# Patient Record
Sex: Female | Born: 1965 | Race: Black or African American | Hispanic: No | Marital: Single | Smoking: Never smoker
Health system: Southern US, Community
[De-identification: ages and names within clinical notes are randomized; demographics above are authoritative.]

## PROBLEM LIST (undated history)

## (undated) DIAGNOSIS — I341 Nonrheumatic mitral (valve) prolapse: Secondary | ICD-10-CM

## (undated) DIAGNOSIS — K625 Hemorrhage of anus and rectum: Secondary | ICD-10-CM

## (undated) DIAGNOSIS — I10 Essential (primary) hypertension: Secondary | ICD-10-CM

## (undated) DIAGNOSIS — Q613 Polycystic kidney, unspecified: Secondary | ICD-10-CM

## (undated) DIAGNOSIS — N189 Chronic kidney disease, unspecified: Secondary | ICD-10-CM

## (undated) DIAGNOSIS — D219 Benign neoplasm of connective and other soft tissue, unspecified: Secondary | ICD-10-CM

## (undated) DIAGNOSIS — R011 Cardiac murmur, unspecified: Secondary | ICD-10-CM

## (undated) DIAGNOSIS — N289 Disorder of kidney and ureter, unspecified: Secondary | ICD-10-CM

## (undated) HISTORY — PX: CERVIX SURGERY: SHX593

## (undated) HISTORY — DX: Hemorrhage of anus and rectum: K62.5

## (undated) HISTORY — PX: TONSILLECTOMY: SUR1361

## (undated) HISTORY — DX: Chronic kidney disease, unspecified: N18.9

## (undated) HISTORY — DX: Nonrheumatic mitral (valve) prolapse: I34.1

## (undated) HISTORY — PX: APPENDECTOMY: SHX54

## (undated) HISTORY — DX: Disorder of kidney and ureter, unspecified: N28.9

## (undated) HISTORY — PX: DILATION AND CURETTAGE OF UTERUS: SHX78

## (undated) HISTORY — DX: Benign neoplasm of connective and other soft tissue, unspecified: D21.9

## (undated) HISTORY — DX: Cardiac murmur, unspecified: R01.1

---

## 2015-11-20 ENCOUNTER — Emergency Department (HOSPITAL_BASED_OUTPATIENT_CLINIC_OR_DEPARTMENT_OTHER)
Admission: EM | Admit: 2015-11-20 | Discharge: 2015-11-20 | Disposition: A | Discharge status: Home / Self Care | Payer: Medicare Other | Attending: Emergency Medicine | Admitting: Emergency Medicine

## 2015-11-20 ENCOUNTER — Encounter (HOSPITAL_BASED_OUTPATIENT_CLINIC_OR_DEPARTMENT_OTHER): Payer: Self-pay | Admitting: *Deleted

## 2015-11-20 DIAGNOSIS — I10 Essential (primary) hypertension: Secondary | ICD-10-CM | POA: Insufficient documentation

## 2015-11-20 DIAGNOSIS — H43393 Other vitreous opacities, bilateral: Secondary | ICD-10-CM

## 2015-11-20 DIAGNOSIS — R42 Dizziness and giddiness: Secondary | ICD-10-CM

## 2015-11-20 DIAGNOSIS — Z79899 Other long term (current) drug therapy: Secondary | ICD-10-CM | POA: Diagnosis not present

## 2015-11-20 HISTORY — DX: Essential (primary) hypertension: I10

## 2015-11-20 HISTORY — DX: Polycystic kidney, unspecified: Q61.3

## 2015-11-20 LAB — PREGNANCY, URINE: PREG TEST UR: NEGATIVE

## 2015-11-20 LAB — URINALYSIS, ROUTINE W REFLEX MICROSCOPIC
Bilirubin Urine: NEGATIVE
Glucose, UA: NEGATIVE mg/dL
HGB URINE DIPSTICK: NEGATIVE
Ketones, ur: NEGATIVE mg/dL
Leukocytes, UA: NEGATIVE
Nitrite: NEGATIVE
PROTEIN: NEGATIVE mg/dL
Specific Gravity, Urine: 1.012 (ref 1.005–1.030)
pH: 6.5 (ref 5.0–8.0)

## 2015-11-20 LAB — CBC WITH DIFFERENTIAL/PLATELET
BASOS ABS: 0 10*3/uL (ref 0.0–0.1)
BASOS PCT: 1 %
EOS PCT: 8 %
Eosinophils Absolute: 0.6 10*3/uL (ref 0.0–0.7)
HEMATOCRIT: 34.3 % — AB (ref 36.0–46.0)
Hemoglobin: 11.9 g/dL — ABNORMAL LOW (ref 12.0–15.0)
Lymphocytes Relative: 36 %
Lymphs Abs: 2.9 10*3/uL (ref 0.7–4.0)
MCH: 29.7 pg (ref 26.0–34.0)
MCHC: 34.7 g/dL (ref 30.0–36.0)
MCV: 85.5 fL (ref 78.0–100.0)
MONO ABS: 0.7 10*3/uL (ref 0.1–1.0)
Monocytes Relative: 9 %
NEUTROS ABS: 3.8 10*3/uL (ref 1.7–7.7)
Neutrophils Relative %: 46 %
PLATELETS: 313 10*3/uL (ref 150–400)
RBC: 4.01 MIL/uL (ref 3.87–5.11)
RDW: 12.7 % (ref 11.5–15.5)
WBC: 8 10*3/uL (ref 4.0–10.5)

## 2015-11-20 LAB — BASIC METABOLIC PANEL
ANION GAP: 6 (ref 5–15)
BUN: 8 mg/dL (ref 6–20)
CALCIUM: 9.7 mg/dL (ref 8.9–10.3)
CO2: 27 mmol/L (ref 22–32)
Chloride: 103 mmol/L (ref 101–111)
Creatinine, Ser: 0.57 mg/dL (ref 0.44–1.00)
Glucose, Bld: 99 mg/dL (ref 65–99)
Potassium: 3.9 mmol/L (ref 3.5–5.1)
SODIUM: 136 mmol/L (ref 135–145)

## 2015-11-20 NOTE — ED Provider Notes (Signed)
CSN: [id number]     Arrival date & time 11/20/15  1454 History   First MD Initiated Contact with Patient 11/20/15 1504     Chief Complaint  Patient presents with  . Dizziness     (Consider location/radiation/quality/duration/timing/severity/associated sxs/prior Treatment) Patient is a 50 y.o. female presenting with dizziness. The history is provided by the patient.  Dizziness Quality:  Lightheadedness Severity:  Moderate Onset quality:  Gradual Duration:  5 days Timing:  Constant Progression:  Unchanged Chronicity:  New Context: not with loss of consciousness, not with physical activity and not when standing up   Relieved by:  Nothing Worsened by:  Nothing Ineffective treatments:  None tried Associated symptoms: vision changes (small floater in right eye then in left eye both resolved)   Associated symptoms: no palpitations and no shortness of breath     Past Medical History  Diagnosis Date  . Hypertension   . PKD (polycystic kidney disease)    Past Surgical History  Procedure Laterality Date  . Tonsillectomy    . Appendectomy     No family history on file. Social History  Substance Use Topics  . Smoking status: Never Smoker   . Smokeless tobacco: None  . Alcohol Use: Yes     Comment: occ   OB History    No data available     Review of Systems  Respiratory: Negative for shortness of breath.   Cardiovascular: Negative for palpitations.  Neurological: Positive for dizziness.  All other systems reviewed and are negative.     Allergies  Penicillins and Sulfa antibiotics  Home Medications   Prior to Admission medications   Medication Sig Start Date End Date Taking? Authorizing Provider  LISINOPRIL PO Take by mouth.   Yes Historical Provider, MD   BP 155/106 mmHg  Pulse 82  Temp(Src) 98.7 F (37.1 C) (Oral)  Resp 20  Ht 5\' 5"  (1.651 m)  Wt 158 lb (71.668 kg)  BMI 26.29 kg/m2  SpO2 100%  LMP 11/07/2015 Physical Exam  Constitutional: She is  oriented to person, place, and time. She appears well-developed and well-nourished. No distress.  HENT:  Head: Normocephalic.  Eyes: Conjunctivae are normal.  Fundoscopic exam:      The right eye shows no hemorrhage and no papilledema.       The left eye shows no hemorrhage and no papilledema.  Neck: Neck supple. No tracheal deviation present.  Cardiovascular: Normal rate and regular rhythm.   Pulmonary/Chest: Effort normal and breath sounds normal. No respiratory distress.  Abdominal: Soft. She exhibits no distension.  Neurological: She is alert and oriented to person, place, and time. She has normal strength. No cranial nerve deficit. Coordination and gait normal. GCS eye subscore is 4. GCS verbal subscore is 5. GCS motor subscore is 6.  Normal finger to nose testing and rapid alternating movement   Skin: Skin is warm and dry.  Psychiatric: She has a normal mood and affect.  Vitals reviewed.   ED Course  Procedures (including critical care time) Labs Review Labs Reviewed  CBC WITH DIFFERENTIAL/PLATELET - Abnormal; Notable for the following:    Hemoglobin 11.9 (*)    HCT 34.3 (*)    All other components within normal limits  URINALYSIS, ROUTINE W REFLEX MICROSCOPIC (NOT AT [facility])  PREGNANCY, URINE  BASIC METABOLIC PANEL    Imaging Review No results found. I have personally reviewed and evaluated these images and lab results as part of my medical decision-making.   EKG Interpretation None  MDM   Final diagnoses:  Lightheadedness  Floaters in visual field, bilateral    50 year old female presents with intermittent lightheadedness and noting a floater in her right visual field followed by a floater in her left visual field which have both resolved. No difficulty ambulating. No neurologic deficits here on exam. She admits to not staying well-hydrated recently in the heat. No significant hematologic or metabolic abnormalities to explain symptoms. No evidence of urinary  tract infection or other signs of infection. She is well-appearing and is able to be discharged. Doubt retinal detachment as she has not had visual loss at any point. Suspect possible mild dehydration is related to her symptoms and floaters are naturally occurring phenomenon. Plan to follow up with PCP as needed and return precautions discussed for worsening or new concerning symptoms.     Leo Grosser, MD 11/20/15 [phone]

## 2015-11-20 NOTE — ED Notes (Signed)
Lightheaded x 5 days. Nausea. States she has been feeling silver spots in front of her eyes.

## 2015-11-20 NOTE — ED Notes (Signed)
MD at bedside. 

## 2015-11-20 NOTE — Discharge Instructions (Signed)

## 2016-02-26 ENCOUNTER — Encounter (HOSPITAL_COMMUNITY): Payer: Self-pay | Admitting: Emergency Medicine

## 2016-02-26 ENCOUNTER — Emergency Department (HOSPITAL_COMMUNITY)
Admission: EM | Admit: 2016-02-26 | Discharge: 2016-02-27 | Disposition: A | Discharge status: Home / Self Care | Payer: PRIVATE HEALTH INSURANCE | Attending: Emergency Medicine | Admitting: Emergency Medicine

## 2016-02-26 DIAGNOSIS — M5440 Lumbago with sciatica, unspecified side: Secondary | ICD-10-CM

## 2016-02-26 DIAGNOSIS — M5442 Lumbago with sciatica, left side: Secondary | ICD-10-CM | POA: Diagnosis not present

## 2016-02-26 DIAGNOSIS — Y929 Unspecified place or not applicable: Secondary | ICD-10-CM | POA: Diagnosis not present

## 2016-02-26 DIAGNOSIS — M62830 Muscle spasm of back: Secondary | ICD-10-CM | POA: Insufficient documentation

## 2016-02-26 DIAGNOSIS — Y990 Civilian activity done for income or pay: Secondary | ICD-10-CM | POA: Insufficient documentation

## 2016-02-26 DIAGNOSIS — X500XXA Overexertion from strenuous movement or load, initial encounter: Secondary | ICD-10-CM | POA: Insufficient documentation

## 2016-02-26 DIAGNOSIS — Y9389 Activity, other specified: Secondary | ICD-10-CM | POA: Insufficient documentation

## 2016-02-26 DIAGNOSIS — M545 Low back pain: Secondary | ICD-10-CM | POA: Diagnosis present

## 2016-02-26 DIAGNOSIS — I10 Essential (primary) hypertension: Secondary | ICD-10-CM | POA: Diagnosis not present

## 2016-02-26 MED ORDER — HYDROCODONE-ACETAMINOPHEN 5-325 MG PO TABS
1.0000 | ORAL_TABLET | Freq: Once | ORAL | Status: DC
  Filled 2016-02-26: qty 1

## 2016-02-26 MED ORDER — CYCLOBENZAPRINE HCL 10 MG PO TABS
10.0000 mg | ORAL_TABLET | Freq: Once | ORAL | Status: DC
  Filled 2016-02-26: qty 1

## 2016-02-26 NOTE — ED Notes (Signed)
NP Janit Bern states patient may go to CT.

## 2016-02-26 NOTE — ED Triage Notes (Signed)
Pt. injured her back ( at work ) while pulling an equipment this evening , reports low back pain " heaviness" radiating to left leg / posterior neck pain . Ambulatory / denies dizziness or SOB .

## 2016-02-26 NOTE — ED Provider Notes (Signed)
[place] DEPT Provider Note   CSN: [id number] Arrival date & time: 02/26/16  2007 By signing my name below, I, Caitlin Carr, attest that this documentation has been prepared under the direction and in the presence of [facility] [place]. Electronically Signed: Georgette Carr, ED Scribe. 02/26/16. 10:09 PM.  History   Chief Complaint Chief Complaint  Patient presents with  . Back Injury   HPI Comments: Caitlin Carr is a 50 y.o. female with no pertinent PMHx, who presents to the Emergency Department complaining of pressurized and throbbing, 8/10 lower back pain radiating to posterior neck and left leg s/p mechanical injury ~7pm tonight. She notes she has associated urinary urgency. Pt states she was pulling equipment at work when she heard a "snap" and felt her back twitch. Pt states pain is exacerbated when ambulating and staying still. She has not tried any OTC medications PTA. Pt denies any additional injuries. She further denies urinary and bowel incontinence, dizziness, shortness of breath, fever, chills, or any other associated symptoms.   The history is provided by the patient. No language interpreter was used.  Back Pain   This is a new problem. The current episode started 3 to 5 hours ago. The problem occurs constantly. The problem has not changed since onset.The pain is associated with a recent injury. The pain is present in the lumbar spine. Quality: pressurized and throbbing. The pain radiates to the left thigh, left knee and left foot. The pain is at a severity of 8/10. The pain is moderate. The symptoms are aggravated by certain positions and bending. Associated symptoms include leg pain. Pertinent negatives include no fever, no bowel incontinence and no bladder incontinence. She has tried nothing for the symptoms.    Past Medical History:  Diagnosis Date  . Hypertension   . PKD (polycystic kidney disease)     There are no active problems to display for this patient.   Past  Surgical History:  Procedure Laterality Date  . APPENDECTOMY    . DILATION AND CURETTAGE OF UTERUS    . TONSILLECTOMY      OB History    No data available       Home Medications    Prior to Admission medications   Medication Sig Start Date End Date Taking? Authorizing Provider  cyclobenzaprine (FLEXERIL) 5 MG tablet Take 1 tablet (5 mg total) by mouth 3 (three) times daily as needed for muscle spasms. 02/27/16   Luiscarlos Kaczmarczyk Bunnie Pion, NP  LISINOPRIL PO Take by mouth.    Historical Provider, MD  naproxen (NAPROSYN) 375 MG tablet Take 1 tablet (375 mg total) by mouth 2 (two) times daily. 02/27/16   Maigen Mozingo Bunnie Pion, NP    Family History No family history on file.  Social History Social History  Substance Use Topics  . Smoking status: Never Smoker  . Smokeless tobacco: Never Used  . Alcohol use Yes     Comment: occ     Allergies   Penicillins and Sulfa antibiotics   Review of Systems Review of Systems  Constitutional: Negative for chills and fever.  Respiratory: Negative for shortness of breath.   Gastrointestinal: Negative for bowel incontinence.  Genitourinary: Positive for urgency. Negative for bladder incontinence.  Musculoskeletal: Positive for back pain, myalgias and neck pain.  Neurological: Negative for dizziness.  All other systems reviewed and are negative.    Physical Exam Updated Vital Signs BP 147/94 (BP Location: Left Arm)   Pulse 84   Temp 98.2 F (36.8 C) (Oral)  Resp 18   Ht 5\' 5"  (1.651 m)   Wt 69.9 kg   LMP 02/15/2016 Comment: Tubal  SpO2 100%   BMI 25.63 kg/m   Physical Exam  Constitutional: She is oriented to person, place, and time. She appears well-developed and well-nourished. No distress.  HENT:  Head: Normocephalic and atraumatic.  Right Ear: Tympanic membrane normal.  Left Ear: Tympanic membrane normal.  Nose: Nose normal.  Mouth/Throat: Uvula is midline, oropharynx is clear and moist and mucous membranes are normal.  Eyes:  Conjunctivae and EOM are normal.  Neck: Normal range of motion. Neck supple.  Cardiovascular: Normal rate and regular rhythm.   Pulmonary/Chest: Effort normal. No respiratory distress. She has no wheezes. She has no rales.  Abdominal: Soft. Bowel sounds are normal. She exhibits no distension. There is no tenderness.  Musculoskeletal: Normal range of motion. She exhibits tenderness.       Lumbar back: She exhibits tenderness, pain and spasm. She exhibits normal pulse.  Tender over lumbar spine and left sciatic nerve. Radial pulses 2+, adequate circulation. Hips are equal. Distal pulses are 2+. Reflexes are 2+.  Neurological: She is alert and oriented to person, place, and time. She has normal strength. No cranial nerve deficit or sensory deficit. Gait normal.  Reflex Scores:      Bicep reflexes are 2+ on the right side and 2+ on the left side.      Brachioradialis reflexes are 2+ on the right side and 2+ on the left side.      Patellar reflexes are 2+ on the right side and 2+ on the left side.      Achilles reflexes are 2+ on the right side and 2+ on the left side. Skin: Skin is warm and dry.  Psychiatric: She has a normal mood and affect. Her behavior is normal.  Nursing note and vitals reviewed.    ED Treatments / Results  DIAGNOSTIC STUDIES: Oxygen Saturation is 100% on RA, normal by my interpretation.    COORDINATION OF CARE: 10:05 PM Discussed treatment plan with pt at bedside which includes muscle relaxant and ice and pt agreed to plan.  Labs (all labs ordered are listed, but only abnormal results are displayed) Labs Reviewed - No data to display  Radiology Ct Lumbar Spine Wo Contrast  Result Date: 02/27/2016 CLINICAL DATA:  Back pain after lifting injury EXAM: CT LUMBAR SPINE WITHOUT CONTRAST TECHNIQUE: Multidetector CT imaging of the lumbar spine was performed without intravenous contrast administration. Multiplanar CT image reconstructions were also generated. COMPARISON:   None. FINDINGS: Segmentation: 5 lumbar type vertebrae. Alignment: Normal. Vertebrae: No acute fracture or focal pathologic process. Mild facet hypertrophy and arthropathy at L4-5 bilaterally with small amount of vacuum within the facet joints. Paraspinal and other soft tissues: No retroperitoneal lymphadenopathy. No aortic aneurysm. Cysts of the interpolar and lower right kidney measuring between 9 mm and 27 mm. No obstructive uropathy. Disc levels: T12-L1 through L2-3, no focal disc herniation, central canal stenosis or significant neural foraminal narrowing. At L3-4, lateral disc bulges minimally narrowing both neural foramina without contacting the exiting nerve roots. No canal stenosis. At L4-5, mild concentric disc bulge, facet hypertrophy and arthropathy without significant central canal stenosis. At L5-S1, mild to moderate concentric posterior disc bulge without central canal stenosis or significant neural foraminal narrowing. IMPRESSION: L3 through S1 disc bulges as above described without focal disc herniation, significant neural foraminal narrowing or central canal stenosis. No acute osseous abnormality. Right-sided renal cysts.  P Electronically Signed  By: Ashley Royalty M.D.   On: 02/27/2016 01:12    Procedures Procedures (including critical care time)  Medications Ordered in ED Medications  cyclobenzaprine (FLEXERIL) tablet 10 mg (0 mg Oral Hold 02/26/16 2220)  HYDROcodone-acetaminophen (NORCO/VICODIN) 5-325 MG per tablet 1 tablet (0 tablets Oral Hold 02/26/16 2220)     Initial Impression / Assessment and Plan / ED Course  I have reviewed the triage vital signs and the nursing notes.  Pertinent  imaging results that were available during my care of the patient were reviewed by me and considered in my medical decision making (see chart for details).  Clinical Course  I discussed this case with Dr. Christy Gentles and reviewed the CT results. Will treat for pain and inflammation and muscle  spasm. Patient to f/u with [facility] tomorrow. She will return here for worsening symptoms. Stable for d/c without neuro deficits. Discussed CT results with the patient and plan of care.   Final Clinical Impressions(s) / ED Diagnoses   Final diagnoses:  Acute midline low back pain with sciatica, sciatica laterality unspecified  Back muscle spasm    New Prescriptions New Prescriptions   CYCLOBENZAPRINE (FLEXERIL) 5 MG TABLET    Take 1 tablet (5 mg total) by mouth 3 (three) times daily as needed for muscle spasms.   NAPROXEN (NAPROSYN) 375 MG TABLET    Take 1 tablet (375 mg total) by mouth 2 (two) times daily.   I personally performed the services described in this documentation, which was scribed in my presence. The recorded information has been reviewed and is accurate.     [address] [place], [place] 02/27/16 [id number]    Ripley Fraise, MD 02/27/16 [phone]

## 2016-02-26 NOTE — ED Notes (Signed)
CT notified that patient has had a tubal ligation and is ready for CT.

## 2016-02-27 ENCOUNTER — Emergency Department (HOSPITAL_COMMUNITY): Payer: PRIVATE HEALTH INSURANCE

## 2016-02-27 MED ORDER — NAPROXEN 375 MG PO TABS: 375.0000 mg | ORAL_TABLET | Freq: Two times a day (BID) | ORAL | 0 refills | Status: DC

## 2016-02-27 MED ORDER — CYCLOBENZAPRINE HCL 5 MG PO TABS
5.0000 mg | ORAL_TABLET | Freq: Three times a day (TID) | ORAL | 0 refills | Status: AC | PRN
Start: 2016-02-27 — End: ?

## 2016-02-27 NOTE — Discharge Instructions (Signed)
Call [facility] tomorrow for follow up. Return here as needed for worsening symptoms.

## 2016-02-27 NOTE — ED Notes (Signed)
Patient given [facility] coupon code [id number].

## 2016-04-03 ENCOUNTER — Emergency Department (HOSPITAL_COMMUNITY): Payer: Medicare Other

## 2016-04-03 ENCOUNTER — Emergency Department (HOSPITAL_COMMUNITY)
Admission: EM | Admit: 2016-04-03 | Discharge: 2016-04-03 | Disposition: A | Discharge status: Home / Self Care | Payer: Medicare Other | Attending: Emergency Medicine | Admitting: Emergency Medicine

## 2016-04-03 ENCOUNTER — Encounter (HOSPITAL_COMMUNITY): Payer: Self-pay | Admitting: Emergency Medicine

## 2016-04-03 DIAGNOSIS — Y999 Unspecified external cause status: Secondary | ICD-10-CM | POA: Insufficient documentation

## 2016-04-03 DIAGNOSIS — Y939 Activity, unspecified: Secondary | ICD-10-CM | POA: Diagnosis not present

## 2016-04-03 DIAGNOSIS — Y929 Unspecified place or not applicable: Secondary | ICD-10-CM | POA: Diagnosis not present

## 2016-04-03 DIAGNOSIS — S9031XA Contusion of right foot, initial encounter: Secondary | ICD-10-CM | POA: Diagnosis not present

## 2016-04-03 DIAGNOSIS — Z79899 Other long term (current) drug therapy: Secondary | ICD-10-CM | POA: Diagnosis not present

## 2016-04-03 DIAGNOSIS — S99921A Unspecified injury of right foot, initial encounter: Secondary | ICD-10-CM | POA: Diagnosis present

## 2016-04-03 DIAGNOSIS — I10 Essential (primary) hypertension: Secondary | ICD-10-CM | POA: Diagnosis not present

## 2016-04-03 HISTORY — DX: Nonrheumatic mitral (valve) prolapse: I34.1

## 2016-04-03 MED ORDER — TRAMADOL HCL 50 MG PO TABS: 50.0000 mg | ORAL_TABLET | Freq: Four times a day (QID) | ORAL | 0 refills | Status: AC | PRN
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
## 2016-04-03 NOTE — Progress Notes (Signed)
Orthopedic Tech Progress Note Patient Details:  Caitlin Carr 1965-11-02 [id number]  Ortho Devices Type of Ortho Device: ASO, Crutches Ortho Device/Splint Location: rle Ortho Device/Splint Interventions: Application   Santanna Olenik 04/03/2016, 9:14 AM

## 2016-04-03 NOTE — ED Notes (Signed)
Pt verbalizes understanding of discharge instructions. Ambulatory with crutches at discharge. NAD. A/o x4. VSS.

## 2016-04-03 NOTE — Discharge Instructions (Signed)
Get help right away if: °Your foot is numb or tingling. °Your foot or toes are swollen. °Your foot or toes turn white or blue. °You have warmth and redness along your foot. °
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
## 2016-04-05 ENCOUNTER — Other Ambulatory Visit: Payer: Self-pay | Admitting: Nephrology

## 2016-04-05 DIAGNOSIS — N189 Chronic kidney disease, unspecified: Secondary | ICD-10-CM

## 2016-04-05 DIAGNOSIS — I159 Secondary hypertension, unspecified: Secondary | ICD-10-CM

## 2016-04-16 ENCOUNTER — Ambulatory Visit
Admission: RE | Admit: 2016-04-16 | Discharge: 2016-04-16 | Disposition: A | Discharge status: Home / Self Care | Payer: Medicare Other | Source: Ambulatory Visit | Attending: Nephrology | Admitting: Nephrology

## 2016-04-16 ENCOUNTER — Emergency Department (HOSPITAL_COMMUNITY)
Admission: EM | Admit: 2016-04-16 | Discharge: 2016-04-16 | Disposition: A | Discharge status: Home / Self Care | Payer: Medicare Other | Attending: Emergency Medicine | Admitting: Emergency Medicine

## 2016-04-16 ENCOUNTER — Encounter (HOSPITAL_COMMUNITY): Payer: Self-pay | Admitting: Emergency Medicine

## 2016-04-16 ENCOUNTER — Emergency Department (HOSPITAL_COMMUNITY): Payer: Medicare Other

## 2016-04-16 DIAGNOSIS — R079 Chest pain, unspecified: Secondary | ICD-10-CM | POA: Diagnosis present

## 2016-04-16 DIAGNOSIS — N189 Chronic kidney disease, unspecified: Secondary | ICD-10-CM

## 2016-04-16 DIAGNOSIS — I159 Secondary hypertension, unspecified: Secondary | ICD-10-CM

## 2016-04-16 DIAGNOSIS — I10 Essential (primary) hypertension: Secondary | ICD-10-CM | POA: Insufficient documentation

## 2016-04-16 LAB — BASIC METABOLIC PANEL
Anion gap: 6 (ref 5–15)
BUN: 5 mg/dL — ABNORMAL LOW (ref 6–20)
CALCIUM: 9.3 mg/dL (ref 8.9–10.3)
CO2: 26 mmol/L (ref 22–32)
CREATININE: 0.59 mg/dL (ref 0.44–1.00)
Chloride: 104 mmol/L (ref 101–111)
Glucose, Bld: 99 mg/dL (ref 65–99)
Potassium: 3.9 mmol/L (ref 3.5–5.1)
SODIUM: 136 mmol/L (ref 135–145)

## 2016-04-16 LAB — CBC
HCT: 33.2 % — ABNORMAL LOW (ref 36.0–46.0)
Hemoglobin: 11.3 g/dL — ABNORMAL LOW (ref 12.0–15.0)
MCH: 28.4 pg (ref 26.0–34.0)
MCHC: 34 g/dL (ref 30.0–36.0)
MCV: 83.4 fL (ref 78.0–100.0)
Platelets: 336 10*3/uL (ref 150–400)
RBC: 3.98 MIL/uL (ref 3.87–5.11)
RDW: 14.3 % (ref 11.5–15.5)
WBC: 8.9 10*3/uL (ref 4.0–10.5)

## 2016-04-16 LAB — I-STAT TROPONIN, ED: Troponin i, poc: 0.01 ng/mL (ref 0.00–0.08)

## 2016-04-16 MED ORDER — IBUPROFEN 600 MG PO TABS: 600.0000 mg | ORAL_TABLET | Freq: Four times a day (QID) | ORAL | 0 refills | Status: AC | PRN

## 2016-04-16 NOTE — ED Triage Notes (Signed)
Patient reports intermittent central chest pain with mild SOB and dry cough onset 2 days ago , denies emesis or diaphoresis . No fever or chills .

## 2016-04-16 NOTE — ED Provider Notes (Signed)
[facility] DEPT Provider Note   CSN: [id number] Arrival date & time: 04/16/16  2032     History   Chief Complaint Chief Complaint  Patient presents with  . Chest Pain    HPI Caitlin Carr is a 50 y.o. female.  HPI  Pt with hx of hypertension and MVP presents with c/o intermittent chest pain that has been ongoing for the past 2 days.  She describes the pain as a sharp aching- sometimes worse with different position.  Mild dry cough associated.  No difficulty breathing.  No fever/chills.  No leg swelling. No diaphoresis, nausea or radiation of pain.  No hx of DVT/PE, no recent travel/trauma/surgery.  Pt states she recently had a stress test that was negative- in [place]- she has recently moved and has a referral made to see cardiology to follow her for her mitral valve prolapse.  She has not had any syncope.  There are no other associated systemic symptoms, there are no other alleviating or modifying factors.   Past Medical History:  Diagnosis Date  . Hypertension   . Mitral valve prolapse   . PKD (polycystic kidney disease)     There are no active problems to display for this patient.   Past Surgical History:  Procedure Laterality Date  . APPENDECTOMY    . CERVIX SURGERY    . DILATION AND CURETTAGE OF UTERUS    . TONSILLECTOMY      OB History    No data available       Home Medications    Prior to Admission medications   Medication Sig Start Date End Date Taking? Authorizing Provider  lisinopril (PRINIVIL,ZESTRIL) 2.5 MG tablet Take 2.5 mg by mouth daily.   Yes Historical Provider, MD  Multiple Vitamin (MULTIVITAMIN WITH MINERALS) TABS tablet Take 1 tablet by mouth daily.   Yes Historical Provider, MD  cyclobenzaprine (FLEXERIL) 5 MG tablet Take 1 tablet (5 mg total) by mouth 3 (three) times daily as needed for muscle spasms. Patient not taking: Reported on 04/16/2016 02/27/16   Ashley Murrain, NP  ibuprofen (ADVIL,MOTRIN) 600 MG tablet Take 1 tablet (600 mg  total) by mouth every 6 (six) hours as needed. 04/16/16   Alfonzo Beers, MD  traMADol (ULTRAM) 50 MG tablet Take 1 tablet (50 mg total) by mouth every 6 (six) hours as needed for severe pain. Patient not taking: Reported on 04/16/2016 04/03/16   Margarita Mail, PA-C    Family History No family history on file.  Social History Social History  Substance Use Topics  . Smoking status: Never Smoker  . Smokeless tobacco: Never Used  . Alcohol use Yes     Comment: occ     Allergies   Penicillins and Sulfa antibiotics   Review of Systems Review of Systems  ROS reviewed and all otherwise negative except for mentioned in HPI   Physical Exam Updated Vital Signs BP 127/80   Pulse 70   Temp 98.6 F (37 C) (Oral)   Resp 12   Ht 5\' 5"  (1.651 m)   Wt 73.3 kg   LMP 04/02/2016 (Approximate)   SpO2 100%   BMI 26.88 kg/m  Vitals reviewed Physical Exam Physical Examination: General appearance - alert, well appearing, and in no distress Mental status - alert, oriented to person, place, and time Eyes -no conjunctival injection, no scleral icterus Chest - clear to auscultation, no wheezes, rales or rhonchi, symmetric air entry Heart - normal rate, regular rhythm, normal S1, S2, no murmurs, rubs,  clicks or gallops Abdomen - soft, nontender, nondistended, no masses or organomegaly Neurological - alert, oriented, normal speech Extremities - peripheral pulses normal, no pedal edema, no clubbing or cyanosis Skin - normal coloration and turgor, no rashes  ED Treatments / Results  Labs (all labs ordered are listed, but only abnormal results are displayed) Labs Reviewed  BASIC METABOLIC PANEL - Abnormal; Notable for the following:       Result Value   BUN 5 (*)    All other components within normal limits  CBC - Abnormal; Notable for the following:    Hemoglobin 11.3 (*)    HCT 33.2 (*)    All other components within normal limits  I-STAT TROPOININ, ED    EKG  EKG  Interpretation  Date/Time:  Friday April 16 2016 20:41:17 EST Ventricular Rate:  90 PR Interval:  184 QRS Duration: 90 QT Interval:  386 QTC Calculation: 472 R Axis:   77 Text Interpretation:  Normal sinus rhythm Normal ECG No old tracing to compare Confirmed by Canary Brim  MD, Harlo Jaso [phone]) on 04/16/2016 10:27:16 PM Also confirmed by Canary Brim  MD, [place] [phone]), editor [place], Joelene Millin [phone])  on 04/17/2016 10:42:08 AM       Radiology Dg Chest 2 View  Result Date: 04/16/2016 CLINICAL DATA:  Chest pain for 2 days. EXAM: CHEST  2 VIEW COMPARISON:  None. FINDINGS: The cardiomediastinal contours are normal. The lungs are clear. Pulmonary vasculature is normal. No consolidation, pleural effusion, or pneumothorax. No acute osseous abnormalities are seen. 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 IMPRESSION: No active cardiopulmonary disease. Electronically Signed   By: Jeb Levering M.D.   On: 04/16/2016 21:41   US Renal  Result Date: 04/16/2016 CLINICAL DATA:  Secondary hypertension. Chronic renal disease. Right flank pain x8 months. Multiple urinary tract infections. No hematuria. EXAM: RENAL / URINARY TRACT ULTRASOUND COMPLETE COMPARISON:  Lumbar spine CT from 02/27/2016 FINDINGS: Right Kidney: Length: 11.2 cm. Echogenicity within normal limits. Several simple and complex cysts are noted within anechoic simple appearing cyst measuring 1.8 x 1.2 x 1.8 cm in the lower pole, thinly septated complex medial 2.5 x 2.4 x 2.6 cm cyst and a partially calcified non suspicious 0.8 x 0.7 x 0.7 cm in the upper pole. No nephrolithiasis. No solid-appearing masses. Left Kidney: Length: 11.7 cm. Echogenicity within normal limits. No solid-appearing mass or obstructive uropathy. No nephrolithiasis. Upper pole simple 0.9 x 1.3 x 0.8 cm. Bladder: Appears normal for degree of bladder distention. IMPRESSION: Bilateral simple appearing cysts with a few benign-appearing complex  thinly septated or calcified cysts on the right. No significant loss cortical-medullary distinction nor renal scarring. No obstructive uropathy or nephrolithiasis. Electronically Signed   By: Ashley Royalty M.D.   On: 04/16/2016 15:17    Procedures Procedures (including critical care time)  Medications Ordered in ED Medications - No data to display   Initial Impression / Assessment and Plan / ED Course  I have reviewed the triage vital signs and the nursing notes.  Pertinent labs & imaging results that were available during my care of the patient were reviewed by me and considered in my medical decision making (see chart for details).  Clinical Course     Pt presenting with c/o chest pain intermittently.  Her exam, labs, EKG and CXR are reassuring.  She is pERC negative so doubt PE.  Her heart score is 1.  Low risk for ACS. Pt advised to take  ibuprofen scheduled for several days to help with inflammation.  Pt has already been referred to cardiology to follow with them for her mitral valve prolapse.  Discharged with strict return precautions.  Pt agreeable with plan.  Final Clinical Impressions(s) / ED Diagnoses   Final diagnoses:  Chest pain, unspecified type    New Prescriptions Discharge Medication List as of 04/16/2016 11:32 PM    START taking these medications   Details  ibuprofen (ADVIL,MOTRIN) 600 MG tablet Take 1 tablet (600 mg total) by mouth every 6 (six) hours as needed., Starting Fri 04/16/2016, Print         Alfonzo Beers, MD 04/17/16 2303

## 2016-04-16 NOTE — Discharge Instructions (Signed)
Return to the ED with any concerns including difficulty breathing, worsening pain, leg swelling, fainting, or any other alarming symptoms

## 2016-04-27 DIAGNOSIS — R002 Palpitations: Secondary | ICD-10-CM | POA: Insufficient documentation

## 2016-05-17 ENCOUNTER — Ambulatory Visit: Payer: Medicare Other | Admitting: Cardiology

## 2016-06-01 ENCOUNTER — Encounter: Payer: Self-pay | Admitting: Cardiology

## 2016-06-23 ENCOUNTER — Ambulatory Visit: Payer: Medicare Other | Admitting: Cardiology

## 2017-04-11 IMAGING — US US RENAL
1 series · 14 of 25 positions shown · non-contrast
Comparison: Lumbar spine CT from 02/27/2016

CLINICAL DATA: Secondary hypertension. Chronic renal disease. Right
flank pain x8 months. Multiple urinary tract infections. No
hematuria.

EXAM:
RENAL / URINARY TRACT ULTRASOUND COMPLETE

[Series 1: us renal · 0.23mm/px · 14 of 50 slices shown]
[im 1/50]
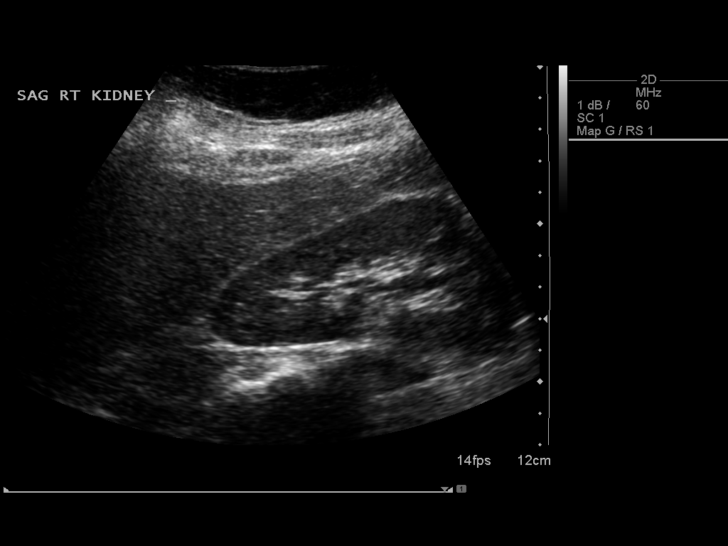
[im 5/50]
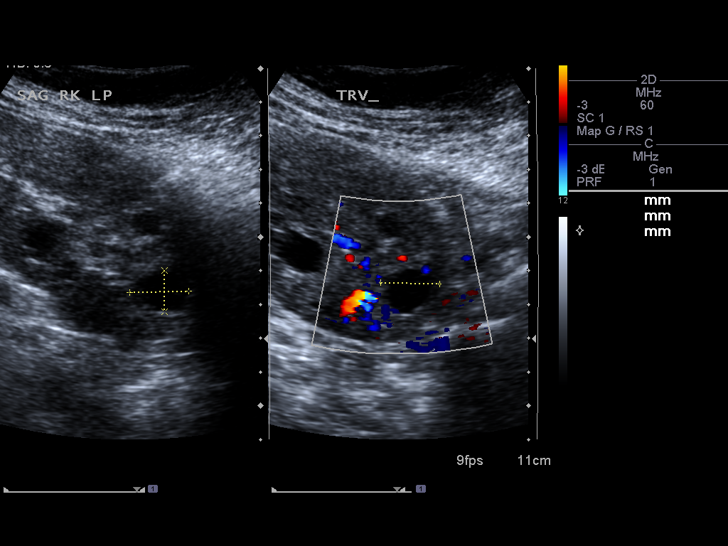
[im 9/50]
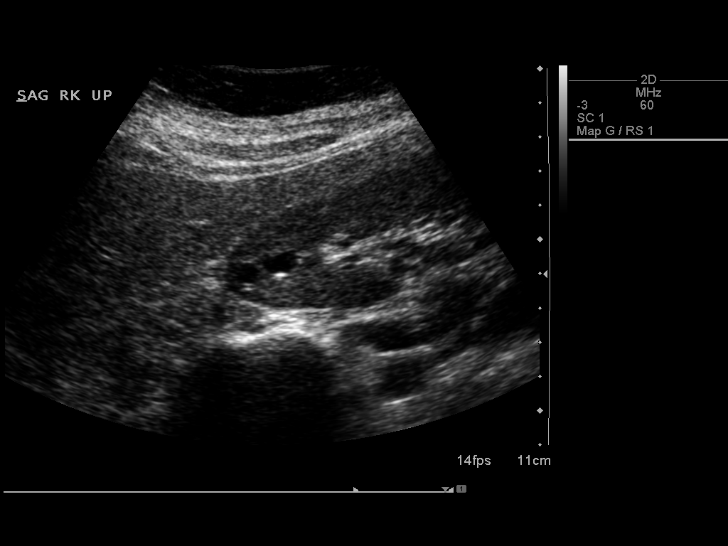
[im 13/50]
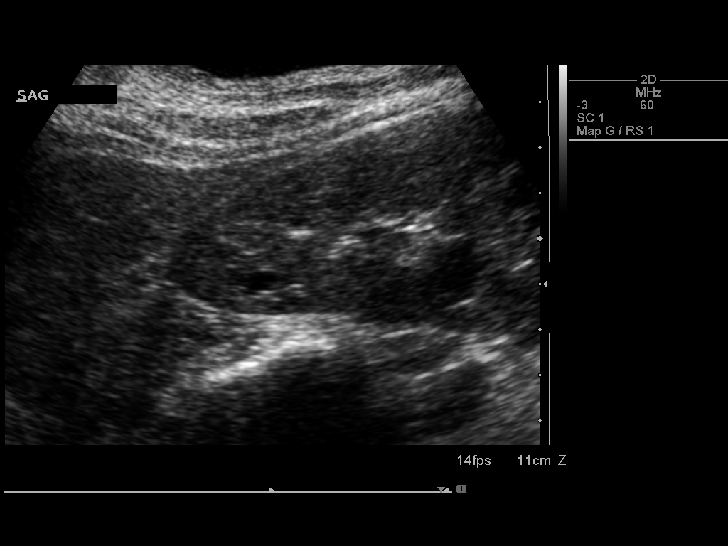
[im 17/50]
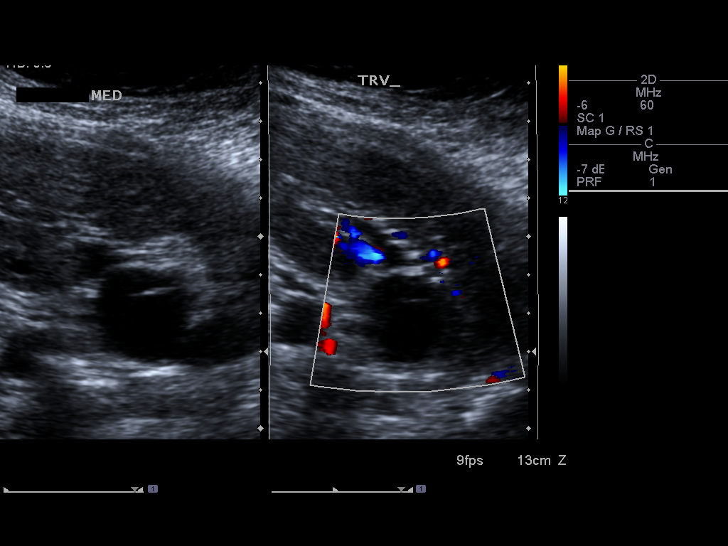
[im 19/50]
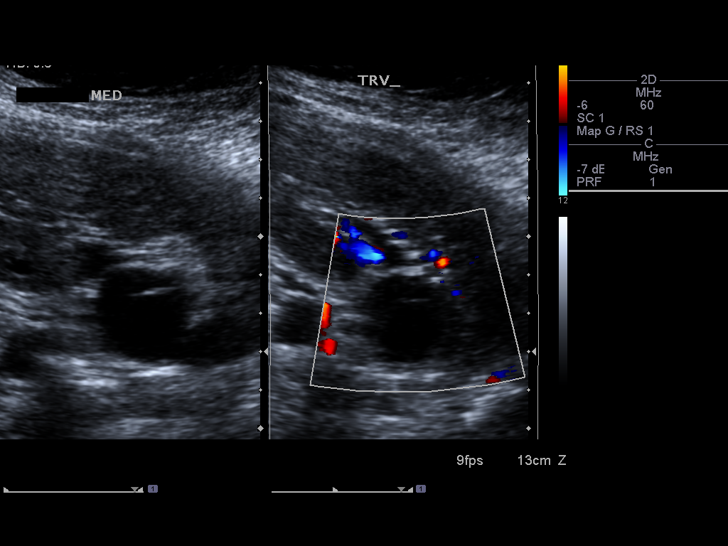
[im 23/50]
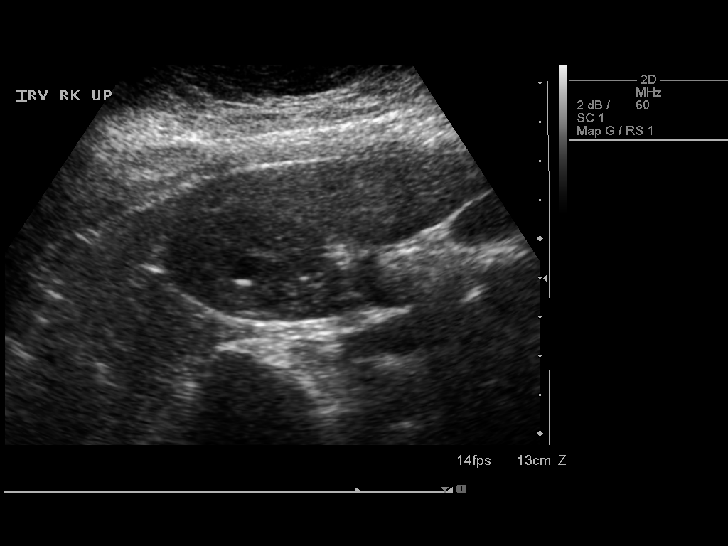
[im 27/50]
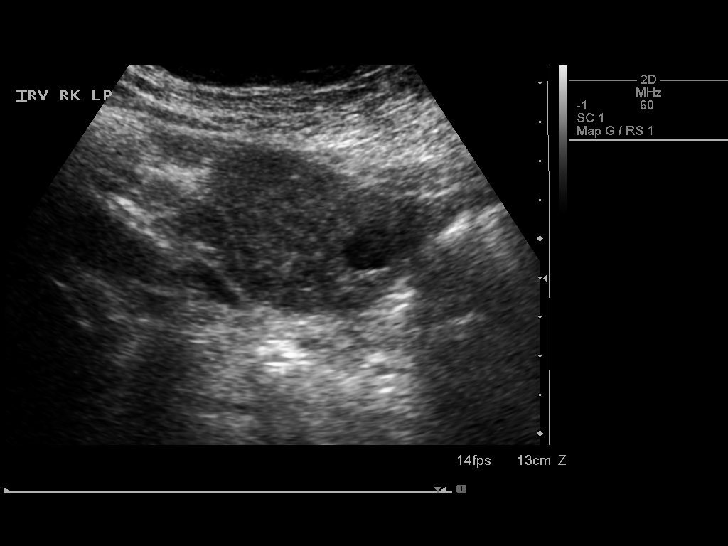
[im 31/50]
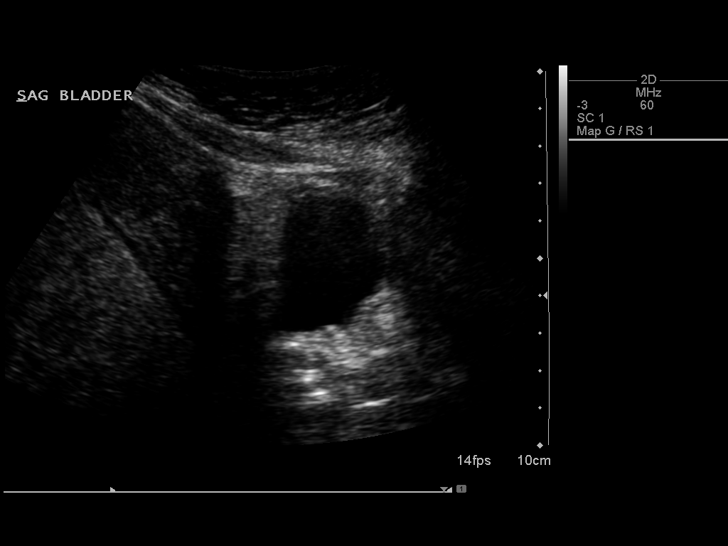
[im 33/50]
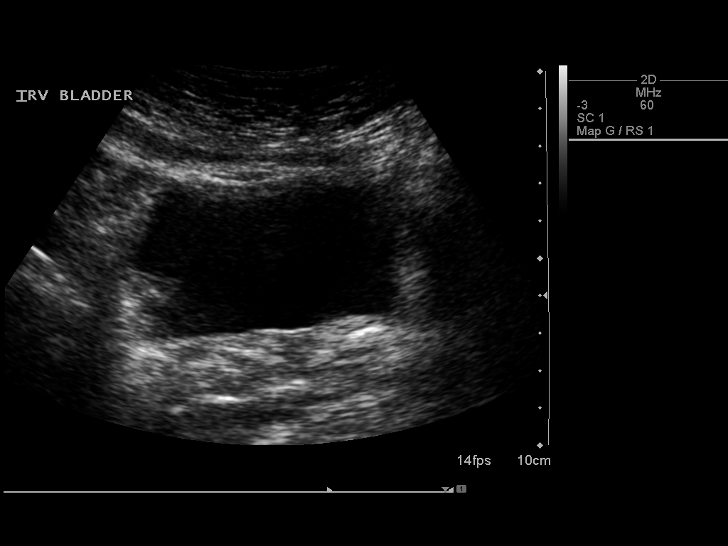
[im 37/50]
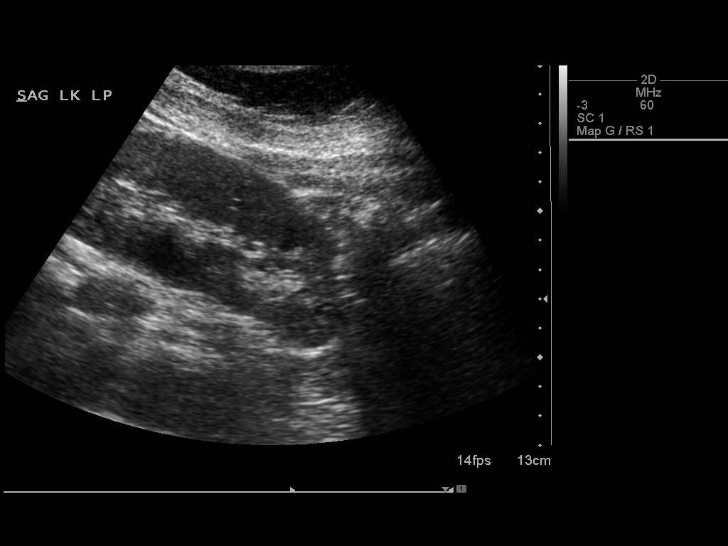
[im 41/50]
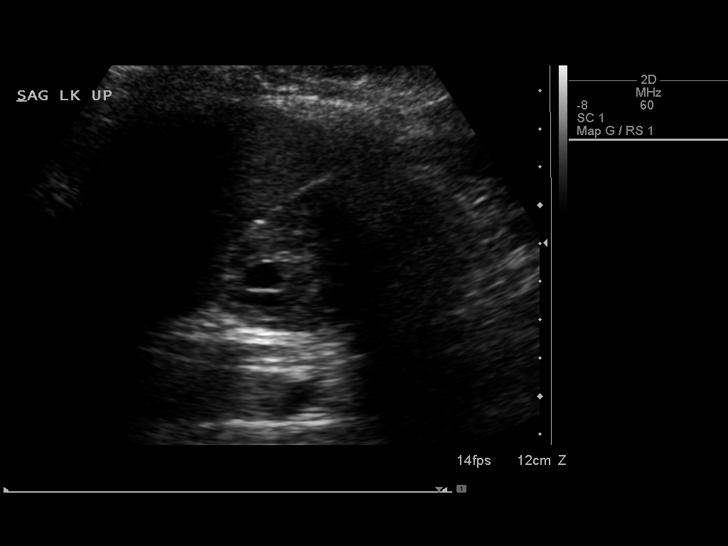
[im 45/50]
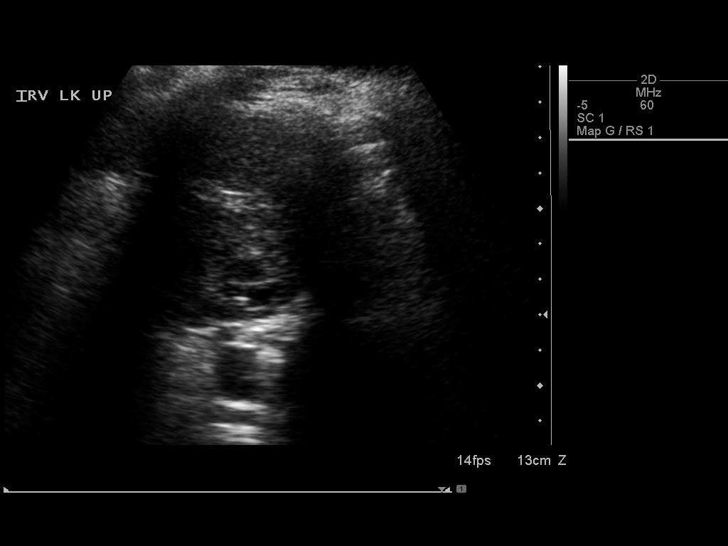
[im 50/50]
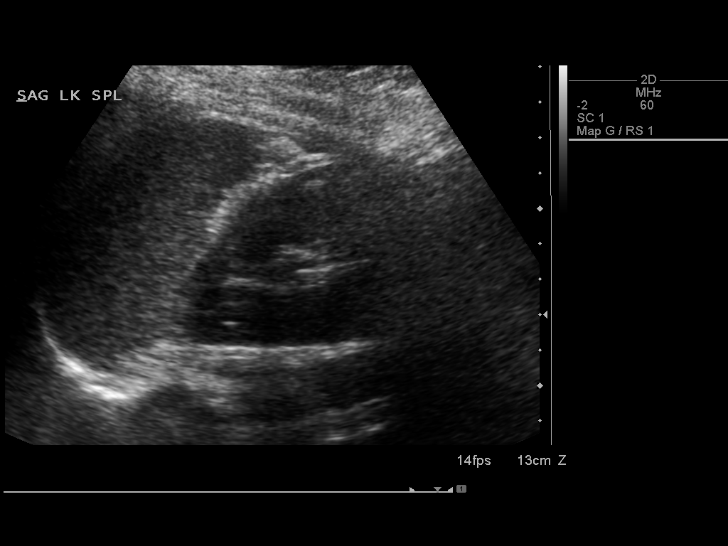

[14 of 25 positions shown; findings below may reference images not displayed]

FINDINGS: Right Kidney:

Length: 11.2 cm. Echogenicity within normal limits. Several simple
and complex cysts are noted within anechoic simple appearing cyst
measuring 1.8 x 1.2 x 1.8 cm in the lower pole, thinly septated
complex medial 2.5 x 2.4 x 2.6 cm cyst and a partially calcified non
suspicious 0.8 x 0.7 x 0.7 cm in the upper pole. No nephrolithiasis.
No solid-appearing masses.

Left Kidney:

Length: 11.7 cm. Echogenicity within normal limits. No
solid-appearing mass or obstructive uropathy. No nephrolithiasis.
Upper pole simple 0.9 x 1.3 x 0.8 cm.

Bladder:

Appears normal for degree of bladder distention.
IMPRESSION: Bilateral simple appearing cysts with a few benign-appearing complex
thinly septated or calcified cysts on the right.

No significant loss cortical-medullary distinction nor renal
scarring.

No obstructive uropathy or nephrolithiasis.

## 2019-08-01 ENCOUNTER — Ambulatory Visit: Admit: 2019-08-01 | Payer: PRIVATE HEALTH INSURANCE | Attending: Nephrology | Primary: Internal Medicine

## 2019-08-28 ENCOUNTER — Encounter: Admit: 2019-08-28 | Payer: PRIVATE HEALTH INSURANCE | Attending: Rheumatology | Primary: Internal Medicine

## 2019-08-28 DIAGNOSIS — M359 Systemic involvement of connective tissue, unspecified: Secondary | ICD-10-CM

## 2019-08-28 DIAGNOSIS — R768 Other specified abnormal immunological findings in serum: Secondary | ICD-10-CM

## 2019-08-28 MED ORDER — HYDROXYCHLOROQUINE 200 MG TABLET
200 mg | ORAL_TABLET | Freq: Every day | ORAL | 1 refills | Status: AC
Start: 2019-08-28 — End: 2020-02-25

## 2019-08-28 NOTE — Patient Instructions
Continue Plaquenil 200 mg daily.Ophthalmology followup. Repeat labs before your next visit.Followup in 3-4 months.

## 2019-08-29 NOTE — Progress Notes
VIDEO TELEHEALTH VISIT: This clinician is part of the telehealth program and is conducting this visit in a currently approved location. For this visit the clinician and patient were present via interactive audio & video telecommunications system that permits real-time communications.Patient/parent or guardian consent given for video visit: Yes State patient is located in: [place] clinician is appropriately licensed in the above state to provide care for this visit. Other individuals present during the telehealth encounter and their role/relation: noneIf billing based on time, please complete (Not required if billing based on MDM):                           Total time spent in medical video consultation: 20; Total time spent by the provider on the day of service, which includes time spent on chart review, medical video consultation, education, coordination of care/services and counseling Because this visit was completed over video, a hands-on physical exam was not performed.  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 .Cathy Evans is a 54 y.o. female with PMHx notable for HTN, polycystic kidney disease, who presents for initial visit. She was referred by her gastroenterologist Dr. Vesta Mixer for concern of positive ANA, found as part of evaluation for dysphagia. She has underwent EGD and colonoscopy fall 2019 but notes symptoms persist despite initiation of PPI. She reports fatigue, hair thinning, episode of oral ulcers, along with arthralgia and myalgia, pregnancy loss at 3 months and possible issue with her platelets in the past. 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 Pt encouraged to use MyChart to let us know if she has any questions or concerns.Interval Change:--Seen 12/2018, we discussed how her presentation may be suggestive of an evolving UCTD for which we discussed starting HCQ (AEs discussed) to which she agreed. She will start HCQ 200 mg daily and schedule Ophthalmology visit with [facility]. She will return for followup in 3 months or earlier PRN.--Seen 05/22/2019, on HCQ 200 mg daily that was started after her last visit, noting overall improvement, particularly in her MSK symptoms. She still endorses sicca, hair thinning, and fatigue; she describes some mild hand symptoms that are suggestive of inflammatory arthritis. We discussed continuing to monitor on HCQ 200 mg daily--she is up to date on Ophthalmology evaluation and has VF testing next week. We will see her for followup in 3-4 months.  Today:She was unable to come in for person today due to taking care of her grandchildren who are undergoing remote learning. She is on HCQ 200 mg daily. She reports having aches in her body with pain in her knees, lateral hips, and back. No numbness between her legs. No rashes, oral ulcers, dry eyes, abdominal pain, or Raynaud's. She is still having hair thinning. She notes dry mouth is not as bad; occasional dysphagia. She has never had a swallowing study. No coughing when eating. She has not had GI followup since early 2020.She has not had followup Ophthalmology visit.She is taking supplement sea moss.Seen by Nephrology  06/08/2019, followed for polycystic kidney disease.She has not received COVID vaccine.Review of Systems (negative except as noted; positive findings bold)Constitutional: weight changes, fatigue, malaise, fever, chills, sweatsSkin: rashes, photosensitivity, hives, easy bruisability, alopecia, scalp tenderness, skin nodules or psoriasis Eyes:  pain, redness, itching, visual blurring, dryness, foreign body sensationENT: tinnitus, hearing loss, sinus congestion, loss of smell, dry nose, bloody nose, jaw claudication, oral ulcers, loss of taste, dry mouth, hoarsenessCardiac: chest pain, palpitations, irregular heartbeat, exertional dyspneaVascular: Raynaud?s, chilblains, frostbite, venous stasis, thrombosisPulmonary: shortness of breath, cough, wheeze, hemoptysis, chest wall pain, pleuritic chest pain, current tobacco use GI: difficulty swallowing, nausea, vomiting, GERD, ulcers, constipation, diarrhea, change in bowel habits, abdominal pain, liver diseaseGU: genital ulcers, dysuria, blood in urine, nocturia, infections, kidney stonesMusculoskeletal: morning stiffness, neck pain, back pain, joint pain, joint swelling, muscle aching or tenderness, muscle weaknessNeurological: numbness, tingling, headaches, fainting, dizziness, imbalance, memory loss, seizure, strokeHeme/Lymph: swollen or tender glands, anemiaPsychiatric:  anxiety, irritability, depression, sleep disturbancePast Medical History:Past Medical History: Diagnosis Date ? Chronic kidney disease   PKD, but normal creatinine ? Hypertension  ? Mitral valve prolapse  ? Palpitations  ? Polycystic kidney disease  ? Vitamin D deficiency  Past Surgical History: Procedure Laterality Date ? APPENDECTOMY  1984 ? DILATION AND CURETTAGE OF UTERUS   ? HYSTERECTOMY  08/30/2017 ? LAPAROSCOPY   ? LEEP   ? TONSILLECTOMY   ? TUBAL LIGATION   Allergy:Allergies Allergen Reactions ? Penicillins Hives   Hives Has patient had a PCN reaction causing immediate rash, facial/tongue/throat swelling, SOB or lightheadedness with hypotension: YESHas patient had a PCN reaction causing severe rash involving mucus membranes or skin necrosis: NOHas patient had a PCN reaction that required hospitalization NOHas patient had a PCN reaction occurring within the last 10 years: NOIf all of the above answers are NO, then may proceed with Cephalosporin use. ? Sulfa (Sulfonamide Antibiotics) Hives and Rash ? 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 ascorbic acid (VITAMIN C ORAL) Take 500 mg by mouth.  ? ergocalciferol, vitamin D2, (VITAMIN D ORAL) Take 1,000 Units by mouth.  ? hydrOXYchloroQUINE Take 1 tablet (200 mg total) by mouth daily. ? Lactobacillus acidophilus (PROBIOTIC ORAL) Take by mouth. ? lisinopriL Take 1 tablet (5 mg total) by mouth daily. ? multivitamin Take 1 capsule by mouth daily.   No current facility-administered medications for this visit.  Lab and Imaging ResultsLab Results Component Value Date  WBC 5.7 05/01/2019  HGB 13.3 05/01/2019  HCT 40.5 05/01/2019  MCV 89.8 05/01/2019  PLT 294 05/01/2019 Lab Results Component Value Date  CREATININE 0.55 05/01/2019  BUN 8 05/01/2019  NA 136 05/01/2019  K 4.5 05/01/2019  CL 101 05/01/2019  CO2 23 05/01/2019 Lab Results Component Value Date  ALT 21 05/01/2019  AST 28 05/01/2019  ALKPHOS 73 05/01/2019  BILITOT 0.3 05/01/2019  12/2019ANA	1:1280 nuclear, multiple nuclear dotsdsDNA	Negative Scl-70 	Negative SSA	NegativeSSB	NegativeJo-1 	NegativeCCP 	<16RF IgM 	10ANCA	NegativeSm Ab	NegativeMito Ab	Negative C3	161C4	4212/19/2019 Bland abdomen pelvisThe liver, gallbladder, spleen, pancreas, and adrenal glands are unremarkable, except for tiny hepatic cysts. There are multiple bilateral renal cysts and hypodensities too small to characterize, including a 2.0 cm lesion in the right kidney that likely represents a cyst with new hemorrhagic/proteinaceous debris (image 35, series 2).?There is no bowel obstruction, ascites, or lymphadenopathy.?No aggressive osseous lesions are seen.IMPRESSION: No evidence of metastatic disease.SummaryMerashe Evans is a 54 y.o.  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 She is on HCQ 200 mg daily, noting some MSK symptoms that are seem more mechanical in etiology. She also continues to have hair thinning, dry mouth, and some dysphagia. Her last GI visit was in early 2020 after endoscopies in 2019; I encouraged her to followup with GI prior to considering barium swallow study. At this time, we discussed scheduling an in-person visit and will plan to repeat labs before her followup in 3-4 months. 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 RecommendationsLabs to monitor disease activity and medications: Orders Placed This Encounter Procedures ? C-reactive protein (CRP) ? Sedimentation rate (ESR) ? C3 complement ? C4 complement     (BH GH L LMW YH) ? CBC and differential ? Comprehensive metabolic panel Meds: Continue HCQ 200 mg dailyReferrals: Followup with Ophthalmology, GIPatient education/counseling RE: HCQ, COVID vaccineFollow up: The patient knows to call with any concerns before the next appointment. Next visit: 3-4 Lorelee Cover, MDYale Rheumatology Cathy [email or web address]

## 2019-08-30 ENCOUNTER — Encounter: Admit: 2019-08-30 | Payer: PRIVATE HEALTH INSURANCE | Attending: Cardiovascular Disease | Primary: Internal Medicine

## 2019-08-30 DIAGNOSIS — Z1231 Encounter for screening mammogram for malignant neoplasm of breast: Secondary | ICD-10-CM

## 2019-08-31 ENCOUNTER — Inpatient Hospital Stay: Admit: 2019-08-31 | Discharge: 2019-08-31 | Payer: PRIVATE HEALTH INSURANCE | Primary: Internal Medicine

## 2019-08-31 DIAGNOSIS — Z1231 Encounter for screening mammogram for malignant neoplasm of breast: Secondary | ICD-10-CM

## 2019-09-04 ENCOUNTER — Telehealth: Admit: 2019-09-04 | Payer: PRIVATE HEALTH INSURANCE | Attending: Nephrology | Primary: Internal Medicine

## 2019-09-04 NOTE — Telephone Encounter
Received a call from pt She states her 53 year old son has (Cathy Evans DOB 07/09/92) has hematuria and abnormal blood work- she could not confirm what labs exactlyShe is asking if Dr. Celedonio Savage would see him as a ptI asked her to have PCP send referral, labs and any imaging to out office for reviewWill rt to Dr. Celedonio Savage to inform

## 2019-09-05 NOTE — Telephone Encounter
I called this patient and spoke to her about scheduling her son to see Dr Celedonio Savage. Per this message.  The patient stated that her Son would not be back in town for a few months and she stated that she would call us back after she knows exactly when he will be back.Cathy Evans

## 2019-09-05 NOTE — Telephone Encounter
Will ask for assistance from referral specialist to reach out to pt mother to create Epic record, request records, and schedule appt with Dr. Celedonio Savage per her request

## 2019-09-11 ENCOUNTER — Telehealth: Admit: 2019-09-11 | Payer: PRIVATE HEALTH INSURANCE | Attending: Nephrology | Primary: Internal Medicine

## 2019-09-11 NOTE — Telephone Encounter
Spoke with the pt via telephone.  She called stating that she would like an appointment with Dr. Celedonio Savage.  She said that she's had ashrp pain just below my left shoulder blade for the past month.  She said that it radiates around to the left rib area and is tender to touch, I can't even sleep on that side.  Pt had a zoom appointment with her PCP yesterday and he thinks it's a pulled or strained muscle.  He gave her Methocarbamol for the discomfort but she hasn't picked up the script yet.  Pt denies an B/B changes, strenuous activity or trauma to the area.  I advised her that I would forward the information to Dr. Celedonio Savage to advise.  Pt can be reached at [phone]

## 2019-09-11 NOTE — Telephone Encounter
Called and spoke with the pt.  I advised her that Dr. Celedonio Savage wanted her to be seen by her APRN, Elyssa Noce.  She accepted an appointment for this Friday.

## 2019-09-14 ENCOUNTER — Encounter: Admit: 2019-09-14 | Payer: PRIVATE HEALTH INSURANCE | Attending: Nephrology | Primary: Internal Medicine

## 2019-09-14 ENCOUNTER — Inpatient Hospital Stay: Admit: 2019-09-14 | Discharge: 2019-09-14 | Payer: PRIVATE HEALTH INSURANCE | Primary: Internal Medicine

## 2019-09-14 ENCOUNTER — Ambulatory Visit: Admit: 2019-09-14 | Payer: PRIVATE HEALTH INSURANCE | Attending: Nephrology | Primary: Internal Medicine

## 2019-09-14 DIAGNOSIS — N189 Chronic kidney disease, unspecified: Secondary | ICD-10-CM

## 2019-09-14 DIAGNOSIS — R109 Unspecified abdominal pain: Secondary | ICD-10-CM

## 2019-09-14 DIAGNOSIS — R002 Palpitations: Secondary | ICD-10-CM

## 2019-09-14 DIAGNOSIS — I341 Nonrheumatic mitral (valve) prolapse: Secondary | ICD-10-CM

## 2019-09-14 DIAGNOSIS — Q612 Polycystic kidney, adult type: Secondary | ICD-10-CM

## 2019-09-14 DIAGNOSIS — I10 Essential (primary) hypertension: Secondary | ICD-10-CM

## 2019-09-14 DIAGNOSIS — Q613 Polycystic kidney, unspecified: Secondary | ICD-10-CM

## 2019-09-14 DIAGNOSIS — E559 Vitamin D deficiency, unspecified: Secondary | ICD-10-CM

## 2019-09-14 LAB — URINALYSIS-MACROSCOPIC W/REFLEX MICROSCOPIC
BKR BILIRUBIN, UA: NEGATIVE
BKR BLOOD, UA: NEGATIVE
BKR GLUCOSE, UA: NEGATIVE
BKR KETONES, UA: NEGATIVE
BKR LEUKOCYTE ESTERASE, UA: NEGATIVE
BKR NITRITE, UA: NEGATIVE
BKR PH, UA: 6 (ref 5.5–7.5)
BKR PROTEIN, UA: NEGATIVE
BKR SPECIFIC GRAVITY, UA: 1.015 (ref 1.005–1.030)
BKR UROBILINOGEN, UA: <2 EU/dL (ref <=2.0)

## 2019-09-14 LAB — CBC WITH AUTO DIFFERENTIAL
BKR WAM ABSOLUTE IMMATURE GRANULOCYTES: 0 x 1000/ÂµL (ref 0.0–0.3)
BKR WAM ABSOLUTE LYMPHOCYTE COUNT: 2.1 x 1000/ÂµL (ref 1.0–4.0)
BKR WAM ABSOLUTE NRBC: 0 x 1000/ÂµL (ref 0.0–0.0)
BKR WAM ANALYZER ANC: 2.8 x 1000/ÂµL (ref 1.0–11.0)
BKR WAM BASOPHIL ABSOLUTE COUNT: 0.1 x 1000/ÂµL — ABNORMAL HIGH (ref 0.0–0.0)
BKR WAM BASOPHILS: 0.9 % (ref 0.0–4.0)
BKR WAM EOSINOPHIL ABSOLUTE COUNT: 0.4 x 1000/ÂµL (ref 0.0–1.0)
BKR WAM EOSINOPHILS: 6.4 % (ref 0.0–7.0)
BKR WAM HEMATOCRIT: 37.6 % (ref 37.0–52.0)
BKR WAM HEMOGLOBIN: 12.8 g/dL (ref 12.0–18.0)
BKR WAM IMMATURE GRANULOCYTES: 0.2 % (ref 0.0–3.0)
BKR WAM LYMPHOCYTES: 36.3 % (ref 8.0–49.0)
BKR WAM MCH (PG): 29.8 pg (ref 27.0–31.0)
BKR WAM MCHC: 34 g/dL (ref 31.0–36.0)
BKR WAM MCV: 87.6 fL (ref 78.0–94.0)
BKR WAM MONOCYTE ABSOLUTE COUNT: 0.5 x 1000/ÂµL (ref 0.0–2.0)
BKR WAM MONOCYTES: 8.8 % (ref 4.0–15.0)
BKR WAM MPV: 9.1 fL (ref 6.0–11.0)
BKR WAM NEUTROPHILS: 47.4 % (ref 37.0–84.0)
BKR WAM NUCLEATED RED BLOOD CELLS: 0 % (ref 0.0–1.0)
BKR WAM PLATELETS: 311 x1000/ÂµL (ref 140–440)
BKR WAM RDW-CV: 12.7 % (ref 11.5–14.5)
BKR WAM RED BLOOD CELL COUNT: 4.3 M/ÂµL (ref 3.8–5.9)
BKR WAM WHITE BLOOD CELL COUNT: 5.8 x1000/ÂµL (ref 4.0–10.0)

## 2019-09-14 LAB — PHOSPHORUS     (BH GH L LMW YH): BKR PHOSPHORUS: 4 mg/dL (ref 2.2–4.5)

## 2019-09-14 LAB — BASIC METABOLIC PANEL
BKR ANION GAP: 10 (ref 7–17)
BKR BLOOD UREA NITROGEN: 9 mg/dL (ref 6–20)
BKR BUN / CREAT RATIO: 15.5 (ref 8.0–23.0)
BKR CALCIUM: 9.5 mg/dL (ref 8.8–10.2)
BKR CHLORIDE: 104 mmol/L (ref 98–107)
BKR CO2: 25 mmol/L (ref 20–30)
BKR CREATININE: 0.58 mg/dL (ref 0.40–1.30)
BKR EGFR (AFR AMER): >60 mL/min/{1.73_m2} (ref >60)
BKR EGFR (NON AFRICAN AMERICAN): >60 mL/min/{1.73_m2} (ref >60)
BKR GLUCOSE: 93 mg/dL (ref 70–100)
BKR POTASSIUM: 3.9 mmol/L (ref 3.3–5.1)
BKR SODIUM: 139 mmol/L (ref 136–144)

## 2019-09-14 MED ORDER — LIDOCAINE 5 % ADHESIVE PATCH
5 % | MEDICATED_PATCH | TRANSDERMAL | 12 refills | Status: AC
Start: 2019-09-14 — End: 2020-07-03

## 2019-09-14 NOTE — Progress Notes
[facility] Follow-up VisitHPI: Cathy Evans is a 54 yo F with a history of ADPKD presenting today for urgent visit for left flank pain. She has been having intermittent sharp pain in her left back that radiates to her left flank and is tender to touch. She denies trauma or injury to the area. She notes the pain feels internal rather than musculoskeletal. She states that if she sits too long, she has a hard time getting up but once she is moving, the discomfort improves. She has difficulty lying on her left side and sleeping. She has tried muscle relaxers from her PCP without improvement. She denies hematuria. She has remote history of kidney stones but states the discomfort does not feel like colic. Medications: Allergies: reviewedMeds:Current Outpatient Medications on File Prior to Visit Medication Sig Dispense Refill ? ascorbic acid (VITAMIN C ORAL) Take 500 mg by mouth.    ? ergocalciferol, vitamin D2, (VITAMIN D ORAL) Take 1,000 Units by mouth.    ? hydrOXYchloroQUINE (PLAQUENIL) 200 mg tablet Take 1 tablet (200 mg total) by mouth daily. 90 tablet 0 ? Lactobacillus acidophilus (PROBIOTIC ORAL) Take by mouth.   ? lisinopriL (PRINIVIL,ZESTRIL) 5 mg tablet Take 1 tablet (5 mg total) by mouth daily. 90 tablet 2 ? multivitamin capsule Take 1 capsule by mouth daily.     No current facility-administered medications on file prior to visit.  Review of Systems: A 14-point review of systems was performed. Pertinent nephrology findings are recorded below or in the HPI  above. The remainder of the systems were reviewed and not-contributory to this patient's illness.Nephrology Pertinent Review of Systems ?	No fevers/chills?	No headaches or tremors?	No eye pain or blurry vision?	No cough or sore throat?	No SOB or DOE?	No chest pain or palpitations?	No N/V/C/D/abd pain?	No recent weight changes?	Appetite remains good. No dysgeusia.?	No difficulty urinating. There is a good urine stream without hesitancy. There is no urgency, no frequency, and no dysuria.  ?	No hematuria or foamy/bubbly urine?	No LE edema?	No rash?	No joint aches?	No sleep disturbances?	Mood is goodPhysical Exam and Data Review: Vitals: BP 124/84 (Site: l a, Position: Sitting, Cuff Size: Medium)  - Pulse 82  - Temp 98.2 ?F (36.8 ?C) (Temporal)  - Ht 5' 5 (1.651 m)  - Wt 77.7 kg  - LMP 08/30/2017 (Exact Date)  - BMI 28.52 kg/m? Physical Exam:?	Gen: A & O x 4, NAD?	HEENT: NCAT, nonicteric sclera?	Neck: no LAD noted, no JVD?	CV: RRR, no m/r/g noted?	Pulm: LS CTA bilat?	Abd: soft, mildly distended, BS normoactive, mild tenderness to left mid back, no flank tenderness, no spinous or CVA tenderness?	Ext: WWP, no edema noted?	Skin: grossly intact?	Neuro: no asterixis notedLabs:Lab Results Component Value Date  WBC 5.8 09/14/2019  HGB 12.8 09/14/2019  HCT 37.6 09/14/2019  PLT 311 09/14/2019  GLU 93 09/14/2019  BUN 9 09/14/2019  CREATININE 0.58 09/14/2019  CO2 25 09/14/2019  CL 104 09/14/2019  NA 139 09/14/2019  K 3.9 09/14/2019  CALCIUM 9.5 09/14/2019  EGFR >60 08/23/2012  PRCRRAU 0.06 12/11/2018  PHOS 4.0 09/14/2019  HGBA1C 5.5 05/01/2019  IRON 109 05/01/2019  TIBC 335 05/01/2019  FERRITIN 98 05/01/2019 Assessment & Plan Cathy Evans is a 55 yo F with a history of ADPKD presenting today for urgent visit for left flank pain.Her physical exam is unremarkable today with the exception of mild tenderness at her left mid back over her left kidney. Her UA is negative and bloodwork is stable. No evidence of bleeding. We obtained a stat renal US today which is notable for stable cysts from last exam in 01/2019 with the exception  of increased debris in the largest cyst at the left upper pole, which measures 1.2 x 1.3 x 1.4 cm. She may have had some bleeding into this cyst, but overall it is stable. This cysts is located in the area that is bothering her, so it may be the source of discomfort. Given its size, it is likely too small to aspirate any fluid. No stones were noted.I have offered her lidocaine patches and low dose tramadol today for management of this discomfort and hopefully improve her sleep hygiene. She should call the office if symptoms persist or worsen. Otherwise, we will see her in July as scheduled.RTC July as scheduledLabs needed for next visit: BMP, phos, CBCSigned:Lanette Ell Walker [facility] NephrologyOffice: [phone]: [phone] Signed by Anette Riedel, APRN, Sep 14, 2019

## 2019-09-17 ENCOUNTER — Telehealth: Admit: 2019-09-17 | Payer: PRIVATE HEALTH INSURANCE | Attending: Nephrology | Primary: Internal Medicine

## 2019-09-17 NOTE — Telephone Encounter
PA denied for lidocaine Patch:Denied. We are unable to approve your request for this drug. This drug is approved to treat pain due to diabetic neuropathy (nerve damage due to diabetes), pain due to cancer, or nerve pain caused by a rash due to Shingles (a viral infection). We do not show that you are using it for any of these reasons; therefore, this request is denied. For prescribers/practitioner: Approved indications are diabetic neuropathy, pain due to cancer, or post-herpetic neuralgia.

## 2019-09-17 NOTE — Telephone Encounter
PA request submitted today for Lidocaine patches through Cover my Meds.

## 2019-09-18 ENCOUNTER — Encounter: Admit: 2019-09-18 | Payer: PRIVATE HEALTH INSURANCE | Attending: Nephrology | Primary: Internal Medicine

## 2019-09-18 MED ORDER — TRAMADOL 50 MG TABLET
50 mg | ORAL_TABLET | Freq: Four times a day (QID) | ORAL | 1 refills | Status: AC | PRN
Start: 2019-09-18 — End: ?

## 2019-09-18 NOTE — Telephone Encounter
Patient calling in regards to denial of lidocaine patches.  She states she spoke to pharmacy, and they advised her that provider's office would have to reach out to insurance company to have medication approved.    Patient states pharmacy never received prescription for Tramadol.  Requesting it be sent to [facility] pharmacy, [facility] on file.

## 2019-09-24 ENCOUNTER — Telehealth: Admit: 2019-09-24 | Payer: PRIVATE HEALTH INSURANCE | Attending: Nephrology | Primary: Internal Medicine

## 2019-09-24 DIAGNOSIS — R109 Unspecified abdominal pain: Secondary | ICD-10-CM

## 2019-09-24 NOTE — Telephone Encounter
Received a call from pt she c/o worsening bilateral flank pain - started on  Left now on right sidePain with even getting dressed and putting pants on Pt reports new urinary pressure at end if urination- no fever, chills or obvious hematuriaShe states it feels like throbbing, like something squeezing  releasing and squeezingShe is willing to repeat urine culturesStates Tramadol not helping She is asking if CMN for lido patches done - she is not sure if this will helpAdvised pt if pain becomes unbearable she should go to EDPt verbalized understanding Will rt to APRN and PROVIDER W high priority for recommendationsBest call back # [phone] orders pended and sent to [facility] for signing

## 2019-09-25 ENCOUNTER — Encounter: Admit: 2019-09-25 | Payer: PRIVATE HEALTH INSURANCE | Attending: Internal Medicine | Primary: Internal Medicine

## 2019-09-25 ENCOUNTER — Encounter: Admit: 2019-09-25 | Payer: PRIVATE HEALTH INSURANCE | Primary: Internal Medicine

## 2019-09-25 DIAGNOSIS — M545 Low back pain, unspecified back pain laterality, unspecified chronicity, unspecified whether sciatica present: Secondary | ICD-10-CM

## 2019-09-28 ENCOUNTER — Telehealth: Admit: 2019-09-28 | Payer: PRIVATE HEALTH INSURANCE | Attending: Nephrology | Primary: Internal Medicine

## 2019-09-28 NOTE — Telephone Encounter
Spoke with Cathy Evans who had imaging done with PCP and was told to make an appt with nephrology ASAP. No imaging noted. She will call PCP back to get clarification. Currently there is a new neurosurgery referral but nothing for nephrology. She will call back Monday 5/24/22021 after speaking with PCP.

## 2019-10-19 ENCOUNTER — Encounter: Admit: 2019-10-19 | Payer: PRIVATE HEALTH INSURANCE | Attending: Midwifery | Primary: Internal Medicine

## 2019-10-19 ENCOUNTER — Ambulatory Visit: Admit: 2019-10-19 | Payer: PRIVATE HEALTH INSURANCE | Attending: Midwifery | Primary: Internal Medicine

## 2019-10-19 ENCOUNTER — Ambulatory Visit: Admit: 2019-10-19 | Payer: PRIVATE HEALTH INSURANCE | Attending: Neurological Surgery | Primary: Internal Medicine

## 2019-10-19 DIAGNOSIS — N189 Chronic kidney disease, unspecified: Secondary | ICD-10-CM

## 2019-10-19 DIAGNOSIS — I10 Essential (primary) hypertension: Secondary | ICD-10-CM

## 2019-10-19 DIAGNOSIS — Q613 Polycystic kidney, unspecified: Secondary | ICD-10-CM

## 2019-10-19 DIAGNOSIS — E559 Vitamin D deficiency, unspecified: Secondary | ICD-10-CM

## 2019-10-19 DIAGNOSIS — R002 Palpitations: Secondary | ICD-10-CM

## 2019-10-19 DIAGNOSIS — I341 Nonrheumatic mitral (valve) prolapse: Secondary | ICD-10-CM

## 2019-10-19 DIAGNOSIS — N898 Other specified noninflammatory disorders of vagina: Secondary | ICD-10-CM

## 2019-10-21 NOTE — Progress Notes
HPI:The patient is a 54 y.o. [phone] female who presents to this practice for an annual exam.Patient's last menstrual period was 08/30/2017 (exact date). Hysterectomy 2019. Mammo neg 4/21. Has GERD but has seen GI doc. Will fu with them.Malodorous discharge. Noticed today. Feels irritated, but used body wash for odor. Washing frequently. Denies dysuria, recently treated for UTI. Denies itching. Denies abdominal pain.Did not take BP medicine today, BP slightly elevated. Sees nephrologist, who manages hypertension.Patient History?	Problem List: has Polycystic kidney disease; Kidney stone; Palpitations; Chronic bilateral low back pain without sciatica; Essential hypertension; Lumbar degenerative disc disease; Lumbar spondylosis; MVP (mitral valve prolapse); Chronic kidney disease; Polycystic kidney disease; Vitamin D deficiency; and Anxiety on their problem list. ?	Contraceptive/Menstrual/Sexual HistoryContraceptive History Hysterectomy                  Menstrual Cycle  Patient's last menstrual period was 08/30/2017 (exact date).                                 Sexual History female                         OB History Gravida Para Term Preterm AB Living 6 3 0   3 3 SAB TAB Ectopic Molar Multiple Live Births 1 2       3   # Outcome Date GA Lbr Len/2nd Weight Sex Delivery Anes PTL Lv 6 TAB          5 TAB          4 SAB          3 Para      Vag-Spont   LIV 2 Para      Vag-Spont   LIV 1 Para      Vag-Spont   LIV ?	Past Medical History: has a past medical history of Chronic kidney disease, Hypertension, Mitral valve prolapse, Palpitations, Polycystic kidney disease, and Vitamin D deficiency.?	Past Surgical History: has a past surgical history that includes Tubal ligation; LEEP; Dilation and curettage of uterus; laparoscopy; Tonsillectomy; Hysterectomy (08/30/2017); and Appendectomy (1984).?	Allergies:is allergic to penicillins; sulfa (sulfonamide antibiotics); and sulfasalazine. Medications: Current Outpatient Medications: ?  ascorbic acid (VITAMIN C ORAL), Take 500 mg by mouth. ?  ergocalciferol, vitamin D2, (VITAMIN D ORAL), Take 1,000 Units by mouth. ?  hydrOXYchloroQUINE, 200 mg, Oral, Daily?  Lactobacillus acidophilus (PROBIOTIC ORAL), Take by mouth.?  lidocaine, 1 patch, Transdermal, Q24H?  lisinopriL, 5 mg, Oral, Daily?	?  multivitamin, 1 capsule, Oral, Daily ?	Social History: reports that she is a non-smoker but has been exposed to tobacco smoke. She has never used smokeless tobacco. She reports current alcohol use. She reports that she does not use drugs. Health Maintenance: Health Maintenance Topic Date Due ? Covid-19 vaccine series (1) Never done ? HIV screening  Never done ? Cervical cancer screening (Pap Smear)  11/20/2019 ? Shingles vaccine (Shingrix) (1 of 2 - Shingrix (RZV) 2 Dose Standard Series) 03/14/2051 (Originally 01/24/2017) ? Influenza vaccine  03/14/2051 (Originally 12/09/2019) ? Tetanus adult (Td q 10,TDAP once)  03/14/2051 (Originally 01/25/1987) ? Breast cancer screening  08/30/2021 ? Diabetes screening  09/14/2022 ? Lipid disorder screening  03/14/2023 ? Colon cancer screening,Colonoscopy  04/19/2028 ? Hepatitis C screening  Completed Review of Systems:Review of Systems All other systems reviewed and are negative. Objective: ?	BP (!) 142/80  - Temp 97.9 ?F (36.6 ?C) (Temporal)  - Ht 5' 5 (1.651 m)  - Wt 77.6  kg  - LMP 08/30/2017 (Exact Date)  - BMI 28.46 kg/m?  Physical ExamGeneral:  No uterus  = wet prep done?	 Appears well, no distress	Skin:  ?	Skin color, texture, turgor normal. No rashes or lesions Lymph:?	 Inguinal Nodes NormalThyroid?	normal to inspection and palpationLungs: ?	Good breath sounds; no rales or rhonchi.Heart: ?	Regular rate and rhythm, no murmurs	Breast Exam?	Normal appearance, no masses or tendernessAbdomen: ?	soft, nontender, no masses palpated, no hepatosplenomegaly	Back:?	  No curvature. No CVA tenderness.Extremities: ?	no calf tenderness, no edema bilaterally	Neuro:?	 is alert and oriented x3. Gait normal. External Genitalia: ?	General appearance; normal	Uterus:?	Surgically absentVagina: ?	Normal appearanceCervix:?	Cervix is surgically absent			  Adnexa:?	 Normal	Rectal: ?	deferred	?	Wet Prep Done: Yes:  no pathogensAssessment / Plan: The patient is a  54 y.o. [id number]  ICD-10-CM  1. Vaginal discharge  N89.8 Affirm     (Cytology Procedures [facility])   Genetic counseling discussed - FH of breast and ovarian CA - MGM, Mat aunt, cousins - declinesMammogram:  Up to datePatient Education:?	Specific topics reviewed:Encouraged to stop using body wash and use mild unscented soap or only water.?	Follow-up: Await affirm; if neg and sx persist, RV.?	Will take antihypertensive meds as prescribed and f/u with nephrologist.

## 2019-10-23 ENCOUNTER — Telehealth: Admit: 2019-10-23 | Payer: PRIVATE HEALTH INSURANCE | Attending: Obstetrics and Gynecology | Primary: Internal Medicine

## 2019-10-23 NOTE — Telephone Encounter
Spoke with Cathy Evans to review +BV on affirm swab. She reports she had a leftover Rx for metrogel at home and started using it. Advised to complete course of treatment and call if she has any further concerns.

## 2019-12-04 ENCOUNTER — Encounter: Admit: 2019-12-04 | Payer: PRIVATE HEALTH INSURANCE | Attending: Rheumatology | Primary: Internal Medicine

## 2019-12-07 ENCOUNTER — Ambulatory Visit: Admit: 2019-12-07 | Payer: PRIVATE HEALTH INSURANCE | Attending: Nephrology | Primary: Internal Medicine

## 2019-12-07 ENCOUNTER — Encounter: Admit: 2019-12-07 | Payer: PRIVATE HEALTH INSURANCE | Attending: Nephrology | Primary: Internal Medicine

## 2019-12-07 DIAGNOSIS — N189 Chronic kidney disease, unspecified: Secondary | ICD-10-CM

## 2019-12-07 DIAGNOSIS — R002 Palpitations: Secondary | ICD-10-CM

## 2019-12-07 DIAGNOSIS — I10 Essential (primary) hypertension: Secondary | ICD-10-CM

## 2019-12-07 DIAGNOSIS — I341 Nonrheumatic mitral (valve) prolapse: Secondary | ICD-10-CM

## 2019-12-07 DIAGNOSIS — N182 Chronic kidney disease, stage 2 (mild): Secondary | ICD-10-CM

## 2019-12-07 DIAGNOSIS — E559 Vitamin D deficiency, unspecified: Secondary | ICD-10-CM

## 2019-12-07 DIAGNOSIS — Q613 Polycystic kidney, unspecified: Secondary | ICD-10-CM

## 2019-12-07 NOTE — Patient Instructions

## 2019-12-11 ENCOUNTER — Encounter: Admit: 2019-12-11 | Payer: PRIVATE HEALTH INSURANCE | Primary: Internal Medicine

## 2019-12-14 ENCOUNTER — Telehealth: Admit: 2019-12-14 | Payer: PRIVATE HEALTH INSURANCE | Attending: Nephrology | Primary: Internal Medicine

## 2019-12-14 ENCOUNTER — Encounter: Admit: 2019-12-14 | Payer: PRIVATE HEALTH INSURANCE | Attending: Nephrology | Primary: Internal Medicine

## 2019-12-14 ENCOUNTER — Telehealth: Admit: 2019-12-14 | Payer: PRIVATE HEALTH INSURANCE | Attending: "Endocrinology | Primary: Internal Medicine

## 2019-12-14 MED ORDER — TRAMADOL 50 MG TABLET
50 mg | ORAL_TABLET | Freq: Four times a day (QID) | ORAL | 1 refills | Status: AC | PRN
Start: 2019-12-14 — End: ?

## 2019-12-14 NOTE — Telephone Encounter
Spoke with the pt who called requesting a refill of Ibuprofen from Dr. Celedonio Savage.  It appears that the last refill from Dr. Celedonio Savage was written in 2016.  Pt c/o chronic lower back pain 2/2 PKD.  She said that she called her PCP for the same refill but has yet to hear from the office.  I advised her that I would forward the request to Dr. Celedonio Savage.  Pt can be reached at [phone] for any questions.

## 2019-12-14 NOTE — Telephone Encounter
Patient called and advised of the Tramadol prescription.

## 2019-12-14 NOTE — Telephone Encounter
Opened in error

## 2019-12-14 NOTE — Telephone Encounter
PT calling asking if Dr. Celedonio Savage would prescribe her Motrin because she is having lower back pain

## 2019-12-27 NOTE — Progress Notes
[facility]                               Specialty Programs:	Cardio-Renal 		Kidney Stones			Glomerular Diseases	Onco- Nephrology			Inherited Diseases	Kidney Disease in Pregnancy			Hypertension		Chronic Kidney DiseaseY[facility], 2nd Floor[address] [facility], [place] [address] [phone] was a telehealth visit conducted during the COVID-19 pandemic.Encounter Date: 07/30/21Primary Care Provider: Sherolyn Buba Primary conditions for which we are involved:Polycystic kidney disease?Update about patient's health, including new symptoms:I saw?Cathy Evans on?December 07, 2019?for further evaluation and management of polycystic kidney disease. ??The patient is a?18?year-old woman who does not have a family history of?PKD.??Her father developed end-stage renal disease and underwent a kidney transplant, but she thinks that may have been due to diabetic kidney disease. ?She has a history of both kidney and liver cysts and hypertension, but has never had significant loss of renal function. ?She previously had anemia, but this was due to iron deficiency, and has resolved completely. 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  She took today off from work because she is feeling more lightheaded and dizzy.  Her symptoms sound somewhat like or vertigo in that she feels that the room is spinning.  She is worried that it may be related to another condition.  She took her blood pressures at home and they were 120/71 with a pulse of 78 and 117/70 with a pulse of 74.  She has some spine pain, but it is not particularly bothersome.  She otherwise has been fairly stable.  She has had no recent hospitalizations.Past Medical HistoryMerashe Evans has a past medical history of Chronic kidney disease, Hypertension, Mitral valve prolapse, Palpitations, Polycystic kidney disease, and Vitamin D deficiency.Medications Current Outpatient Medications Medication Sig Dispense Refill ? ascorbic acid (VITAMIN C ORAL) Take 500 mg by mouth.    ? ergocalciferol, vitamin D2, (VITAMIN D ORAL) Take 1,000 Units by mouth.    ? hydrOXYchloroQUINE (PLAQUENIL) 200 mg tablet Take 1 tablet (200 mg total) by mouth daily. 90 tablet 0 ? lidocaine (LIDODERM) 5 % Place 1 patch onto the skin daily. Remove & Discard patch within 12 hours or as directed by MD 30 patch 11 ? lisinopriL (PRINIVIL,ZESTRIL) 5 mg tablet Take 1 tablet (5 mg total) by mouth daily. 90 tablet 2 ? multivitamin capsule Take 1 capsule by mouth daily.     ? Lactobacillus acidophilus (PROBIOTIC ORAL) Take by mouth.   No current facility-administered medications for this visit.  Relevant vitals and labsBP Readings from Last 3 Encounters: 10/19/19 (!) 142/80 09/14/19 124/84 07/04/18 130/80 Lab Results Component Value Date  WBC 5.8 09/14/2019  HGB 12.8 09/14/2019  HCT 37.6 09/14/2019  PLT 311 09/14/2019  GLU 93 09/14/2019  BUN 9 09/14/2019  CREATININE 0.58 09/14/2019  CO2 25 09/14/2019  CL 104 09/14/2019  NA 139 09/14/2019  K 3.9 09/14/2019  CALCIUM 9.5 09/14/2019  EGFR >60 08/23/2012  PRCRRAU 0.06 12/11/2018  PHOS 4.0 09/14/2019  HGBA1C 5.5 05/01/2019  IRON 109 05/01/2019  TIBC 335 05/01/2019  FERRITIN 98 05/01/2019 US RENAL - completed 09/14/19?COMPARISON: 01/11/2019.?INDICATION: Left flank pain.?FINDINGS: The right kidney measures 11.7 cm in length. The left kidney measures 12.1 cm in length. Multiple bilateral renal cysts are noted. On the right, there is a 2.5 x 2.4 x 2.7 cm simple cyst in the mid-lower pole and a 2.9 x 1.5  x 2.7 cm septated cyst laterally in the lower pole (previously measuring 2.2 x 1.3 x 1.8 cm). On the left, there is a 1.2 x 1.3 x 1.4 cm cyst with possible debris in the mid-upper pole. There is no evidence of nephrolithiasis or hydronephrosis. Mild fullness of the right renal pelvis is noted.?The urinary bladder is partially distended. Bilateral ureteral jets are visualized.?   IMPRESSION: 1. No evidence of nephrolithiasis or hydronephrosis.2. Bilateral renal cysts as above. Follow-up renal ultrasound in one year is recommended.?Reported And Signed By: Kathrin Greathouse, MD-------------------------------------------------------------------------------------------------------------------------------------------------------------------------------------------------------------Assessment and Plan:In summary, this is a 54 year old woman with a history of cystic kidneys and a family history of end-stage renal disease.  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  She has hypertension, but normal kidney function and cysts in both kidneys and in the liver.  Her blood pressure is well controlled.  Her kidney function is stable.  She had questions about appropriate dietary counseling and I have referred her to follow the DASH-type diet, which is rich in fruits and vegetables, lower in salt, rich in whole grains and lower in other sources of animal protein beyond dairy products.  She notes that her son has an elevated creatinine, so I have asked that he be referred to see [place].  I have asked to see her again in about 6 months.  Thank you for involving me in her care.-----------------------------------------------------------------------------------------------------------------------------------I have counseled about COVID19 - specifically advising on social distancing, hand washing, staying home when sick. I gave them the [facility] hotline number if they have questions: [phone]-ASK-[phone] have reminded them of our office number should they need a more urgent appointment. VIDEO TELEHEALTH VISIT: This clinician is part of the telehealth program and is conducting this visit in a currently approved location. For this visit the clinician and patient were present via interactive audio & video telecommunications system that permits real-time communications.Patient consent given for video visit: YesState patient is located in: CTThe clinician is appropriately licensed in the above state to provide care for this visit.Other individuals present during the telehealth encounter and their role/relation: noneTotal time spent in medical video consultation (required if billing based on time): 15 minTotal time spent providing education, coordination of care/services, and counseling: 20 minBecause this visit was completed over video, a hands-on physical exam was not performed.  Patient understands and knows to call back if condition changes. The visit type for this patient required modifications due to the COVID-19 outbreak. I spent a total of 15 minutes with the patient of which 20 minutes (over 50%) were spent counseling the patient regarding cystic kidneys.Scribed for Ether Griffins, MD by Ronney Lion, medical scribe July 30, 2021The documentation recorded by the scribe accurately reflects the services I personally performed and the decisions made by me. I reviewed and confirmed all material entered and/or pre-charted by the scribe. Electronically Signed by Ether Griffins, MD, December 07, 2019

## 2020-01-18 ENCOUNTER — Inpatient Hospital Stay: Admit: 2020-01-18 | Discharge: 2020-01-18 | Payer: PRIVATE HEALTH INSURANCE | Primary: Internal Medicine

## 2020-01-18 DIAGNOSIS — R768 Other specified abnormal immunological findings in serum: Secondary | ICD-10-CM

## 2020-01-18 DIAGNOSIS — M359 Systemic involvement of connective tissue, unspecified: Secondary | ICD-10-CM

## 2020-01-18 LAB — CBC WITH AUTO DIFFERENTIAL
BKR WAM ABSOLUTE IMMATURE GRANULOCYTES: 0 x 1000/??L (ref 0.0–0.3)
BKR WAM ABSOLUTE LYMPHOCYTE COUNT: 1.9 x 1000/??L (ref 1.0–4.0)
BKR WAM ABSOLUTE NRBC: 0 x 1000/ÂµL (ref 0.0–0.0)
BKR WAM ANALYZER ANC: 2.1 x 1000/ÂµL (ref 1.0–11.0)
BKR WAM BASOPHIL ABSOLUTE COUNT: 0.1 x 1000/ÂµL — ABNORMAL HIGH (ref 0.0–0.0)
BKR WAM BASOPHILS: 1.1 % — AB (ref 0.0–4.0)
BKR WAM EOSINOPHIL ABSOLUTE COUNT: 0.3 x 1000/??L (ref 0.0–1.0)
BKR WAM EOSINOPHILS: 5.3 % (ref 0.0–7.0)
BKR WAM HEMATOCRIT: 36.3 % — ABNORMAL LOW (ref 37.0–52.0)
BKR WAM HEMOGLOBIN: 12.4 g/dL (ref 12.0–18.0)
BKR WAM IMMATURE GRANULOCYTES: 0.2 % (ref 0.0–3.0)
BKR WAM LYMPHOCYTES: 40.1 % (ref 8.0–49.0)
BKR WAM MCHC: 34.2 g/dL (ref 31.0–36.0)
BKR WAM MCV: 88.3 fL (ref 78.0–94.0)
BKR WAM MONOCYTE ABSOLUTE COUNT: 0.5 x 1000/ÂµL (ref 0.0–2.0)
BKR WAM MONOCYTES: 9.7 % (ref 4.0–15.0)
BKR WAM MPV: 9.5 fL (ref 6.0–11.0)
BKR WAM NEUTROPHILS: 43.6 % — AB (ref 37.0–84.0)
BKR WAM NUCLEATED RED BLOOD CELLS: 0 % (ref 0.0–1.0)
BKR WAM PLATELETS: 273 x1000/ÂµL (ref 140–440)
BKR WAM RDW-CV: 12.9 % (ref 11.5–14.5)
BKR WAM RED BLOOD CELL COUNT: 4.1 M/??L (ref 3.8–5.9)
BKR WAM WHITE BLOOD CELL COUNT: 4.7 x1000/??L (ref 4.0–10.0)

## 2020-01-18 LAB — COMPREHENSIVE METABOLIC PANEL
BKR A/G RATIO: 1.7 (ref 1.0–2.2)
BKR ALANINE AMINOTRANSFERASE (ALT): 15 U/L (ref 10–35)
BKR ALBUMIN: 4.6 g/dL (ref 3.6–4.9)
BKR ALKALINE PHOSPHATASE: 65 U/L (ref 9–122)
BKR ANION GAP: 10 (ref 7–17)
BKR ASPARTATE AMINOTRANSFERASE (AST): 20 U/L (ref 10–35)
BKR AST/ALT RATIO: 1.3 x 1000/??L (ref 0.0–0.3)
BKR BILIRUBIN TOTAL: 0.3 mg/dL (ref <=1.2)
BKR BLOOD UREA NITROGEN: 7 mg/dL (ref 6–20)
BKR BUN / CREAT RATIO: 11.9 (ref 8.0–23.0)
BKR CALCIUM: 9.5 mg/dL (ref 8.8–10.2)
BKR CHLORIDE: 104 mmol/L (ref 98–107)
BKR CO2: 24 mmol/L (ref 20–30)
BKR CREATININE: 0.59 mg/dL (ref 0.40–1.30)
BKR EGFR (AFR AMER): >60 mL/min/{1.73_m2} (ref >60)
BKR EGFR (NON AFRICAN AMERICAN): >60 mL/min/1.73m2 — ABNORMAL HIGH (ref >60)
BKR GLOBULIN: 2.7 g/dL — ABNORMAL HIGH (ref 2.3–3.5)
BKR GLUCOSE: 70 mg/dL (ref 70–100)
BKR PH, UA: 11.9 /HPF — AB (ref 8.0–23.0)
BKR POTASSIUM: 4.3 mmol/L (ref 3.5–5.3)
BKR PROTEIN TOTAL: 7.3 g/dL (ref 6.6–8.7)
BKR SODIUM: 138 mmol/L (ref 136–144)

## 2020-01-18 LAB — C4 COMPLEMENT     (BH GH L LMW YH): BKR C4 COMPLEMENT: 33 mg/dL (ref 10–40)

## 2020-01-18 LAB — C3 COMPLEMENT: BKR C3 COMPLEMENT: 129 mg/dL (ref 90–180)

## 2020-01-18 LAB — C-REACTIVE PROTEIN     (CRP): BKR C-REACTIVE PROTEIN, HIGH SENSITIVITY: 2.4 mg/L

## 2020-01-18 LAB — SEDIMENTATION RATE (ESR): BKR SEDIMENTATION RATE, ERYTHROCYTE: 22 mm/h — ABNORMAL HIGH (ref 0–20)

## 2020-01-18 NOTE — Other
RheumatologyC3 129 from 140C4 33 from 33CRP 2.4 from 5.9ESR 22 from 33CMP unremarkableCBC grossly stable (HCT 36.3)

## 2020-01-30 ENCOUNTER — Telehealth: Admit: 2020-01-30 | Payer: PRIVATE HEALTH INSURANCE | Primary: Internal Medicine

## 2020-01-30 NOTE — Telephone Encounter
Call from [name] from [name]. Calling to follow up as pt overdue to fill B/P medications. Cathy Evans -last telemed visit on 10/2018.

## 2020-02-08 ENCOUNTER — Encounter: Admit: 2020-02-08 | Payer: PRIVATE HEALTH INSURANCE | Attending: Nephrology | Primary: Internal Medicine

## 2020-02-24 ENCOUNTER — Encounter: Admit: 2020-02-24 | Payer: PRIVATE HEALTH INSURANCE | Attending: Rheumatology | Primary: Internal Medicine

## 2020-02-24 DIAGNOSIS — M359 Systemic involvement of connective tissue, unspecified: Secondary | ICD-10-CM

## 2020-02-24 DIAGNOSIS — R768 Other specified abnormal immunological findings in serum: Secondary | ICD-10-CM

## 2020-02-25 MED ORDER — HYDROXYCHLOROQUINE 200 MG TABLET
200 mg | ORAL_TABLET | 1 refills | Status: AC
Start: 2020-02-25 — End: 2020-05-27

## 2020-02-25 NOTE — Telephone Encounter
Received medication refill request for PLQ 200 MG TABS    for Cathy Evans . Medication pended & routed to provider for review & approval if indicated.  Last visit: 4-20-21Next visit:  10-29-21Last labs:Lab Results Component Value Date  WBC 4.7 01/18/2020  HGB 12.4 01/18/2020  HCT 36.3 (L) 01/18/2020  MCV 88.3 01/18/2020  MCH 30.2 01/18/2020  MCHC 34.2 01/18/2020  PLT 273 01/18/2020  MPV 9.5 01/18/2020  NEUTROPHILS 43.6 01/18/2020  LYMPHOCYTES 40.1 01/18/2020  MONOCYTES 9.7 01/18/2020  EOSINOPHILS 5.3 01/18/2020  Lab Results Component Value Date  NA 138 01/18/2020  K 4.3 01/18/2020  CL 104 01/18/2020  CO2 24 01/18/2020  GLU 70 01/18/2020  BUN 7 01/18/2020  CREATININE 0.59 01/18/2020  EGFR >60 08/23/2012  EGFRAFRAMER >60 01/18/2020  CALCIUM 9.5 01/18/2020  ALBUMIN 4.6 01/18/2020  PROT 7.3 01/18/2020  BILITOT 0.3 01/18/2020  ALKPHOS 65 01/18/2020  ALT 15 01/18/2020  AST 20 01/18/2020  GLOB 2.7 01/18/2020  Lab Results Component Value Date  HSCRP 2.4 01/18/2020  SEDRATE 22 (H) 01/18/2020

## 2020-03-07 ENCOUNTER — Ambulatory Visit: Admit: 2020-03-07 | Payer: PRIVATE HEALTH INSURANCE | Attending: Rheumatology | Primary: Internal Medicine

## 2020-03-11 ENCOUNTER — Encounter: Admit: 2020-03-11 | Payer: PRIVATE HEALTH INSURANCE | Attending: Nephrology | Primary: Internal Medicine

## 2020-04-07 ENCOUNTER — Encounter: Admit: 2020-04-07 | Payer: PRIVATE HEALTH INSURANCE | Primary: Internal Medicine

## 2020-04-07 DIAGNOSIS — Z79899 Other long term (current) drug therapy: Secondary | ICD-10-CM

## 2020-04-07 DIAGNOSIS — I10 Essential (primary) hypertension: Secondary | ICD-10-CM

## 2020-04-07 DIAGNOSIS — H524 Presbyopia: Secondary | ICD-10-CM

## 2020-04-18 ENCOUNTER — Telehealth: Admit: 2020-04-18 | Payer: PRIVATE HEALTH INSURANCE | Attending: Nephrology | Primary: Internal Medicine

## 2020-04-18 NOTE — Telephone Encounter
Received call from patient.Asking for order for UA/Ucx.Believes she has UTI.Reports burning with urination as well as foul smelling urine.Afebrile.Denies back pain.Reports urine is intermittently cloudy.Reports symptoms x1 week.Advised patient reach out to PCP regarding symptoms-she does not have an official PCP.Informed patient UA/UCx is ordered from 09/2019 and order is good for a year.She plans to go to lab tomorrow.Will rt to Dr. Celedonio Savage and Alla Feeling as Lorain Childes and to look out for lab results.

## 2020-04-22 ENCOUNTER — Telehealth: Admit: 2020-04-22 | Payer: PRIVATE HEALTH INSURANCE | Primary: Internal Medicine

## 2020-04-22 ENCOUNTER — Inpatient Hospital Stay: Admit: 2020-04-22 | Discharge: 2020-04-22 | Payer: PRIVATE HEALTH INSURANCE | Primary: Internal Medicine

## 2020-04-22 ENCOUNTER — Encounter: Admit: 2020-04-22 | Payer: PRIVATE HEALTH INSURANCE | Attending: Nephrology | Primary: Internal Medicine

## 2020-04-22 DIAGNOSIS — R109 Unspecified abdominal pain: Secondary | ICD-10-CM

## 2020-04-22 LAB — URINALYSIS-MACROSCOPIC W/REFLEX MICROSCOPIC
BKR BILIRUBIN, UA: NEGATIVE
BKR BLOOD, UA: NEGATIVE
BKR GLUCOSE, UA: NEGATIVE
BKR KETONES, UA: NEGATIVE
BKR LEUKOCYTE ESTERASE, UA: POSITIVE — AB
BKR NITRITE, UA: POSITIVE — AB
BKR PROTEIN, UA: NEGATIVE
BKR SPECIFIC GRAVITY, UA: 1.015 (ref 1.005–1.020)
BKR UROBILINOGEN, UA: 0.2 EU/dL (ref <2.0)

## 2020-04-22 LAB — URINE MICROSCOPIC     (BH GH LMW YH)

## 2020-04-22 MED ORDER — CIPROFLOXACIN 500 MG TABLET
500 mg | ORAL_TABLET | Freq: Two times a day (BID) | ORAL | 1 refills | Status: AC
Start: 2020-04-22 — End: ?

## 2020-04-22 NOTE — Other
Can you please let her know that she has a UTI and I have sent in antibiotics for her? Thanks!

## 2020-04-22 NOTE — Telephone Encounter
Called and spoke with the pt and advised her of the message from [facility].  Pt acknowledged understanding and had no further questions.

## 2020-04-22 NOTE — Telephone Encounter
-----   Message from Anette Riedel, APRN sent at 04/22/2020  4:57 PM EST -----Can you please let her know that she has a UTI and I have sent in antibiotics for her? Thanks!

## 2020-04-24 LAB — URINE CULTURE: BKR URINE CULTURE, ROUTINE: >=100000 % — AB (ref 11.5–14.5)

## 2020-04-24 NOTE — Other
Cathy Evans started her on Cipro yesterday.

## 2020-05-05 ENCOUNTER — Encounter: Admit: 2020-05-05 | Payer: PRIVATE HEALTH INSURANCE | Attending: Nephrology | Primary: Internal Medicine

## 2020-05-05 ENCOUNTER — Inpatient Hospital Stay: Admit: 2020-05-05 | Discharge: 2020-05-05 | Payer: PRIVATE HEALTH INSURANCE | Primary: Internal Medicine

## 2020-05-05 ENCOUNTER — Telehealth: Admit: 2020-05-05 | Payer: PRIVATE HEALTH INSURANCE | Attending: Nephrology | Primary: Internal Medicine

## 2020-05-05 DIAGNOSIS — R3589 Other polyuria: Secondary | ICD-10-CM

## 2020-05-05 DIAGNOSIS — Q612 Polycystic kidney, adult type: Secondary | ICD-10-CM

## 2020-05-05 NOTE — Telephone Encounter
Received a call from pt She was recently treated for UTI with Cipro- she completed course of AbxBut continues to c/o left lower flank plain described as pins/needle like twingesAnd frequent urinationShe denies any fever, chills , dysuria or obvious bloodShe asks if she should have another urine cx Will defer to APRN Elyssa for recommendations She also asked about COVID testing , no covid symptoms but may have been exposed during holidayPt warm transferred to COVID hotline [facility]

## 2020-05-05 NOTE — Telephone Encounter
Contacted ptInformed pt of labs and repeat urine sampleNo fasting needed- she will go to lab today Pt not had A1C checked since 04/2019- was 5.5 at that timePt appreciative of prompt response

## 2020-05-06 LAB — CBC WITH AUTO DIFFERENTIAL
BKR WAM ABSOLUTE IMMATURE GRANULOCYTES.: 0.01 x 1000/??L (ref 0.00–0.30)
BKR WAM ABSOLUTE LYMPHOCYTE COUNT.: 1.34 x 1000/??L (ref 0.60–3.70)
BKR WAM ABSOLUTE NRBC (2 DEC): 0 x 1000/??L (ref 0.00–1.00)
BKR WAM ANALYZER ANC: 0.79 x 1000/??L — ABNORMAL LOW (ref 2.00–7.60)
BKR WAM BASOPHIL ABSOLUTE COUNT.: 0.02 x 1000/??L (ref 0.00–1.00)
BKR WAM BASOPHILS: 0.7 % (ref 0.0–1.4)
BKR WAM EOSINOPHIL ABSOLUTE COUNT.: 0.13 x 1000/??L (ref 0.00–1.00)
BKR WAM EOSINOPHILS: 4.9 % (ref 0.0–5.0)
BKR WAM HEMATOCRIT (2 DEC): 39.3 % (ref 35.00–45.00)
BKR WAM HEMOGLOBIN: 12.9 g/dL (ref 11.7–15.5)
BKR WAM IMMATURE GRANULOCYTES: 0.4 % (ref 0.0–1.0)
BKR WAM LYMPHOCYTES: 50 % (ref 17.0–50.0)
BKR WAM MCH (PG): 29.3 pg (ref 27.0–33.0)
BKR WAM MCHC: 32.8 g/dL — AB (ref 31.0–36.0)
BKR WAM MCV: 89.3 fL (ref 80.0–100.0)
BKR WAM MONOCYTE ABSOLUTE COUNT.: 0.39 x 1000/??L (ref 0.00–1.00)
BKR WAM MONOCYTES: 14.6 % — ABNORMAL HIGH (ref 4.0–12.0)
BKR WAM MPV: 9.3 fL (ref 8.0–12.0)
BKR WAM NEUTROPHILS: 29.4 % — ABNORMAL LOW (ref 39.0–72.0)
BKR WAM NUCLEATED RED BLOOD CELLS: 0 % (ref 0.0–1.0)
BKR WAM PLATELETS: 223 x1000/ÂµL (ref 150–420)
BKR WAM RDW-CV: 13.1 % (ref 11.0–15.0)
BKR WAM RED BLOOD CELL COUNT.: 4.4 M/ÂµL (ref 4.00–6.00)
BKR WAM WHITE BLOOD CELL COUNT: 2.7 x1000/ÂµL — ABNORMAL LOW (ref 4.0–11.0)

## 2020-05-06 LAB — BASIC METABOLIC PANEL
BKR ANION GAP: 11 g/dL (ref 7–17)
BKR BLOOD UREA NITROGEN: 6 mg/dL (ref 6–20)
BKR BUN / CREAT RATIO: 10.3 (ref 8.0–23.0)
BKR CALCIUM: 9.4 mg/dL (ref 8.8–10.2)
BKR CHLORIDE: 101 mmol/L — ABNORMAL LOW (ref 98–107)
BKR CO2: 27 mmol/L (ref 20–30)
BKR CREATININE: 0.58 mg/dL (ref 0.40–1.30)
BKR EGFR (AFR AMER): >60 mL/min/{1.73_m2} (ref >60)
BKR EGFR (NON AFRICAN AMERICAN): >60 mL/min/{1.73_m2} (ref >60)
BKR GLUCOSE: 108 mg/dL — ABNORMAL HIGH (ref 70–100)
BKR POTASSIUM: 4.1 mmol/L (ref 3.3–5.3)
BKR SODIUM: 139 mmol/L (ref 136–144)

## 2020-05-06 LAB — URINE MICROSCOPIC     (BH GH LMW YH)
BKR BILIRUBIN, UA: 2 /HPF — ABNORMAL LOW (ref 0–2)
BKR HYALINE CASTS, UA INSTRUMENT (NUMERIC): 5 /LPF — ABNORMAL HIGH (ref 0–3)
BKR RBC/HPF INSTRUMENT: 2 /HPF (ref 0–2)
BKR WBC/HPF INSTRUMENT: 2 /HPF (ref 0–5)

## 2020-05-06 LAB — URINE CULTURE

## 2020-05-06 LAB — HEMOGLOBIN A1C
BKR ESTIMATED AVERAGE GLUCOSE: 120 mg/dL
BKR HEMOGLOBIN A1C: 5.8 % — ABNORMAL HIGH (ref 4.0–5.6)
BKR WAM MCH (PG): 120 mg/dL (ref 27.0–31.0)

## 2020-05-06 LAB — URINALYSIS-MACROSCOPIC W/REFLEX MICROSCOPIC
BKR BLOOD, UA: NEGATIVE
BKR GLUCOSE, UA: NEGATIVE
BKR KETONES, UA: NEGATIVE
BKR LEUKOCYTE ESTERASE, UA: NEGATIVE % (ref 17.0–50.0)
BKR NITRITE, UA: NEGATIVE
BKR PH, UA: 6 (ref 5.5–7.5)
BKR SPECIFIC GRAVITY, UA: 1.015 (ref 1.005–1.030)
BKR UROBILINOGEN, UA: <2 EU/dL (ref <=2.0)

## 2020-05-06 NOTE — Other
Labs reviewed. UA is negative, no bacteria on microscopy. UTI has cleared. Kidney function is stable on labs as well. She can use Tylenol as needed for flank pain.Nursing team, please let her know.  Thanks!

## 2020-05-06 NOTE — Other
Attempted to call pt, left message to call [facility] with any questions, provider sent mychart message

## 2020-05-23 ENCOUNTER — Telehealth: Admit: 2020-05-23 | Payer: PRIVATE HEALTH INSURANCE | Attending: Obstetrics and Gynecology | Primary: Internal Medicine

## 2020-05-23 ENCOUNTER — Encounter: Admit: 2020-05-23 | Payer: PRIVATE HEALTH INSURANCE | Attending: Obstetrics and Gynecology | Primary: Internal Medicine

## 2020-05-23 DIAGNOSIS — N644 Mastodynia: Secondary | ICD-10-CM

## 2020-05-23 NOTE — Telephone Encounter
Patient called to report her breast is very Tender and itchy, it's about for two weeks and She is requesting the Provider place a mammo order. Please call her back

## 2020-05-26 ENCOUNTER — Encounter: Admit: 2020-05-26 | Payer: PRIVATE HEALTH INSURANCE | Attending: Rheumatology | Primary: Internal Medicine

## 2020-05-26 DIAGNOSIS — M359 Systemic involvement of connective tissue, unspecified: Secondary | ICD-10-CM

## 2020-05-26 DIAGNOSIS — R768 Other specified abnormal immunological findings in serum: Secondary | ICD-10-CM

## 2020-05-27 ENCOUNTER — Encounter: Admit: 2020-05-27 | Payer: PRIVATE HEALTH INSURANCE | Attending: Obstetrics and Gynecology | Primary: Internal Medicine

## 2020-05-27 DIAGNOSIS — N644 Mastodynia: Secondary | ICD-10-CM

## 2020-05-27 MED ORDER — HYDROXYCHLOROQUINE 200 MG TABLET
200 mg | ORAL_TABLET | 1 refills | Status: AC
Start: 2020-05-27 — End: 2020-12-12

## 2020-05-27 NOTE — Telephone Encounter
Received medication refill request for PLQ 200 MG TABS   for Cathy Evans . Medication pended & routed to provider for review & approval if indicated.  Last visit: 4-20-2021Next visit:Last labs:Lab Results Component Value Date  WBC 2.7 (L) 05/05/2020  HGB 12.9 05/05/2020  HCT 39.30 05/05/2020  MCV 89.3 05/05/2020  MCH 29.3 05/05/2020  MCHC 32.8 05/05/2020  PLT 223 05/05/2020  MPV 9.3 05/05/2020  NEUTROPHILS 29.4 (L) 05/05/2020  LYMPHOCYTES 50.0 05/05/2020  MONOCYTES 14.6 (H) 05/05/2020  EOSINOPHILS 4.9 05/05/2020  Lab Results Component Value Date  NA 139 05/05/2020  K 4.1 05/05/2020  CL 101 05/05/2020  CO2 27 05/05/2020  GLU 108 (H) 05/05/2020  BUN 6 05/05/2020  CREATININE 0.58 05/05/2020  EGFR >60 08/23/2012  EGFRAFRAMER >60 05/05/2020  CALCIUM 9.4 05/05/2020  ALBUMIN 4.6 01/18/2020  PROT 7.3 01/18/2020  BILITOT 0.3 01/18/2020  ALKPHOS 65 01/18/2020  ALT 15 01/18/2020  AST 20 01/18/2020  GLOB 2.7 01/18/2020  Lab Results Component Value Date  HSCRP 2.4 01/18/2020  SEDRATE 22 (H) 01/18/2020

## 2020-06-06 ENCOUNTER — Inpatient Hospital Stay: Admit: 2020-06-06 | Discharge: 2020-06-06 | Payer: PRIVATE HEALTH INSURANCE | Primary: Internal Medicine

## 2020-06-06 ENCOUNTER — Encounter: Admit: 2020-06-06 | Payer: PRIVATE HEALTH INSURANCE | Attending: Nephrology | Primary: Internal Medicine

## 2020-06-06 ENCOUNTER — Ambulatory Visit: Admit: 2020-06-06 | Payer: PRIVATE HEALTH INSURANCE | Attending: Nephrology | Primary: Internal Medicine

## 2020-06-06 ENCOUNTER — Ambulatory Visit: Admit: 2020-06-06 | Payer: PRIVATE HEALTH INSURANCE | Attending: Internal Medicine | Primary: Internal Medicine

## 2020-06-06 DIAGNOSIS — N181 Chronic kidney disease, stage 1: Secondary | ICD-10-CM

## 2020-06-06 DIAGNOSIS — E559 Vitamin D deficiency, unspecified: Secondary | ICD-10-CM

## 2020-06-06 DIAGNOSIS — I10 Essential (primary) hypertension: Secondary | ICD-10-CM

## 2020-06-06 DIAGNOSIS — R002 Palpitations: Secondary | ICD-10-CM

## 2020-06-06 DIAGNOSIS — Z0000 Encounter for general adult medical examination without abnormal findings: Secondary | ICD-10-CM

## 2020-06-06 DIAGNOSIS — I341 Nonrheumatic mitral (valve) prolapse: Secondary | ICD-10-CM

## 2020-06-06 DIAGNOSIS — Q613 Polycystic kidney, unspecified: Secondary | ICD-10-CM

## 2020-06-06 DIAGNOSIS — N189 Chronic kidney disease, unspecified: Secondary | ICD-10-CM

## 2020-06-06 DIAGNOSIS — D573 Sickle-cell trait: Secondary | ICD-10-CM

## 2020-06-06 DIAGNOSIS — N184 Chronic kidney disease, stage 4 (severe): Secondary | ICD-10-CM

## 2020-06-06 MED ORDER — CYANOCOBALAMIN (VIT B-12) 100 MCG TABLET
100 MCG | Freq: Every day | ORAL | Status: AC
Start: 2020-06-06 — End: 2020-08-26

## 2020-06-06 MED ORDER — ELDERBERRY FRUIT ORAL
ORAL | Status: AC
Start: 2020-06-06 — End: 2020-08-26

## 2020-06-06 MED ORDER — MAGNESIUM L-LACTATE ER 84 MG TABLET,EXTENDED RELEASE
84 mg | Freq: Every day | ORAL | Status: AC
Start: 2020-06-06 — End: 2020-07-16

## 2020-06-06 MED ORDER — ZINC-15 ORAL
ORAL | Status: AC
Start: 2020-06-06 — End: 2020-08-26

## 2020-06-07 LAB — HIV-1/HIV-2 ANTIBODY/ANTIGEN SCREEN W/REFLEX     (BH GH LMW YH): BKR HIV 1 AND 2 ANTIBODY/HIV-1 ANTIGEN SCREEN: NEGATIVE

## 2020-06-07 LAB — BASIC METABOLIC PANEL
BKR ANION GAP: 13 (ref 7–17)
BKR BLOOD UREA NITROGEN: 7 mg/dL (ref 6–20)
BKR BUN / CREAT RATIO: 12.7 (ref 8.0–23.0)
BKR CALCIUM: 9.9 mg/dL (ref 8.8–10.2)
BKR CHLORIDE: 102 mmol/L (ref 98–107)
BKR CO2: 25 mmol/L (ref 20–30)
BKR CREATININE: 0.55 mg/dL (ref 0.40–1.30)
BKR EGFR (AFR AMER): >60 mL/min/{1.73_m2} (ref >60)
BKR EGFR (NON AFRICAN AMERICAN): >60 mL/min/{1.73_m2} (ref >60)
BKR GLUCOSE: 93 mg/dL (ref 70–100)
BKR POTASSIUM: 4.5 mmol/L (ref 3.3–5.3)
BKR SODIUM: 140 mmol/L (ref 136–144)

## 2020-06-09 ENCOUNTER — Encounter: Admit: 2020-06-09 | Payer: PRIVATE HEALTH INSURANCE | Primary: Internal Medicine

## 2020-06-17 ENCOUNTER — Inpatient Hospital Stay: Admit: 2020-06-17 | Discharge: 2020-06-17 | Payer: PRIVATE HEALTH INSURANCE | Primary: Internal Medicine

## 2020-06-17 DIAGNOSIS — N644 Mastodynia: Secondary | ICD-10-CM

## 2020-06-27 ENCOUNTER — Inpatient Hospital Stay: Admit: 2020-06-27 | Discharge: 2020-06-27 | Payer: PRIVATE HEALTH INSURANCE

## 2020-06-27 ENCOUNTER — Ambulatory Visit: Admit: 2020-06-27 | Payer: PRIVATE HEALTH INSURANCE | Attending: Internal Medicine | Primary: Internal Medicine

## 2020-06-27 ENCOUNTER — Emergency Department: Admit: 2020-06-27 | Payer: PRIVATE HEALTH INSURANCE | Primary: Internal Medicine

## 2020-06-27 ENCOUNTER — Encounter: Admit: 2020-06-27 | Payer: PRIVATE HEALTH INSURANCE | Primary: Internal Medicine

## 2020-06-27 DIAGNOSIS — N189 Chronic kidney disease, unspecified: Secondary | ICD-10-CM

## 2020-06-27 DIAGNOSIS — Q613 Polycystic kidney, unspecified: Secondary | ICD-10-CM

## 2020-06-27 DIAGNOSIS — I10 Essential (primary) hypertension: Secondary | ICD-10-CM

## 2020-06-27 DIAGNOSIS — I341 Nonrheumatic mitral (valve) prolapse: Secondary | ICD-10-CM

## 2020-06-27 DIAGNOSIS — E559 Vitamin D deficiency, unspecified: Secondary | ICD-10-CM

## 2020-06-27 DIAGNOSIS — R002 Palpitations: Secondary | ICD-10-CM

## 2020-06-27 DIAGNOSIS — D573 Sickle-cell trait: Secondary | ICD-10-CM

## 2020-06-27 LAB — CBC WITH AUTO DIFFERENTIAL
BKR WAM ABSOLUTE IMMATURE GRANULOCYTES.: 0.01 x 1000/ÂµL (ref 0.00–0.30)
BKR WAM ABSOLUTE LYMPHOCYTE COUNT.: 1.98 x 1000/ÂµL (ref 0.60–3.70)
BKR WAM ABSOLUTE NRBC (2 DEC): 0 x 1000/ÂµL (ref 0.00–1.00)
BKR WAM ANALYZER ANC: 3.11 x 1000/ÂµL (ref 2.00–7.60)
BKR WAM BASOPHIL ABSOLUTE COUNT.: 0.05 x 1000/ÂµL (ref 0.00–1.00)
BKR WAM BASOPHILS: 0.9 % (ref 0.0–1.4)
BKR WAM EOSINOPHIL ABSOLUTE COUNT.: 0.24 x 1000/ÂµL (ref 0.00–1.00)
BKR WAM EOSINOPHILS: 4.1 % (ref 0.0–5.0)
BKR WAM HEMATOCRIT (2 DEC): 38.7 % (ref 35.00–45.00)
BKR WAM HEMOGLOBIN: 13.1 g/dL (ref 11.7–15.5)
BKR WAM IMMATURE GRANULOCYTES: 0.2 % (ref 0.0–1.0)
BKR WAM LYMPHOCYTES: 33.7 % (ref 17.0–50.0)
BKR WAM MCH (PG): 29.9 pg (ref 27.0–33.0)
BKR WAM MCHC: 33.9 g/dL (ref 31.0–36.0)
BKR WAM MCV: 88.4 fL (ref 80.0–100.0)
BKR WAM MONOCYTE ABSOLUTE COUNT.: 0.48 x 1000/ÂµL (ref 0.00–1.00)
BKR WAM MONOCYTES: 8.2 % (ref 4.0–12.0)
BKR WAM MPV: 9.1 fL (ref 8.0–12.0)
BKR WAM NEUTROPHILS: 52.9 % (ref 39.0–72.0)
BKR WAM NUCLEATED RED BLOOD CELLS: 0 % (ref 0.0–1.0)
BKR WAM PLATELETS: 287 x1000/ÂµL (ref 150–420)
BKR WAM RDW-CV: 12.9 % (ref 11.0–15.0)
BKR WAM RED BLOOD CELL COUNT.: 4.38 M/ÂµL (ref 4.00–6.00)
BKR WAM WHITE BLOOD CELL COUNT: 5.9 x1000/ÂµL (ref 4.0–11.0)

## 2020-06-27 LAB — PHOSPHORUS     (BH GH L LMW YH): BKR PHOSPHORUS: 3.5 mg/dL (ref 2.2–4.5)

## 2020-06-27 LAB — TROPONIN T HIGH SENSITIVITY, 1 HOUR WITH REFLEX (BH GH LMW YH)
BKR TROPONIN T HS 1 HOUR DELTA FROM 0 HOUR: 0 ng/L
BKR TROPONIN T HS 1 HOUR: <6 ng/L

## 2020-06-27 LAB — BASIC METABOLIC PANEL
BKR ANION GAP: 15 (ref 7–17)
BKR BLOOD UREA NITROGEN: 7 mg/dL (ref 6–20)
BKR BUN / CREAT RATIO: 14 (ref 8.0–23.0)
BKR CALCIUM: 10.2 mg/dL (ref 8.8–10.2)
BKR CHLORIDE: 104 mmol/L (ref 98–107)
BKR CO2: 22 mmol/L (ref 20–30)
BKR CREATININE: 0.5 mg/dL (ref 0.40–1.30)
BKR EGFR (AFR AMER): >60 mL/min/{1.73_m2} (ref >60)
BKR EGFR (NON AFRICAN AMERICAN): >60 mL/min/{1.73_m2} (ref >60)
BKR GLUCOSE: 102 mg/dL — ABNORMAL HIGH (ref 70–100)
BKR POTASSIUM: 3.7 mmol/L (ref 3.3–5.3)
BKR SODIUM: 141 mmol/L (ref 136–144)

## 2020-06-27 LAB — TROPONIN T HIGH SENSITIVITY, 0 HOUR BASELINE WITH REFLEX (BH GH LMW YH): BKR TROPONIN T HS 0 HOUR BASELINE: <6 ng/L

## 2020-06-27 LAB — MAGNESIUM: BKR MAGNESIUM: 2 mg/dL (ref 1.7–2.4)

## 2020-06-27 MED ORDER — LORAZEPAM 0.5 MG TABLET
0.5 mg | Freq: Once | ORAL | Status: CP
Start: 2020-06-27 — End: ?
  Administered 2020-06-27: 22:00:00 0.5 mg via ORAL

## 2020-06-27 NOTE — Discharge Instructions
Return to emergency department for evaluation of fast heart rate, palpitations, chest pain, shortness of breath, her for symptoms worsen for any reason.  Otherwise follow up with Cardiology and where your cardiac monitor home.

## 2020-06-28 DIAGNOSIS — Z79899 Other long term (current) drug therapy: Secondary | ICD-10-CM

## 2020-06-28 DIAGNOSIS — Z882 Allergy status to sulfonamides status: Secondary | ICD-10-CM

## 2020-06-28 DIAGNOSIS — I341 Nonrheumatic mitral (valve) prolapse: Secondary | ICD-10-CM

## 2020-06-28 DIAGNOSIS — D573 Sickle-cell trait: Secondary | ICD-10-CM

## 2020-06-28 DIAGNOSIS — Q613 Polycystic kidney, unspecified: Secondary | ICD-10-CM

## 2020-06-28 DIAGNOSIS — Z880 Allergy status to penicillin: Secondary | ICD-10-CM

## 2020-06-28 DIAGNOSIS — R002 Palpitations: Secondary | ICD-10-CM

## 2020-06-28 DIAGNOSIS — I129 Hypertensive chronic kidney disease with stage 1 through stage 4 chronic kidney disease, or unspecified chronic kidney disease: Secondary | ICD-10-CM

## 2020-06-28 DIAGNOSIS — Z7722 Contact with and (suspected) exposure to environmental tobacco smoke (acute) (chronic): Secondary | ICD-10-CM

## 2020-06-28 DIAGNOSIS — N189 Chronic kidney disease, unspecified: Secondary | ICD-10-CM

## 2020-06-28 NOTE — ED Notes
Chief complaintPatient arrives via EMS with c/o panic attack/tachycardia.  Patient from home, hx of anxiety, hx of palpitations. Symptoms present x4 days, but has not addressed them.  Patient with multiple life stressors. Pt does have a cardiologist and does take antihypertensive medications.  Patient arrives with BP 120/80s, hr 95.PMHPast Medical History: Diagnosis Date ? Chronic kidney disease   PKD, but normal creatinine ? Hypertension  ? Mitral valve prolapse  ? Palpitations  ? Polycystic kidney disease  ? Sickle cell trait (HC Code) (HC CODE)   Natera testing 2021 ? Vitamin D deficiency  Vital SignsVitals:  06/27/20 1534 BP: 129/82 Pulse: (!) 96 Resp: 16 Temp: 97.7 ?F (36.5 ?C) Nursing InterventionsPatient changed into hospital gown, placed on monitor, educated on plan of care, educated on surrounding unit. ekg done by EDT. AddendumsxxxDischarge/DispoOrder in place for discharge placed by provider.  Discharge paperwork reviewed with patient, they verbalize understanding of discharge instructions.  Patient to follow up with PCP/specialty as per their DC paperwork.  Patient to return to ED with worsening signs or symptoms.  Patient agreeable with discharge plan.  Patient with safe ride home. No questions or concerns verbalized at time of discharge. Patient with steady gait to exit.

## 2020-06-30 ENCOUNTER — Encounter: Admit: 2020-06-30 | Payer: PRIVATE HEALTH INSURANCE | Attending: Cardiovascular Disease | Primary: Internal Medicine

## 2020-06-30 DIAGNOSIS — D573 Sickle-cell trait: Secondary | ICD-10-CM

## 2020-06-30 DIAGNOSIS — Q613 Polycystic kidney, unspecified: Secondary | ICD-10-CM

## 2020-06-30 DIAGNOSIS — I10 Essential (primary) hypertension: Secondary | ICD-10-CM

## 2020-06-30 DIAGNOSIS — R002 Palpitations: Secondary | ICD-10-CM

## 2020-06-30 DIAGNOSIS — E559 Vitamin D deficiency, unspecified: Secondary | ICD-10-CM

## 2020-06-30 DIAGNOSIS — N189 Chronic kidney disease, unspecified: Secondary | ICD-10-CM

## 2020-06-30 NOTE — Progress Notes
[facility]                               Specialty Programs:	Cardio-Renal 		Kidney Stones			Glomerular Diseases	Onco- Nephrology			Inherited Diseases	Kidney Disease in Pregnancy			Hypertension		Chronic Kidney DiseaseY[facility] 2nd [facility] [facility] [place], [place] [address] [phone] was a telehealth visit conducted during the COVID-19 pandemic.Encounter Date: 01/28/22Primary Care Provider: Damien Fusi Primary conditions for which we are involved:Polycystic kidney disease?Update about patient's health, including new symptoms:I saw?Cathy Evans on?June 06, 2020 ?for further evaluation and management of polycystic kidney disease. ??The patient is a 55 year-old woman who does not have a family history of?PKD.??Her father developed end-stage renal disease and underwent a kidney transplant, but she thinks that may have been due to diabetic kidney disease. ?She has a history of both kidney and liver cysts and hypertension, but has never had significant loss of renal function. She previously had anemia, but this was due to iron deficiency, and has resolved completely. 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  She is c/o headache, and occasional nosebleeds.  We reviewed her labs--she had a low WBC, and pre-diabetes with a HBA1c of 5.8%.  She had COVID (12/21).  Past Medical HistoryMerashe Evans has a past medical history of Chronic kidney disease, Hypertension, Mitral valve prolapse, Palpitations, Polycystic kidney disease, and Vitamin D deficiency.Medications Current Outpatient Medications Medication Sig Dispense Refill ? ascorbic acid (VITAMIN C ORAL) Take 500 mg by mouth.    ? cyanocobalamin (VITAMIN B-12) 100 MCG tablet Take 100 mcg by mouth daily.   ? ergocalciferol, vitamin D2, (VITAMIN D ORAL) Take 1,000 Units by mouth.    ? hydrOXYchloroQUINE (PLAQUENIL) 200 mg tablet TAKE 1 TABLET(200 MG) BY MOUTH DAILY 180 tablet 0 ? Lactobacillus acidophilus (PROBIOTIC ORAL) Take by mouth.   ? lisinopriL (PRINIVIL,ZESTRIL) 5 mg tablet Take 1 tablet (5 mg total) by mouth daily. 90 tablet 2 ? multivitamin capsule Take 1 capsule by mouth daily.     ? zinc sulfate (ZINC-15 ORAL) Take by mouth.   ? lidocaine (LIDODERM) 5 % Place 1 patch onto the skin daily. Remove & Discard patch within 12 hours or as directed by MD (Patient not taking: Reported on 06/03/2020) 30 patch 11 No current facility-administered medications for this visit. Relevant vitals and labsBP Readings from Last 3 Encounters: 10/19/19 (!) 142/80 09/14/19 124/84 07/04/18 130/80 Lab Results Component Value Date  WBC 2.7 (L) 05/05/2020  HGB 12.9 05/05/2020  HCT 39.30 05/05/2020  PLT 223 05/05/2020  GLU 108 (H) 05/05/2020  BUN 6 05/05/2020  CREATININE 0.58 05/05/2020  CO2 27 05/05/2020  CL 101 05/05/2020  NA 139 05/05/2020  K 4.1 05/05/2020  CALCIUM 9.4 05/05/2020  EGFR >60 08/23/2012  PRCRRAU 0.06 12/11/2018  PHOS 4.0 09/14/2019  HGBA1C 5.8 (H) 05/05/2020  IRON 109 05/01/2019  TIBC 335 05/01/2019  FERRITIN 98 05/01/2019 US RENAL - completed 09/14/19?COMPARISON: 01/11/2019.?INDICATION: Left flank pain.?FINDINGS: The right kidney measures 11.7 cm in length. The left kidney measures 12.1 cm in length. Multiple bilateral renal cysts are noted. On the right, there is a 2.5 x 2.4 x 2.7 cm simple cyst in the mid-lower pole and a 2.9 x 1.5 x 2.7 cm septated cyst laterally in the lower pole (previously measuring 2.2 x 1.3 x 1.8 cm). On the left, there is a 1.2 x 1.3 x 1.4 cm cyst  with possible debris in the mid-upper pole. There is no evidence of nephrolithiasis or hydronephrosis. Mild fullness of the right renal pelvis is noted.?The urinary bladder is partially distended. Bilateral ureteral jets are visualized.?   IMPRESSION: 1. No evidence of nephrolithiasis or hydronephrosis.2. Bilateral renal cysts as above. Follow-up renal ultrasound in one year is recommended.?Reported And Signed By: Kathrin Greathouse, MD-------------------------------------------------------------------------------------------------------------------------------------------------------------------------------------------------------------Assessment and Plan:In summary, this is a 55 year old woman with a history of cystic kidneys and a family history of end-stage renal disease.  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 Her kidney function is stable and her blood pressure seems adequately controlled.  Given that she has a single APOL1 risk allele, I want to make sure that her kidney function is stable following her recent COVID infection and therefore, I am rechecking it although it was quite stable 1 month ago.  From a kidney perspective, she should continue to do well.  I have asked to see her again in about 6 months. Thank you for involving me in her care.-----------------------------------------------------------------------------------------------------------------------------------I have counseled about COVID19 - specifically advising on social distancing, hand washing, staying home when sick. I gave them the [facility] hotline number if they have questions: [phone] have reminded them of our office number should they need a more urgent appointment. VIDEO TELEHEALTH VISIT: This clinician is part of the telehealth program and is conducting this visit in a currently approved location. For this visit the clinician and patient were present via interactive audio & video telecommunications system that permits real-time communications, via the [employer].Patient's use of the telehealth platform followed consent and acknowledges agreement to permit telehealth for this visit. State patient is located in: [place]The clinician is appropriately licensed in the above state to provide care for this visit. Other individuals present during the telehealth encounter and their role/relation: noneIf billing based on time, please complete (Not required if billing based on MDM):                           Total time spent in medical video consultation: 15 min; Total time spent by the provider on the day of service, which includes time spent on chart review, medical video consultation, education, coordination of care/services and counseling 20 min Because this visit was completed over video, a hands-on physical exam was not performed.  Patient/parent or guardian understands and knows to call back if condition changes.Scribed for Ether Griffins, MD by Garvin Fila, medical scribe January 28, 2022The documentation recorded by the scribe accurately reflects the services I personally performed and the decisions made by me. I reviewed and confirmed all material entered and/or pre-charted by the scribe.

## 2020-07-03 ENCOUNTER — Encounter: Admit: 2020-07-03 | Payer: PRIVATE HEALTH INSURANCE | Attending: Cardiovascular Disease | Primary: Internal Medicine

## 2020-07-03 DIAGNOSIS — I10 Essential (primary) hypertension: Secondary | ICD-10-CM

## 2020-07-03 DIAGNOSIS — I4719 Atrial tachycardia: Secondary | ICD-10-CM

## 2020-07-03 DIAGNOSIS — R42 Dizziness and giddiness: Secondary | ICD-10-CM

## 2020-07-03 DIAGNOSIS — R002 Palpitations: Secondary | ICD-10-CM

## 2020-07-03 DIAGNOSIS — R0602 Shortness of breath: Secondary | ICD-10-CM

## 2020-07-03 DIAGNOSIS — E559 Vitamin D deficiency, unspecified: Secondary | ICD-10-CM

## 2020-07-03 DIAGNOSIS — I341 Nonrheumatic mitral (valve) prolapse: Secondary | ICD-10-CM

## 2020-07-03 DIAGNOSIS — Q613 Polycystic kidney, unspecified: Secondary | ICD-10-CM

## 2020-07-03 DIAGNOSIS — D573 Sickle-cell trait: Secondary | ICD-10-CM

## 2020-07-03 DIAGNOSIS — N189 Chronic kidney disease, unspecified: Secondary | ICD-10-CM

## 2020-07-03 NOTE — Progress Notes
7 day event monitor placed [id number]. Patient verbally understands.

## 2020-07-03 NOTE — Patient Instructions
Echocardiogram.Cardiac event monitor.Increase hydration.Think about the pedal exerciser.  Start taking walks as the weather improves.  You need some form of physical exercise.Stress relief activities.Okay to take Pepcid over the counter for your reflux.  Avoid foods that would worsen reflux.  Follow up with Dr. Brayton El regarding the other symptoms, or gastroenterology.Gastroesophageal Reflux Disease, AdultGastroesophageal reflux (GER) happens when acid from the stomach flows up into the tube that connects the mouth and the stomach (esophagus). Normally, food travels down the esophagus and stays in the stomach to be digested. With GER, food and stomach acid sometimes move back up into the esophagus.You may have a disease called gastroesophageal reflux disease (GERD) if the reflux:?	Happens often.?	Causes frequent or very bad symptoms.?	Causes problems such as damage to the esophagus.When this happens, the esophagus becomes sore and swollen. Over time, GERD can make small holes (ulcers) in the lining of the esophagus.What are the causes?This condition is caused by a problem with the muscle between the esophagus and the stomach. When this muscle is weak or not normal, it does not close properly to keep food and acid from coming back up from the stomach.The muscle can be weak because of:?	Tobacco use.?	Pregnancy.?	Having a certain type of hernia (hiatal hernia).?	Alcohol use.?	Certain foods and drinks, such as coffee, chocolate, onions, and peppermint.What increases the risk??	Being overweight.?	Having a disease that affects your connective tissue.?	Taking NSAIDs, such a ibuprofen.What are the signs or symptoms??	Heartburn.?	Difficult or painful swallowing.?	The feeling of having a lump in the throat.?	A bitter taste in the mouth.?	Bad breath.?	Having a lot of saliva.?	Having an upset or bloated stomach.?	Burping.?	Chest pain. Different conditions can cause chest pain. Make sure you see your doctor if you have chest pain.?	Shortness of breath or wheezing.?	A long-term cough or a cough at night.?	Wearing away of the surface of teeth (tooth enamel).?	Weight loss.How is this treated??	Making changes to your diet.?	Taking medicine.?	Having surgery.Treatment will depend on how bad your symptoms are.Follow these instructions at home:Eating and drinking?	Follow a diet as told by your doctor. You may need to avoid foods and drinks such as:?	Coffee and tea, with or without caffeine.?	Drinks that contain alcohol.?	Energy drinks and sports drinks.?	Bubbly (carbonated) drinks or sodas.?	Chocolate and cocoa.?	Peppermint and mint flavorings.?	Garlic and onions.?	Horseradish.?	Spicy and acidic foods. These include peppers, chili powder, curry powder, vinegar, hot sauces, and BBQ sauce.?	Citrus fruit juices and citrus fruits, such as oranges, lemons, and limes.?	Tomato-based foods. These include red sauce, chili, salsa, and pizza with red sauce.?	Fried and fatty foods. These include donuts, french fries, potato chips, and high-fat dressings.?	High-fat meats. These include hot dogs, rib eye steak, sausage, ham, and bacon.?	High-fat dairy items, such as whole milk, butter, and cream cheese.?	Eat small meals often. Avoid eating large meals.?	Avoid drinking large amounts of liquid with your meals.?	Avoid eating meals during the 2-3 hours before bedtime.?	Avoid lying down right after you eat.?	Do not exercise right after you eat.  Lifestyle?	Do not smoke or use any products that contain nicotine or tobacco. If you need help quitting, ask your doctor.?	Try to lower your stress. If you need help doing this, ask your doctor.?	If you are overweight, lose an amount of weight that is healthy for you. Ask your doctor about a safe weight loss goal.  General instructions?	Pay attention to any changes in your symptoms.?	Take over-the-counter and prescription medicines only as told by your doctor.?	Do not take aspirin, ibuprofen, or other NSAIDs unless your doctor says it is okay.?	Wear loose clothes. Do not wear anything tight around your waist.?	Raise (elevate) the head of your bed about 6 inches (15 cm). You may need to use a wedge to do this.?	Avoid bending over  if this makes your symptoms worse.?	Keep all follow-up visits.Contact a doctor if:?	You have new symptoms.?	You lose weight and you do not know why.?	You have trouble swallowing or it hurts to swallow.?	You have wheezing or a cough that keeps happening.?	You have a hoarse voice.?	Your symptoms do not get better with treatment.Get help right away if:?	You have sudden pain in your arms, neck, jaw, teeth, or back.?	You suddenly feel sweaty, dizzy, or light-headed.?	You have chest pain or shortness of breath.?	You vomit and the vomit is green, yellow, or black, or it looks like blood or coffee grounds.?	You faint.?	Your poop (stool) is red, bloody, or black.?	You cannot swallow, drink, or eat.These symptoms may represent a serious problem that is an emergency. Do not wait to see if the symptoms will go away. Get medical help right away. Call your local emergency services (911 in the [place] Do not drive yourself to the hospital.Summary?	If a person has gastroesophageal reflux disease (GERD), food and stomach acid move back up into the esophagus and cause symptoms or problems such as damage to the esophagus.?	Treatment will depend on how bad your symptoms are.?	Follow a diet as told by your doctor.?	Take all medicines only as told by your doctor.This information is not intended to replace advice given to you by your health care provider. Make sure you discuss any questions you have with your health care provider.Document Revised: 11/05/2019 Document Reviewed: 06/28/2021Elsevier Patient Education ? 2021 Else[employer]

## 2020-07-04 NOTE — ED Provider Notes
HistoryChief Complaint Patient presents with ? Palpitations   Called EMS for panick attack with hx of and palpitation. Sinus arrhythmia (80-150 BPM) on monitor. Denies CP/SOB/fever. AAOx4. Ambulatory.   The history is provided by the patient. No language interpreter was used. PalpitationsPalpitations quality:  Fast (fluttering)Duration: seconds.Timing:  ConstantProgression:  UnchangedChronicity:  NewRelieved by:  NothingWorsened by:  NothingIneffective treatments:  None triedAssociated symptoms: no back pain, no chest pain, no nausea, no shortness of breath and no vomiting   Past Medical History: Diagnosis Date ? Chronic kidney disease   PKD, but normal creatinine ? Hypertension  ? Palpitations  ? Polycystic kidney disease  ? Sickle cell trait (HC Code) (HC CODE)   Natera testing 2021 ? Vitamin D deficiency  Past Surgical History: Procedure Laterality Date ? APPENDECTOMY  1984 ? DILATION AND CURETTAGE OF UTERUS   ? HYSTERECTOMY  08/30/2017 ? LAPAROSCOPY   ? LEEP   ? TONSILLECTOMY   ? TUBAL LIGATION   Family History Problem Relation Age of Onset ? High cholesterol Mother  ? Hypertension Mother  ? Stroke Mother  ? Diabetes Father  ? Kidney disease Father  ? Hyperthyroidism Sister  ? Diabetes Brother  ? Hypertension Brother  ? Diabetes Brother  ? Breast cancer Maternal Grandmother  ? Asthma Son  ? Ovarian cancer Maternal Aunt  ? Heart attack Paternal Uncle  Social History Socioeconomic History ? Marital status: Single   Spouse name: Not on file ? Number of children: Not on file ? Years of education: Not on file ? Highest education level: Not on file Tobacco Use ? Smoking status: Passive Smoke Exposure - Never Smoker ? Smokeless tobacco: Never Used Vaping Use ? Vaping Use: Never used Substance and Sexual Activity ? Alcohol use: Not Currently   Comment: social ? Drug use: No ? Sexual activity: Yes   Partners: Male ED Other Social History ? E-cigarette status Never User  ? E-Cigarette Use Never User  E-cigarette/Vaping Substances ? Nicotine No  ? THC No  ? CBD No  ? Flavoring No  E-cigarette/Vaping Devices ? Disposable No  ? Pre-filled or Refillable Cartridge No  ? Refillable Tank No  ? Pre-filled Pod No  Review of Systems Constitutional: Negative for chills and fever. Respiratory: Negative for chest tightness and shortness of breath.  Cardiovascular: Positive for palpitations. Negative for chest pain. Gastrointestinal: Negative for constipation, diarrhea, nausea and vomiting. Genitourinary: Negative for difficulty urinating and dysuria. Musculoskeletal: Negative for arthralgias and back pain. Skin: Negative for rash. All other systems reviewed and are negative. Physical ExamED Triage Vitals [06/27/20 1534]BP: 129/82Pulse: (!) 96Pulse from  O2 sat: n/aResp: 16Temp: 97.7 ?F (36.5 ?C)Temp src: OralSpO2: 100 % BP 113/66  - Pulse 90  - Temp 97 ?F (36.1 ?C) (Oral)  - Resp 18  - LMP 08/30/2017 (Exact Date) Comment: hyst 08/30/2017 - SpO2 100% Physical ExamVitals and nursing note reviewed. Constitutional:     Appearance: Normal appearance. HENT:    Head: Normocephalic and atraumatic. Eyes:    General:       Right eye: No discharge.       Left eye: No discharge. Cardiovascular:    Rate and Rhythm: Normal rate and regular rhythm. Pulmonary:    Effort: Pulmonary effort is normal.    Breath sounds: Normal breath sounds. Abdominal:    General: Abdomen is flat. There is no distension.    Tenderness: There is no abdominal tenderness. Skin:   General: Skin is warm and dry. Neurological:    General: No focal  deficit present.    Mental Status: She is alert and oriented to person, place, and time. Psychiatric:       Mood and Affect: Mood normal. ProceduresProcedures  Assessment & Plan This is a 55 year old female with history of anxiety and chronic palpitations who comes to emergency room for evaluation of palpitations onset earlier today.  She reports she is feeling anxious home, her daughter is moving in with daughters 3 children.  She reports fast and fluttering palpitations in her chest prior to arrival to the ED.  she has been seen by Cardiology for this in the past though she has refused for the past 1.5 years to wear hernia monitoring device that she has a home.On exam heart is regular, heart rate 80s and 90s.  She does appear anxious.Differential diagnosis:  Arrhythmia, metabolic derangement, doubt ACS or PE.Will place on cardiac monitor, check electrolytes, monitor closely.Labs reassuring.  Heart rate 80s.  She is safe for discharge home, encouraged to wear her monitoring device and follow-up with cardiology.  Return precautions were discussed.  She will follow up with primary care physician to discuss her anxiety.Discussed with Dr Ave Filter who independently evaluated ptED COURSEInterpreted by ED Provider: labs and ECGPatient Reevaluation: ED Attestation: PA/APRNFace to face evaluation was performed by me in collaboration with the Advanced Practice Provider to assess for significant health threats. I provided a substantive portion of the care of this patient.? I personally performed the History, Exam and MDM54 y/o F here with anxiety, palpitations. No CP.  On my exam: afebrile, HD stable, well-appearingMy differential includes: anxiety, panic attack, less likely arrhythmia, metabolic derangement, thyroid dysfunction. Plan: labs, EKG, likely d/cIris ChandlerClinical Impressions as of Jul 04 120 Palpitations  ED DispositionDischarge Shayne Alken, PA02/18/22 1833 Charlann Boxer, MD02/25/22 [phone]

## 2020-07-05 ENCOUNTER — Encounter: Admit: 2020-07-05 | Payer: PRIVATE HEALTH INSURANCE | Attending: Nephrology | Primary: Internal Medicine

## 2020-07-06 ENCOUNTER — Encounter: Admit: 2020-07-06 | Payer: PRIVATE HEALTH INSURANCE | Attending: Cardiovascular Disease | Primary: Internal Medicine

## 2020-07-06 DIAGNOSIS — I10 Essential (primary) hypertension: Secondary | ICD-10-CM

## 2020-07-07 MED ORDER — LISINOPRIL 5 MG TABLET
5 mg | ORAL_TABLET | 3 refills | Status: AC
Start: 2020-07-07 — End: 2020-12-12

## 2020-07-10 ENCOUNTER — Ambulatory Visit: Admit: 2020-07-10 | Payer: PRIVATE HEALTH INSURANCE | Primary: Internal Medicine

## 2020-07-11 ENCOUNTER — Inpatient Hospital Stay: Admit: 2020-07-11 | Discharge: 2020-07-11 | Payer: PRIVATE HEALTH INSURANCE | Primary: Internal Medicine

## 2020-07-11 DIAGNOSIS — R0602 Shortness of breath: Secondary | ICD-10-CM

## 2020-07-11 DIAGNOSIS — I341 Nonrheumatic mitral (valve) prolapse: Secondary | ICD-10-CM

## 2020-07-11 DIAGNOSIS — R002 Palpitations: Secondary | ICD-10-CM

## 2020-07-11 DIAGNOSIS — I10 Essential (primary) hypertension: Secondary | ICD-10-CM

## 2020-07-11 NOTE — Other
I called Cathy Evans to discuss her echocardiogram.  Preserved LVEF.  She has a history of mitral valve prolapse.  She has normal mitral valve leaflets on this echocardiogram, but does have moderate mitral regurgitation by EROA (0.24; moderate range 0.2-0.39).  Normal sized atria.  Her blood pressure and heart rate have been well controlled, and she was euvolemic last visit.  No chest pain/pressure or shortness of breath.  Will continue to monitor.She has yet to complete/submit the cardiac event monitor (7 days).She will see Dr. Margarito Courser in August.-- Called, no response, left VM to call back.

## 2020-07-11 NOTE — Other
Interatrial septum bowed toward the left, c/w elevated right atrial pressures.  Could consider lowering ACEI and attempting thiazide diuretic in future (but based on exam no hypervolemia).

## 2020-07-14 ENCOUNTER — Telehealth: Admit: 2020-07-14 | Payer: PRIVATE HEALTH INSURANCE | Attending: Cardiovascular Disease | Primary: Internal Medicine

## 2020-07-14 NOTE — Telephone Encounter
HOLTER & ECHO RESULTS

## 2020-07-14 NOTE — Telephone Encounter
I called Cathy Evans regarding her echocardiogram results.  She has moderate mitral regurgitation (not present on echo in 2002).  I explained we can continue to monitor.  Awaiting cardiac event monitor results.  She had a normal stress test 2010.  Right now no reports of chest pain/pressure.

## 2020-07-14 NOTE — Telephone Encounter
Pt called to inquire about echo and holter monitor results.  Please advise.  Message routed to Patrecia Pace, APRN.

## 2020-07-16 ENCOUNTER — Encounter: Admit: 2020-07-16 | Payer: PRIVATE HEALTH INSURANCE | Primary: Internal Medicine

## 2020-07-16 DIAGNOSIS — R0602 Shortness of breath: Secondary | ICD-10-CM

## 2020-07-16 DIAGNOSIS — R42 Dizziness and giddiness: Secondary | ICD-10-CM

## 2020-07-16 DIAGNOSIS — I4719 Atrial tachycardia: Secondary | ICD-10-CM

## 2020-07-16 DIAGNOSIS — R002 Palpitations: Secondary | ICD-10-CM

## 2020-07-16 MED ORDER — METOPROLOL SUCCINATE ER 25 MG TABLET,EXTENDED RELEASE 24 HR
25 mg | ORAL_TABLET | Freq: Every day | ORAL | 4 refills | Status: AC
Start: 2020-07-16 — End: 2021-11-12

## 2020-07-16 MED ORDER — METOPROLOL SUCCINATE ER 25 MG TABLET,EXTENDED RELEASE 24 HR
25 mg | ORAL_TABLET | Freq: Every day | ORAL | 12 refills | Status: AC
Start: 2020-07-16 — End: 2020-07-16

## 2020-07-16 NOTE — Progress Notes
[facility] CenterGeneral CardiologyProgress Note [facility] [place]) OfficeHistory of Present Illness: Cathy Evans is a 55 y.o. female with PMHx of HTN, palpitations on atenolol (cardiac event monitor ordered 07/26/2018 however patient did not obtain), CKD, anxiety, PKD (but normal Cr), Sickle Cell Trait.  Last office visit 10/12/2018.Cathy Evans presents to my office for hospital follow up.  She sought care in ED 06/27/20 for a panic attack with fluttering in her chest.  Sinus arrhythmia 50-150 beats per minute on monitor (150 beats per minute not captured).  ECG revealed NSR rate 86 bpm, remained in primarily 80-90's while in the ED.  High-sensitivity troponin negative.  Discharged home to follow up with cardiology outpatient.  Palptiations have been ongoing for last 2 years occurring a few times per day day worsening with eating, activity (strenuous activity or cleaning) and at night associated with burping and anxiety.  Pt wakes up burping, and then feels a fluttering in her chest.  During strenuous work her heart beats much faster and is associated with shortness of breath. She has no formal exercise.   Pt says in the ED Ativan gave her relief from her palpitations, and says she believes her anxiety and GI issues may contribute. Pt stopped the atenolol, because she felt it made no difference, and admits she doesn't like to take medications in general.Occasionally she feels chest pain, but it is not related to activity and feels more like a muscle soreness w/ tenderness on palpation.Pt has been seen by GI for her burping and possible reflux for which she had an endoscopy/colonoscopy - pt was told there was something about her esophagus, but she cannot recall the issue.  She has an intermittent epigastric dull ache in her upper abdomen (without radiation, nausea, vomiting or diarrhea).  She has Pepcid but has not started taking it (tried pantoprazole which made no difference).She also reports some lightheadedness/dizziness and unsteadiness on her feet.  She drinks 1 cup of water a day.Pt reports having a stress test in [place] years ago.  Pt says she also had a cardiac catheterization (?) and that it was normal.Pt reports a multitude of general symptoms including Intermittent fatigue, daytime sleepiness, weight loss (lost 4-6 lbs at least, intentional), intermittent sinus pressure, fluid out of her ears for years ,general ENT chronic congestion, epistaxis and easy bleeding, rash under her breasts, back pain, abdominal bloating, intermittent constipation, dysphagia, possible heartburn, environmental allergies.Primary Care Physician: Damien Fusi MDCardiologist: Dr. Margarito Courser Nephrology: Dr. Celedonio Savage 7/29/22Medical History: Patient's medical/surgical/social/family history, medications, allergies, and problem list are reviewed with the patient at this encounter.  Past Medical History: Diagnosis Date ? Chronic kidney disease   PKD, but normal creatinine ? Hypertension  ? Palpitations  ? Polycystic kidney disease  ? Sickle cell trait (HC Code) (HC CODE)   Natera testing 2021 ? Vitamin D deficiency  Past Surgical History: Procedure Laterality Date ? APPENDECTOMY  1984 ? DILATION AND CURETTAGE OF UTERUS   ? HYSTERECTOMY  08/30/2017 ? LAPAROSCOPY   ? LEEP   ? TONSILLECTOMY   ? TUBAL LIGATION   Medications: Current Outpatient Medications Medication Sig Dispense Refill ? ascorbic acid (VITAMIN C ORAL) Take 500 mg by mouth.    ? cyanocobalamin (VITAMIN B-12) 100 MCG tablet Take 100 mcg by mouth daily.   ? ELDERBERRY FRUIT ORAL Take by mouth.   ? ergocalciferol, vitamin D2, (VITAMIN D ORAL) Take 1,000 Units by mouth.    ? hydrOXYchloroQUINE (PLAQUENIL) 200 mg tablet TAKE 1 TABLET(200  MG) BY MOUTH DAILY 180 tablet 0 ? Lactobacillus acidophilus (PROBIOTIC ORAL) Take by mouth.   ? lisinopriL (PRINIVIL,ZESTRIL) 5 mg tablet Take 1 tablet (5 mg total) by mouth daily. 90 tablet 2 ? multivitamin capsule Take 1 capsule by mouth daily.     ? zinc sulfate (ZINC-15 ORAL) Take by mouth.   ? lidocaine (LIDODERM) 5 % Place 1 patch onto the skin daily. Remove & Discard patch within 12 hours or as directed by MD 30 patch 11 ? magnesium L-lactate (MAGTAB) 84 mg SR tablet Take 84 mg by mouth daily. (Patient not taking: Reported on 07/03/2020)   No current facility-administered medications for this visit. The outpatient medications have been reviewed by Lamonte Richer, APRN 06/30/2020.Allergies Allergen Reactions ? Penicillins Hives   Hives Has patient had a PCN reaction causing immediate rash, facial/tongue/throat swelling, SOB or lightheadedness with hypotension: YESHas patient had a PCN reaction causing severe rash involving mucus membranes or skin necrosis: NOHas patient had a PCN reaction that required hospitalization NOHas patient had a PCN reaction occurring within the last 10 years: NOIf all of the above answers are NO, then may proceed with Cephalosporin use. ? Sulfa (Sulfonamide Antibiotics) Hives and Rash ? Sulfasalazine    rash Review of Systems: Review of Systems Constitutional: Positive for malaise/fatigue (energy is up and down), night sweats (occasional) and weight loss (lost a few lbs). Negative for fever. HENT: Positive for congestion (intermittent sinus pressure). Negative for ear pain and sore throat.       She reports intermittent fluid out of her ears, and general ENT congestion (chronic) Eyes: Negative for blurred vision. Cardiovascular: Positive for chest pain (associated with muscle tenderness), irregular heartbeat and palpitations (worse after eating or at night). Negative for leg swelling, near-syncope, orthopnea, paroxysmal nocturnal dyspnea and syncope. Respiratory: Positive for shortness of breath. Negative for cough and wheezing.       She reports a chronic dry throat Hematologic/Lymphatic: Bruises/bleeds easily (intermittent epistaxis). Skin: Positive for rash (under her breasts, itching). Musculoskeletal: Positive for back pain.      Muscle spasms Gastrointestinal: Positive for bloating, abdominal pain (upper abdominal soreness), constipation (milk of magnesia helps), dysphagia (occasionally) and heartburn. Negative for diarrhea, nausea and vomiting.      Burping Genitourinary: Negative for hematuria. Neurological: Positive for excessive daytime sleepiness, dizziness, light-headedness, loss of balance and weakness. Negative for tremors.      Feels off balance at times Psychiatric/Behavioral: Negative for depression. The patient is nervous/anxious.  Allergic/Immunologic: Positive for environmental allergies (she reports sensitivity to detergent). Remaining 10-system review of systems is otherwise normal or non-contributory.  See HPI.Vital Signs and Exam: BP 118/80 (Site: r a, Position: Sitting, Cuff Size: Medium)  - Pulse 78  - Temp 97.7 ?F (36.5 ?C)  - Ht 5' 5 (1.651 m)  - Wt 71.7 kg  - LMP 08/30/2017 (Exact Date) Comment: hyst 08/30/2017 - BMI 26.29 kg/m? BP Readings from Last 3 Encounters: 06/27/20 113/66 10/19/19 (!) 142/80 09/14/19 124/84  Pulse Readings from Last 3 Encounters: 06/27/20 90 09/14/19 82 07/04/18 76  Wt Readings from Last 3 Encounters: 06/03/20 74.8 kg 10/19/19 77.6 kg 09/14/19 77.7 kg GEN: Pleasant, alert, in no apparent distress.HEENT: Anicteric. NECK:  No JVD visualized, no carotid bruit.CV: Regular rate and rhythm.  Clear S1 and S2 without murmurs, rubs, or gallops.LUNG: Lungs are clear to auscultation bilaterally.  No wheezes/rales/rhonchi. ABD: Abdomen soft, non-tender, non-distended upon palpation.EXTREM: No lower extremity edema, 1+ DP & PT pulses bilaterally. Warm distal extremities.NEURO: Mood  and affect appropriate.  A/o x 4.Labs and Tests: Lipid Panel Lab Results Component Value Date  CHOL 177 03/13/2018  HDL 48 03/13/2018  LDL 116 (H) 03/13/2018  TRIG 67 03/13/2018 Chemistry Lab Results Component Value Date  NA 141 06/27/2020  K 3.7 06/27/2020  CL 104 06/27/2020  CO2 22 06/27/2020  BUN 7 06/27/2020  CREATININE 0.50 06/27/2020  PHOS 3.5 06/27/2020  CALCIUM 10.2 06/27/2020 Lab Results Component Value Date  ALT 15 01/18/2020  AST 20 01/18/2020  ALKPHOS 65 01/18/2020  BILITOT 0.3 01/18/2020 Echocardiogram: Reviewed1/8/2002VALVE:Intact mitral valve without regurgitation.Normal aortic valve opening without insufficiency.Normal tricuspid valve with trace regurgitationand anestimated systolic pulmonary artery pressure of 27mm HG.Normal pulmonic valve without insufficiency.CHAMBERS:Normal left ventricular size and motion withouthypertrophy.Normal left atrial size.The right-sided chambers are normal in size.There is amobile intra-atrial septum. The contrast studyperformedwith agitated saline demonstrated intra atrialshunting.AORTA: Normal.PERICARDIUM: There is no evidence of a pericardialeffusion.IMPRESSION:Normal left ventricular size and systolic functionTransthoracic Echo -- cont'dwithouthypertrophy. An estimated ejection fraction is60%.No significant valvular abnormalities areidentified.Patent foramen ovale.Cardiac Stress test: 5/25/2010ReviewedSummary of Exercise Stress Test Results: A Brucetreadmill stress protocol was used and the patientwas able to exercise for a total of 10:21 minachieving a peak HR of 179 which equals thecalculated maximal predicted rate. 0.5 mmdownsloping ST depression V4-V6. Stress ECGnegative for ischemia.IMPRESSION:The left ventricle is normal in size.No evidence of myocardial ischemia or infarction.The gated SPECT post exercise images demonstratenormal left ventricular wall thickening. Theejection fraction is >70% on both post stress andrest imaging.Normal right ventricular size and motion.Electrocardiogram: 06/27/2020 NSR rate 86 bpm (no significant change from prior electrocardiogram tracings).Assessment and Plan: Cathy Evans is a 55 y.o. female with history of palpitations and hypertension who presents to my office for a focused visit regarding palpitations.Palpitations - Increasing in frequency with associated burping and anxiety, but worse at night or with activity.  Labs in ED stable.A) Placing 7 day cardiac event monitorB) She does not wish to resume atenolol 25 mg QD as she felt it didn't help, thus will evaluate monitor.C) Transthoracic echocardiogram to evaluate structure/function (Hx MVP, but none visualized on echo in early 2000's).D) Increase hydrationE) Stress relief activitiesF) Begin some form of physical exerciseAddendum: NSR-ST, rare APC's, 1 run of atrial tachycardia 190's.  Starting metoprolol XL 25 mg QD.  Pt did not wish to resume her HTN - Well controlled.A) Continue lisinopril 5 mg once dailyPt Care - A) Reviewed signs/symptoms of reflux, words to avoid, OK to use OTC Pepcid and follow up with PCP or GI.It was a pleasure to participate in the care of this patient.Patient will return to my office in 2 months, but is instructed to call me if any issues arise between now and then.On the day of this patient?s encounter, a total of 30 minutes was personally spent by me. This does not include any resident/fellow teaching time, or any time spent performing a procedural service.Patrecia Pace, DNP, APRNYale Heart and Vascular Center[address] [facility] [place] [address]UEAVW: [phone]: [phone] Signed by Lamonte Richer, APRN, June 30, 2020

## 2020-07-16 NOTE — Other
I called Cathy Evans regarding cardiac event results.  She is no longer taking her atenolol.  Plan - 1) She does not wish to resume atenolol (pt felt it didn't work).  Will try metoprolol XL 25 mg QD nightly.Sinus rhythm to sinus tachycardia. Heart rate range of 53-158 bpm, with an average heart rate of 79 bpm.Occasional APCs and rare VPCs.Symptoms of flutter/skipped beats with APCs and atrial tachycardia run (to a heart rate of 196 bpm).

## 2020-07-21 ENCOUNTER — Encounter: Admit: 2020-07-21 | Payer: PRIVATE HEALTH INSURANCE | Attending: Nephrology | Primary: Internal Medicine

## 2020-07-22 ENCOUNTER — Encounter: Admit: 2020-07-22 | Payer: PRIVATE HEALTH INSURANCE | Attending: Ophthalmology | Primary: Internal Medicine

## 2020-07-25 ENCOUNTER — Encounter: Admit: 2020-07-25 | Payer: PRIVATE HEALTH INSURANCE | Attending: Nephrology | Primary: Internal Medicine

## 2020-08-01 ENCOUNTER — Encounter: Admit: 2020-08-01 | Payer: PRIVATE HEALTH INSURANCE | Attending: Family | Primary: Internal Medicine

## 2020-08-01 DIAGNOSIS — Z01818 Encounter for other preprocedural examination: Secondary | ICD-10-CM

## 2020-08-05 ENCOUNTER — Encounter: Admit: 2020-08-05 | Payer: PRIVATE HEALTH INSURANCE | Primary: Internal Medicine

## 2020-08-05 DIAGNOSIS — E559 Vitamin D deficiency, unspecified: Secondary | ICD-10-CM

## 2020-08-05 DIAGNOSIS — K219 Gastro-esophageal reflux disease without esophagitis: Secondary | ICD-10-CM

## 2020-08-05 DIAGNOSIS — Q613 Polycystic kidney, unspecified: Secondary | ICD-10-CM

## 2020-08-05 DIAGNOSIS — N189 Chronic kidney disease, unspecified: Secondary | ICD-10-CM

## 2020-08-05 DIAGNOSIS — I10 Essential (primary) hypertension: Secondary | ICD-10-CM

## 2020-08-05 DIAGNOSIS — R002 Palpitations: Secondary | ICD-10-CM

## 2020-08-05 DIAGNOSIS — D573 Sickle-cell trait: Secondary | ICD-10-CM

## 2020-08-10 ENCOUNTER — Ambulatory Visit: Admit: 2020-08-10 | Payer: PRIVATE HEALTH INSURANCE | Primary: Internal Medicine

## 2020-08-12 ENCOUNTER — Encounter: Admit: 2020-08-12 | Payer: PRIVATE HEALTH INSURANCE | Primary: Internal Medicine

## 2020-08-12 ENCOUNTER — Encounter: Admit: 2020-08-12 | Payer: PRIVATE HEALTH INSURANCE | Attending: Adult Health | Primary: Internal Medicine

## 2020-08-12 DIAGNOSIS — Z01818 Encounter for other preprocedural examination: Secondary | ICD-10-CM

## 2020-08-13 ENCOUNTER — Encounter: Admit: 2020-08-13 | Payer: PRIVATE HEALTH INSURANCE | Primary: Internal Medicine

## 2020-08-13 NOTE — Discharge Instructions
Patient Education EsophagogastroduodenoscopyEsophagogastroduodenoscopy (EGD) is a procedure to examine the lining of the esophagus, stomach, and first part of the small intestine (duodenum). This procedure is done to check for problems such as inflammation, bleeding, ulcers, or growths.During this procedure, a long, flexible, lighted tube with a camera attached (endoscope) is inserted down the throat.Tell a health care provider about:?	Any allergies you have.?	All medicines you are taking, including vitamins, herbs, eye drops, creams, and over-the-counter medicines.?	Any problems you or family members have had with anesthetic medicines.?	Any blood disorders you have.?	Any surgeries you have had.?	Any medical conditions you have.?	Whether you are pregnant or may be pregnant.What are the risks?Generally, this is a safe procedure. However, problems may occur, including:?	Infection.?	Bleeding.?	A tear (perforation) in the esophagus, stomach, or duodenum.?	Trouble breathing.?	Excessive sweating.?	Spasms of the larynx.?	A slowed heartbeat.?	Low blood pressure.What happens before the procedure??	Follow instructions from your health care provider about eating or drinking restrictions.?	Ask your health care provider about:?	Changing or stopping your regular medicines. This is especially important if you are taking diabetes medicines or blood thinners.?	Taking medicines such as aspirin and ibuprofen. These medicines can thin your blood. Do not take these medicines before your procedure if your health care provider instructs you not to.?	Plan to have someone take you home after the procedure.?	If you wear dentures, be ready to remove them before the procedure.What happens during the procedure??	To reduce your risk of infection, your health care team will wash or sanitize their hands.?	An IV tube will be put in a vein in your hand or arm. You will get medicines and fluids through this tube.?	You will be given one or more of the following:?	A medicine to help you relax (sedative).?	A medicine to numb the area (local anesthetic). This medicine may be sprayed into your throat. It will make you feel more comfortable and keep you from gagging or coughing during the procedure.?	A medicine for pain.?	A mouth guard may be placed in your mouth to protect your teeth and to keep you from biting on the endoscope.?	You will be asked to lie on your left side.?	The endoscope will be lowered down your throat into your esophagus, stomach, and duodenum.?	Air will be put into the endoscope. This will help your health care provider see better.?	The lining of your esophagus, stomach, and duodenum will be examined.?	Your health care provider may:?	Take a tissue sample so it can be looked at in a lab (biopsy).?	Remove growths.?	Remove objects (foreign bodies) that are stuck.?	Treat any bleeding with medicines or other devices that stop tissue from bleeding.?	Widen (dilate) or stretch narrowed areas of your esophagus and stomach.?	The endoscope will be taken out.The procedure may vary among health care providers and hospitals.What happens after the procedure??	Your blood pressure, heart rate, breathing rate, and blood oxygen level will be monitored often until the medicines you were given have worn off.?	Do not eat or drink anything until the numbing medicine has worn off and your gag reflex has returned.This information is not intended to replace advice given to you by your health care provider. Make sure you discuss any questions you have with your health care provider.Document Released: 08/27/2004 Document Revised: 10/02/2015 Document Reviewed: 11/10/2016Elsevier Interactive Patient Education ? 2019 [employer]

## 2020-08-15 ENCOUNTER — Telehealth: Admit: 2020-08-15 | Payer: PRIVATE HEALTH INSURANCE | Attending: Nephrology | Primary: Internal Medicine

## 2020-08-15 NOTE — Telephone Encounter
Meds ordered by her PCP.

## 2020-08-15 NOTE — Telephone Encounter
Cathy Evans states that she was recently tested positive for Covid and was told to ask her MD about prescribing an antiviral medication for her to take. Advised that she speak to her PMD regarding this issue. She states that the she has tried and the PMD office has not called her back yet. She wants a message given to Dr. Celedonio Savage regarding her request. Message sent as requested. She was advised to try calling her PMD again.

## 2020-09-01 ENCOUNTER — Encounter: Admit: 2020-09-01 | Payer: PRIVATE HEALTH INSURANCE | Attending: Cardiovascular Disease | Primary: Internal Medicine

## 2020-09-18 ENCOUNTER — Inpatient Hospital Stay: Admit: 2020-09-18 | Discharge: 2020-09-18 | Payer: PRIVATE HEALTH INSURANCE | Primary: Internal Medicine

## 2020-09-18 ENCOUNTER — Emergency Department: Admit: 2020-09-18 | Payer: PRIVATE HEALTH INSURANCE | Primary: Internal Medicine

## 2020-09-18 ENCOUNTER — Encounter: Admit: 2020-09-18 | Payer: PRIVATE HEALTH INSURANCE | Primary: Internal Medicine

## 2020-09-18 DIAGNOSIS — N189 Chronic kidney disease, unspecified: Secondary | ICD-10-CM

## 2020-09-18 DIAGNOSIS — I340 Nonrheumatic mitral (valve) insufficiency: Secondary | ICD-10-CM

## 2020-09-18 DIAGNOSIS — I129 Hypertensive chronic kidney disease with stage 1 through stage 4 chronic kidney disease, or unspecified chronic kidney disease: Secondary | ICD-10-CM

## 2020-09-18 DIAGNOSIS — I10 Essential (primary) hypertension: Secondary | ICD-10-CM

## 2020-09-18 DIAGNOSIS — K219 Gastro-esophageal reflux disease without esophagitis: Secondary | ICD-10-CM

## 2020-09-18 DIAGNOSIS — R002 Palpitations: Secondary | ICD-10-CM

## 2020-09-18 DIAGNOSIS — S39012A Strain of muscle, fascia and tendon of lower back, initial encounter: Secondary | ICD-10-CM

## 2020-09-18 DIAGNOSIS — D573 Sickle-cell trait: Secondary | ICD-10-CM

## 2020-09-18 DIAGNOSIS — Z79899 Other long term (current) drug therapy: Secondary | ICD-10-CM

## 2020-09-18 DIAGNOSIS — K200 Eosinophilic esophagitis: Secondary | ICD-10-CM

## 2020-09-18 DIAGNOSIS — R102 Pelvic and perineal pain: Secondary | ICD-10-CM

## 2020-09-18 DIAGNOSIS — M545 Low back pain, unspecified: Principal | ICD-10-CM

## 2020-09-18 DIAGNOSIS — M5441 Lumbago with sciatica, right side: Secondary | ICD-10-CM

## 2020-09-18 DIAGNOSIS — M5442 Lumbago with sciatica, left side: Secondary | ICD-10-CM

## 2020-09-18 DIAGNOSIS — Q613 Polycystic kidney, unspecified: Secondary | ICD-10-CM

## 2020-09-18 DIAGNOSIS — E559 Vitamin D deficiency, unspecified: Secondary | ICD-10-CM

## 2020-09-18 DIAGNOSIS — Z882 Allergy status to sulfonamides status: Secondary | ICD-10-CM

## 2020-09-18 DIAGNOSIS — Z880 Allergy status to penicillin: Secondary | ICD-10-CM

## 2020-09-18 DIAGNOSIS — Z7722 Contact with and (suspected) exposure to environmental tobacco smoke (acute) (chronic): Secondary | ICD-10-CM

## 2020-09-18 MED ORDER — METHYLPREDNISOLONE 4 MG TABLETS IN A DOSE PACK
4 mg | ORAL_TABLET | 1 refills | Status: AC
Start: 2020-09-18 — End: ?

## 2020-09-18 MED ORDER — CYCLOBENZAPRINE 10 MG TABLET
10 mg | ORAL_TABLET | Freq: Two times a day (BID) | ORAL | 1 refills | Status: AC | PRN
Start: 2020-09-18 — End: 2020-12-12

## 2020-09-18 NOTE — Discharge Instructions
REST FROM STRENUOUS ACTIVITYSTART HEAT TREATMENT THREE TIMES DAILY FOR THE NEXT 5-7 DAYSTAKE PREDNISONE ON A FULL STOMACHYOU MAKE TAKE TYLENOL AS NEEDED FOR PAINDO NOT TAKE FLEXERIL IF DRIVING OR OPERATING MACHINERY, DO NOT COMBINE WITH ALCOHOL OR ANY SEDATING MEDICATION. DO NOT TAKE DURING YOUR WORKING HOURSFOLLOW UP WITH ORTHOPEDISTS/PMD FOR CONTINUITY OF CARE WITHIN 1-5 DAYS, SOONER IF WORSEGO TO THE EMERGENCY ROOM IF ANY WORSENING SYMPTOMS

## 2020-09-18 NOTE — Other
Telephone call to patient informing her of her official x-ray report showing mild  lower lumbar degenerative changes.  I also advised her that x-ray report showed a moderate amount of stool in her colon.  I asked patient if she suffers from constipation as she says yes.  I advised her to increase water and fiber in diet and taken over-the-counter laxative.  Patient expressed understanding and said she will

## 2020-09-18 NOTE — ED Notes
Pt verbalized understanding of discharge instructions and recommended follow up care as per the after visit summary.  Written discharge instructions provided. Denies any further questions. Vital signs  Vital SignsTemp: 98.2 ?F (36.8 ?C)Temp src: TemporalPulse: 76Resp: 16BP: 125/79NIBP MAP (mmHg): 94E. Felizardo Hoffmann, RN

## 2020-09-19 ENCOUNTER — Encounter: Admit: 2020-09-19 | Payer: PRIVATE HEALTH INSURANCE | Attending: Dermatology | Primary: Internal Medicine

## 2020-09-19 NOTE — ED Provider Notes
HistoryChief Complaint Patient presents with ? Back Pain   lower back pain x 5 days ( initially hurt back in Feb from lifting vacuum up stairs)  55 y.o. female patient presents to [facility] with complaints of sever lower back pain that came out no where 5 days ago with associated pelvic pressure/pain that worsens when sitting . Pt denies any numbness, fever, chills, tingling, or problems urinating. She has taken motrin and lidocaine patches, which offered no relief. She knows of no known injury to the area. She tried treating pain with heat and epsom salt baths, which offered no relief. She states that pain worsens with movement and touch. PMHx of connective tissue disorder and back pain in February of 2022.  Past Medical History: Diagnosis Date ? Chronic kidney disease   PKD, but normal creatinine ? Eosinophilic esophagitis 08/26/2020 ? GERD (gastroesophageal reflux disease)  ? Hypertension  ? Mitral valve regurgitation  ? Palpitations  ? Polycystic kidney disease  ? Sickle cell trait (HC Code) (HC CODE) (HC Code)   Natera testing 2021 ? Vitamin D deficiency  Past Surgical History: Procedure Laterality Date ? APPENDECTOMY  1984 ? COLONOSCOPY   ? DILATION AND CURETTAGE OF UTERUS   ? HYSTERECTOMY  08/30/2017 ? LAPAROSCOPY   ? LEEP   ? TONSILLECTOMY   ? TUBAL LIGATION   ? UPPER GASTROINTESTINAL ENDOSCOPY   Family History Problem Relation Age of Onset ? High cholesterol Mother  ? Hypertension Mother  ? Stroke Mother  ? Diabetes Father  ? Kidney disease Father  ? Hyperthyroidism Sister  ? Diabetes Brother  ? Hypertension Brother  ? Diabetes Brother  ? Breast cancer Maternal Grandmother  ? Asthma Son  ? Ovarian cancer Maternal Aunt  ? Heart attack Paternal Uncle  Social History Socioeconomic History ? Marital status: Single   Spouse name: Not on file ? Number of children: Not on file ? Years of education: Not on file ? Highest education level: Not on file Tobacco Use ? Smoking status: Passive Smoke Exposure - Never Smoker ? Smokeless tobacco: Never Used Vaping Use ? Vaping Use: Never used Substance and Sexual Activity ? Alcohol use: Not Currently   Comment: social ? Drug use: No ? Sexual activity: Yes   Partners: Male ED Other Social History ? E-cigarette status Never User  ? E-Cigarette Use Never User  E-cigarette/Vaping Substances ? Nicotine No  ? THC No  ? CBD No  ? Flavoring No  E-cigarette/Vaping Devices ? Disposable No  ? Pre-filled or Refillable Cartridge No  ? Refillable Tank No  ? Pre-filled Pod No  Review of Systems Constitutional: Negative for chills and fever. Gastrointestinal: Negative for abdominal pain. Genitourinary: Positive for pelvic pain. Negative for difficulty urinating, dysuria, frequency and urgency. Musculoskeletal: Positive for back pain (Radiates to pelvic region ). Neurological: Negative for numbness. All other systems reviewed and are negative. Physical ExamED Triage Vitals [09/18/20 1336]BP: 125/79Pulse: 76Pulse from  O2 sat: n/aResp: 16Temp: 98.2 ?F (36.8 ?C)Temp src: TemporalSpO2: 100 % BP 125/79  - Pulse 76  - Temp 98.2 ?F (36.8 ?C) (Temporal)  - Resp 16  - Ht 5' 5 (1.651 m)  - Wt 73.5 kg  - LMP 08/30/2017 (Exact Date) Comment: hyst 08/30/2017 - SpO2 100%  - BMI 26.96 kg/m? Physical ExamVitals and nursing note reviewed. Constitutional:     General: She is not in acute distress.   Appearance: Normal appearance. She is not ill-appearing, toxic-appearing or diaphoretic. HENT:    Head: Normocephalic and atraumatic.  Eyes:    General: No scleral icterus.   Conjunctiva/sclera: Conjunctivae normal. Pulmonary:    Effort: Pulmonary effort is normal. No respiratory distress. Abdominal:    General: There is no distension.    Palpations: Abdomen is soft. There is no mass.    Tenderness: There is no abdominal tenderness. There is no right CVA tenderness, left CVA tenderness, guarding or rebound. Musculoskeletal:    Cervical back: Normal range of motion.    Lumbar back: Tenderness present. No bony tenderness. Decreased range of motion. Positive right straight leg raise test and positive left straight leg raise test.      Back:Skin:   General: Skin is warm and dry. Neurological:    General: No focal deficit present.    Mental Status: She is alert and oriented to person, place, and time.    GCS: GCS eye subscore is 4. GCS verbal subscore is 5. GCS motor subscore is 6.    Cranial Nerves: No cranial nerve deficit.    Sensory: Sensation is intact.    Motor: Motor function is intact. No weakness.    Coordination: Coordination normal.    Gait: Gait is intact.    Deep Tendon Reflexes:    Reflex Scores:     Brachioradialis reflexes are 2+ on the right side and 2+ on the left side.     Patellar reflexes are 2+ on the right side and 2+ on the left side.Psychiatric:       Mood and Affect: Mood normal.       Behavior: Behavior normal.  ProceduresProcedures ED COURSEInterpreted by ED Provider: pulse oximetry, labs and x-rayPatient Reevaluation: 55 y.o. female patient presents to [facility] with complaints of sever lower back pain that came out no where 5 days ago with associated pelvic pressure/pain that worsens when sitting . Pt denies any numbness, fever, chills, tingling, or problems urinating. She has taken motrin and lidocaine patches, which offered no relief. She knows of no known injury to the area. She tried treating pain with heat and epsom salt baths, which offered no relief. She states that pain worsens with movement and touch. PMHx of connective tissue disorder and back pain in February of 2022. Marland Kitchen Plan She denied Toradol when offered. Imagining: XR Lumbar Spine as interpreted by me negative for fractures with mild degenerative changes, official read pending. Labs: Urinalysis  2:54 PMURINE DIP RESULTS:Leukocytes: Negative	Nitrite: NegativeProtein: Negative pH: 7.0Blood: NegativeSpecific Gravity: 1.015Ketone: NegativeGlucose: NegativeMedications: Rx sent for Medrol Dosepack and Flexeril, both should be taken as prescribed. ADVISEDREST FROM STRENUOUS ACTIVITYSTART HEAT TREATMENT THREE TIMES DAILY FOR THE NEXT 5-7 DAYSTAKE PREDNISONE ON A FULL STOMACHYOU MAKE TAKE TYLENOL AS NEEDED FOR PAINDO NOT TAKE FLEXERIL IF DRIVING OR OPERATING MACHINERY, DO NOT COMBINE WITH ALCOHOL OR ANY SEDATING MEDICATION. DO NOT TAKE DURING YOUR WORKING HOURSFOLLOW UP WITH ORTHOPEDISTS/PMD FOR CONTINUITY OF CARE WITHIN 1-5 DAYS, SOONER IF WORSEGO TO THE EMERGENCY ROOM IF ANY WORSENING SYMPTOMSPatient expresses understanding of instructions.Patient progress: stableClinical Impressions as of Sep 18 1457 Lumbosacral strain, initial encounter Sciatica, unspecified laterality  ED DispositionDischargeBy signing my name below, I, Miki Kins, attest that this documentation has been prepared under the direction and in the presence of Dr. Wyatt Haste. This chart was generated with the assistance of a scribe. I was present and performed the services documented. I have reviewed the documentation and verify its accuracy. Gypsy Balsam, MD05/12/22 2010

## 2020-09-23 ENCOUNTER — Encounter: Admit: 2020-09-23 | Payer: PRIVATE HEALTH INSURANCE | Attending: Nephrology | Primary: Internal Medicine

## 2020-11-03 ENCOUNTER — Encounter: Admit: 2020-11-03 | Payer: PRIVATE HEALTH INSURANCE | Attending: Obstetrics and Gynecology | Primary: Internal Medicine

## 2020-11-03 ENCOUNTER — Ambulatory Visit: Admit: 2020-11-03 | Payer: PRIVATE HEALTH INSURANCE | Attending: Obstetrics and Gynecology | Primary: Internal Medicine

## 2020-11-03 DIAGNOSIS — D573 Sickle-cell trait: Secondary | ICD-10-CM

## 2020-11-03 DIAGNOSIS — N189 Chronic kidney disease, unspecified: Secondary | ICD-10-CM

## 2020-11-03 DIAGNOSIS — Q613 Polycystic kidney, unspecified: Secondary | ICD-10-CM

## 2020-11-03 DIAGNOSIS — R002 Palpitations: Secondary | ICD-10-CM

## 2020-11-03 DIAGNOSIS — I340 Nonrheumatic mitral (valve) insufficiency: Secondary | ICD-10-CM

## 2020-11-03 DIAGNOSIS — E559 Vitamin D deficiency, unspecified: Secondary | ICD-10-CM

## 2020-11-03 DIAGNOSIS — K219 Gastro-esophageal reflux disease without esophagitis: Secondary | ICD-10-CM

## 2020-11-03 DIAGNOSIS — K200 Eosinophilic esophagitis: Secondary | ICD-10-CM

## 2020-11-03 DIAGNOSIS — I10 Essential (primary) hypertension: Secondary | ICD-10-CM

## 2020-11-03 DIAGNOSIS — Z01419 Encounter for gynecological examination (general) (routine) without abnormal findings: Secondary | ICD-10-CM

## 2020-11-03 NOTE — Progress Notes
[facility] OF MEDICINE						Department of Obstetrics, Gynecology, Reproductive MedicineCenter for [facility]  Cathy Evans is a 55 y.o. [phone] female who presents for annual exam. The patient has no complaints today. Past Medical History: Diagnosis Date ? Chronic kidney disease   PKD, but normal creatinine ? Eosinophilic esophagitis 08/26/2020 ? GERD (gastroesophageal reflux disease)  ? Hypertension  ? Mitral valve regurgitation  ? Palpitations  ? Polycystic kidney disease  ? Sickle cell trait (HC Code) (HC CODE) (HC Code)   Natera testing 2021 ? Vitamin D deficiency  Past Surgical History: Procedure Laterality Date ? APPENDECTOMY  1984 ? COLONOSCOPY   ? DILATION AND CURETTAGE OF UTERUS   ? HYSTERECTOMY  08/30/2017 ? LAPAROSCOPY   ? LEEP   ? TONSILLECTOMY   ? TUBAL LIGATION   ? UPPER GASTROINTESTINAL ENDOSCOPY   Hysterectomy yesOvaries intact: yesOB History   Gravida 6  Para 3  Term 0  Preterm    AB 3  Living 3   SAB 1  IAB 2  Ectopic    Molar    Multiple    Live Births 3    Menopause: surgical 4/2019HRT use: noLast pap: 2018History of abnormal Pap smear:yesLast mammogram: 2022Social History Tobacco Use ? Smoking status: Passive Smoke Exposure - Never Smoker ? Smokeless tobacco: Never Used Vaping Use ? Vaping Use: Never used Substance Use Topics ? Alcohol use: Not Currently   Comment: social ? Drug use: No Family History Problem Relation Age of Onset ? High cholesterol Mother  ? Hypertension Mother  ? Stroke Mother  ? Diabetes Father  ? Kidney disease Father  ? Hyperthyroidism Sister  ? Diabetes Brother  ? Hypertension Brother  ? Diabetes Brother  ? Breast cancer Maternal Grandmother  ? Asthma Son  ? Ovarian cancer Maternal Aunt  ? Heart attack Paternal Uncle  Patient Active Problem List Diagnosis ? Kidney stone ? Palpitations ? Chronic bilateral low back pain without sciatica ? Essential hypertension ? Lumbar degenerative disc disease ? Lumbar spondylosis ? MVP (mitral valve prolapse) ? Chronic kidney disease ? Polycystic kidney disease ? Vitamin D deficiency ? Anxiety ? Eosinophilic esophagitis Current Outpatient Medications Medication Sig Dispense Refill ? cyclobenzaprine (FLEXERIL) 10 mg tablet Take 1 tablet (10 mg total) by mouth every 12 (twelve) hours as needed for muscle spasms. Do not take with driving/machinery/alcohol/sedating meds 8 tablet 0 ? hydrOXYchloroQUINE (PLAQUENIL) 200 mg tablet TAKE 1 TABLET(200 MG) BY MOUTH DAILY 180 tablet 0 ? lisinopriL (PRINIVIL,ZESTRIL) 5 mg tablet TAKE 1 TABLET(5 MG) BY MOUTH DAILY 90 tablet 2 ? metoprolol succinate XL (TOPROL-XL) 25 mg 24 hr tablet Take 1 tablet (25 mg total) by mouth daily. Take with or immediately following a meal. 90 tablet 3 No current facility-administered medications for this visit.  Allergies Allergen Reactions ? Penicillins Hives   Hives Has patient had a PCN reaction causing immediate rash, facial/tongue/throat swelling, SOB or lightheadedness with hypotension: YESHas patient had a PCN reaction causing severe rash involving mucus membranes or skin necrosis: NOHas patient had a PCN reaction that required hospitalization NOHas patient had a PCN reaction occurring within the last 10 years: NOIf all of the above answers are NO, then may proceed with Cephalosporin use. ? Sulfa (Sulfonamide Antibiotics) Hives and Rash ? Sulfasalazine    rash Review of SystemsGeneral: Denies unintentional weight loss or gain. HEENT: Denies changes in vision and hearing.Respiratory: Denies SOB and cough.?Cardio:  Denies palpitations and chest pain. ?GI: Denies abdominal pain, nausea, vomiting and diarrhea.?GU: Denies dysuria and urinary  frequency.?Musculo: Denies myalgia and joint pain.?Skin: Denies rash and pruritus.Neuro: Denies headache and syncope.?Psych: Denies recent changes in mood. Denies anxiety and depression. Physical Exam:BP 124/80  - Ht 5' 5 (1.651 m)  - Wt 74.8 kg  - LMP 08/30/2017 (Exact Date) Comment: hyst 08/30/2017 - BMI 27.46 kg/m? Body mass index is 27.46 kg/m?Marland KitchenGeneral: calm, cooperate, in NADThyroid: no thyromegaly or nodules noted Heart: RRR without murmurLungs: CTABBreast: soft, normal appearance, symmetrical, non-tender, no masses, no skin changes and no lymphadenopathy bilaterally Abdomen: soft, non-tender, no masses Pelvic: without complaint, deferred Chaperone present for examAssessment: 1. Well woman exam with routine gynecological exam- Mammography Screening Tomo Bilateral; Future- US Breast Bilateral; Future Plan: Reviewed ASCCP guidelines with paps recommended until age 86. Recommend annual mammogram Colonoscopy recommended q 10 years starting at age 32 unless high risk Recommend routine follow-up with PCP Breast self awareness reviewedDiscussed healthy diet and recommendation for regular weight bearing exerciseReturn to care for annual in one year or as neededHeather Dorri Ozturk, CNM

## 2020-12-05 ENCOUNTER — Ambulatory Visit: Admit: 2020-12-05 | Payer: PRIVATE HEALTH INSURANCE | Attending: Nephrology | Primary: Internal Medicine

## 2020-12-12 ENCOUNTER — Encounter: Admit: 2020-12-12 | Payer: PRIVATE HEALTH INSURANCE | Attending: Nephrology | Primary: Internal Medicine

## 2020-12-12 ENCOUNTER — Ambulatory Visit: Admit: 2020-12-12 | Payer: PRIVATE HEALTH INSURANCE | Attending: Nephrology | Primary: Internal Medicine

## 2020-12-12 DIAGNOSIS — E559 Vitamin D deficiency, unspecified: Secondary | ICD-10-CM

## 2020-12-12 DIAGNOSIS — I340 Nonrheumatic mitral (valve) insufficiency: Secondary | ICD-10-CM

## 2020-12-12 DIAGNOSIS — K219 Gastro-esophageal reflux disease without esophagitis: Secondary | ICD-10-CM

## 2020-12-12 DIAGNOSIS — R002 Palpitations: Secondary | ICD-10-CM

## 2020-12-12 DIAGNOSIS — I10 Essential (primary) hypertension: Secondary | ICD-10-CM

## 2020-12-12 DIAGNOSIS — K200 Eosinophilic esophagitis: Secondary | ICD-10-CM

## 2020-12-12 DIAGNOSIS — N189 Chronic kidney disease, unspecified: Secondary | ICD-10-CM

## 2020-12-12 DIAGNOSIS — D573 Sickle-cell trait: Secondary | ICD-10-CM

## 2020-12-12 DIAGNOSIS — Q613 Polycystic kidney, unspecified: Secondary | ICD-10-CM

## 2020-12-12 DIAGNOSIS — Q612 Polycystic kidney, adult type: Secondary | ICD-10-CM

## 2020-12-12 MED ORDER — LISINOPRIL 5 MG TABLET
5 mg | ORAL_TABLET | Freq: Every day | ORAL | 4 refills | Status: AC
Start: 2020-12-12 — End: 2021-06-16

## 2020-12-13 DIAGNOSIS — R7303 Prediabetes: Secondary | ICD-10-CM

## 2020-12-16 ENCOUNTER — Encounter: Admit: 2020-12-16 | Payer: PRIVATE HEALTH INSURANCE | Primary: Internal Medicine

## 2020-12-23 ENCOUNTER — Encounter: Admit: 2020-12-23 | Payer: PRIVATE HEALTH INSURANCE | Attending: Ophthalmology | Primary: Internal Medicine

## 2020-12-31 NOTE — Progress Notes
[facility]                               Specialty Programs:	Cardio-Renal 		Kidney Stones			Glomerular Diseases	Onco- Nephrology			Inherited Diseases	Kidney Disease in Pregnancy			Hypertension		Chronic Kidney DiseaseY[facility], 2nd Floor[address] [facility], [place] [address] [phone] was a telehealth visit conducted during the COVID-19 pandemic.Encounter Date: 08/05/22Primary Care Provider: Damien Fusi Primary conditions for which we are involved:Polycystic kidney disease Update about patient's health, including new symptoms:I saw Cathy Evans on December 12, 2020  for further evaluation and management of polycystic kidney disease.   The patient is a 55 y.o. female who does not have a family history of PKD.  Her father developed end-stage renal disease and underwent a kidney transplant, but she thinks that may have been due to diabetic kidney disease.  She has a history of both kidney and liver cysts and hypertension, but has never had significant loss of renal function. She previously had anemia, but this was due to iron deficiency, and has resolved completely.  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 Past Medical HistoryMerashe Evans has a past medical history of Chronic kidney disease, Eosinophilic esophagitis (08/26/2020), GERD (gastroesophageal reflux disease), Hypertension, Mitral valve regurgitation, Palpitations, Polycystic kidney disease, Sickle cell trait (HC Code) (HC CODE) (HC Code), and Vitamin D deficiency.Medications Current Outpatient Medications Medication Sig Dispense Refill  lisinopriL (PRINIVIL,ZESTRIL) 5 mg tablet TAKE 1 TABLET(5 MG) BY MOUTH DAILY 90 tablet 2  metoprolol succinate XL (TOPROL-XL) 25 mg 24 hr tablet Take 1 tablet (25 mg total) by mouth daily. Take with or immediately following a meal. 90 tablet 3 No current facility-administered medications for this visit. Relevant vitals and labsBP Readings from Last 3 Encounters: 11/03/20 124/80 09/18/20 125/79 08/26/20 (!) 151/91 Lab Results Component Value Date  WBC 5.9 06/27/2020  HGB 13.1 06/27/2020  HCT 38.70 06/27/2020  PLT 287 06/27/2020  GLU 102 (H) 06/27/2020  BUN 7 06/27/2020  CREATININE 0.50 06/27/2020  CO2 22 06/27/2020  CL 104 06/27/2020  NA 141 06/27/2020  K 3.7 06/27/2020  CALCIUM 10.2 06/27/2020  EGFR >60 08/23/2012  PRCRRAU 0.06 12/11/2018  PHOS 3.5 06/27/2020  HGBA1C 5.8 (H) 05/05/2020  IRON 109 05/01/2019  TIBC 335 05/01/2019  FERRITIN 98 05/01/2019 US RENAL - completed 09/14/19 COMPARISON: 01/11/2019. INDICATION: Left flank pain. FINDINGS: The right kidney measures 11.7 cm in length. The left kidney measures 12.1 cm in length. Multiple bilateral renal cysts are noted. On the right, there is a 2.5 x 2.4 x 2.7 cm simple cyst in the mid-lower pole and a 2.9 x 1.5 x 2.7 cm septated cyst laterally in the lower pole (previously measuring 2.2 x 1.3 x 1.8 cm). On the left, there is a 1.2 x 1.3 x 1.4 cm cyst with possible debris in the mid-upper pole. There is no evidence of nephrolithiasis or hydronephrosis. Mild fullness of the right renal pelvis is noted. The urinary bladder is partially distended. Bilateral ureteral jets are visualized.IMPRESSION: 1. No evidence of nephrolithiasis or hydronephrosis.2. Bilateral renal cysts as above. Follow-up renal ultrasound in one year is recommended. Reported And Signed By: Kathrin Greathouse, MD-------------------------------------------------------------------------------------------------------------------------------------------------------------------------------------------------------------Assessment and Plan:In summary, this is a 55 y.o. female with a history of cystic kidneys and a family history of end-stage renal disease.  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 We think that people with heterozygous PKHD1 mutations may have cysts and we believe that this explains her phenotype.  Overall, this is a very good prognostic finding, which is consistent with her normal renal function in her mid 77s and well-controlled blood pressure with minimal medication. Because she has not had blood work checked in some time, I have asked her to check her labs and have ordered these for her.  She had prediabetes when last checked in December and so we will check this again, as well.  I have asked to see her again in about 6 months.Thank you for involving me in her care.-----------------------------------------------------------------------------------------------------------------------------------I have counseled about COVID19 - specifically advising on social distancing, hand washing, staying home when sick. I gave them the [facility] hotline number if they have questions: [phone]-ASK-[phone] have reminded them of our office number should they need a more urgent appointment. VIDEO TELEHEALTH VISIT: This clinician is part of the telehealth program and is conducting this visit in a currently approved location. For this visit the clinician and patient were present via interactive audio & video telecommunications system that permits real-time communications, via the [employer].Patient's use of the telehealth platform followed consent and acknowledges agreement to permit telehealth for this visit. State patient is located in: [place]The clinician is appropriately licensed in the above state to provide care for this visit. Other individuals present during the telehealth encounter and their role/relation: noneIf billing based on time, please complete (Not required if billing based on MDM):                           Total time spent in medical video consultation: 15 min; Total time spent by the provider on the day of service, which includes time spent on chart review, medical video consultation, education, coordination of care/services and counseling: 20 min Because this visit was completed over video, a hands-on physical exam was not performed.  Patient/parent or guardian understands and knows to call back if condition changes.Scribed for Ether Griffins, MD by Garvin Fila, medical scribe August 5, 2022The documentation recorded by the scribe accurately reflects the services I personally performed and the decisions made by me. I reviewed and confirmed all material entered and/or pre-charted by the scribe.

## 2021-01-01 ENCOUNTER — Encounter: Admit: 2021-01-01 | Payer: PRIVATE HEALTH INSURANCE | Attending: Cardiovascular Disease | Primary: Internal Medicine

## 2021-02-10 ENCOUNTER — Encounter: Admit: 2021-02-10 | Payer: PRIVATE HEALTH INSURANCE | Attending: Ophthalmology | Primary: Internal Medicine

## 2021-03-02 ENCOUNTER — Encounter: Admit: 2021-03-02 | Payer: PRIVATE HEALTH INSURANCE | Attending: Nephrology | Primary: Internal Medicine

## 2021-03-02 ENCOUNTER — Encounter: Admit: 2021-03-02 | Payer: PRIVATE HEALTH INSURANCE | Attending: Internal Medicine | Primary: Internal Medicine

## 2021-03-02 ENCOUNTER — Inpatient Hospital Stay: Admit: 2021-03-02 | Discharge: 2021-03-02 | Payer: PRIVATE HEALTH INSURANCE | Primary: Internal Medicine

## 2021-03-02 DIAGNOSIS — N390 Urinary tract infection, site not specified: Secondary | ICD-10-CM

## 2021-03-02 DIAGNOSIS — R7303 Prediabetes: Secondary | ICD-10-CM

## 2021-03-02 DIAGNOSIS — Q612 Polycystic kidney, adult type: Secondary | ICD-10-CM

## 2021-03-02 LAB — URINALYSIS WITH CULTURE REFLEX      (BH LMW YH)
BKR BILIRUBIN, UA: NEGATIVE
BKR BLOOD, UA: NEGATIVE
BKR GLUCOSE, UA: NEGATIVE
BKR KETONES, UA: NEGATIVE
BKR LEUKOCYTE ESTERASE, UA: NEGATIVE
BKR NITRITE, UA: NEGATIVE
BKR PH, UA: 6 (ref 5.5–7.5)
BKR PROTEIN, UA: NEGATIVE
BKR SPECIFIC GRAVITY, UA: 1.014 (ref 1.005–1.030)
BKR UROBILINOGEN, UA (MG/DL): <2 mg/dL (ref <=2.0)

## 2021-03-02 LAB — TREPONEMA PALLIDUM (SYPHILIS) ANTIBODY W/REFLEX
BKR TREPONEMA PALLIDUM ANTIBODY INITIAL RESULT: <0.1 Index
BKR TREPONEMA PALLIDUM ANTIBODY TOTAL, SERUM: NONREACTIVE

## 2021-03-02 LAB — UA REFLEX CULTURE

## 2021-03-02 LAB — BASIC METABOLIC PANEL
BKR ANION GAP: 11 (ref 7–17)
BKR BLOOD UREA NITROGEN: 7 mg/dL (ref 6–20)
BKR BUN / CREAT RATIO: 12.3 (ref 8.0–23.0)
BKR CALCIUM: 9.6 mg/dL (ref 8.8–10.2)
BKR CO2: 27 mmol/L (ref 20–30)
BKR CREATININE: 0.57 mg/dL (ref 0.40–1.30)
BKR EGFR, CREATININE (CKD-EPI 2021): >60 mL/min/1.73m2 (ref >=60)
BKR POTASSIUM: 4.6 mmol/L (ref 3.3–5.3)
BKR SODIUM: 139 mmol/L (ref 136–144)

## 2021-03-02 LAB — HEMOGLOBIN A1C
BKR ESTIMATED AVERAGE GLUCOSE: 117 mg/dL
BKR HEMOGLOBIN A1C: 5.7 % — ABNORMAL HIGH (ref 4.0–5.6)

## 2021-03-02 LAB — HIV-1/HIV-2 ANTIBODY/ANTIGEN SCREEN W/REFLEX     (BH GH LMW YH): BKR HIV 1 AND 2 ANTIBODY/HIV-1 ANTIGEN SCREEN: NEGATIVE

## 2021-03-03 LAB — CHLAMYDIA TRACHOMATIS, NAAT     (BH GH LMW YH): BKR CHLAMYDIA, DNA PROBE: NEGATIVE

## 2021-03-03 LAB — NEISSERIA GONORRHEA, NAAT     (BH GH L LMW YH): BKR NEISSERIA GONORRHOEAE, DNA PROBE: NEGATIVE

## 2021-06-09 ENCOUNTER — Telehealth: Admit: 2021-06-09 | Payer: PRIVATE HEALTH INSURANCE | Attending: Rheumatology | Primary: Internal Medicine

## 2021-06-09 NOTE — Telephone Encounter
[facility] MESSAGETime of call:   2:25 PMCaller:  MerasheCaller's relationship to patient:  selfCalling from (pharmacy, hospital, agency, etc.):  n/aReason for call:  Patient requesting a call back. Patient states she needs lab orders placed before her upcoming appointment. Best telephone number for callback:  [phone] time to return call:   AnytimePermission to leave message:  yes  Olena Mater Specialist

## 2021-06-15 ENCOUNTER — Ambulatory Visit: Admit: 2021-06-15 | Payer: PRIVATE HEALTH INSURANCE | Attending: Nephrology | Primary: Internal Medicine

## 2021-06-16 ENCOUNTER — Encounter: Admit: 2021-06-16 | Payer: PRIVATE HEALTH INSURANCE | Attending: Nephrology | Primary: Internal Medicine

## 2021-06-16 DIAGNOSIS — I10 Essential (primary) hypertension: Secondary | ICD-10-CM

## 2021-06-16 MED ORDER — LISINOPRIL 5 MG TABLET
5 mg | ORAL_TABLET | Freq: Every day | ORAL | 4 refills | Status: AC
Start: 2021-06-16 — End: 2021-11-17

## 2021-06-16 NOTE — Telephone Encounter
Per visit 12/12/20 with dr. Tyrone Sage Medications Disp Refills Start End lisinopriL (PRINIVIL,ZESTRIL) 5 mg tablet 90 tablet 3 12/12/2020  Take 1 tablet (5 mg total) by mouth daily. - Oral No changes noted

## 2021-06-18 ENCOUNTER — Ambulatory Visit: Admit: 2021-06-18 | Payer: PRIVATE HEALTH INSURANCE | Primary: Internal Medicine

## 2021-06-18 ENCOUNTER — Inpatient Hospital Stay: Admit: 2021-06-18 | Payer: PRIVATE HEALTH INSURANCE | Primary: Internal Medicine

## 2021-06-19 ENCOUNTER — Ambulatory Visit: Admit: 2021-06-19 | Payer: PRIVATE HEALTH INSURANCE | Attending: Nephrology | Primary: Internal Medicine

## 2021-06-23 ENCOUNTER — Inpatient Hospital Stay: Admit: 2021-06-23 | Discharge: 2021-06-23 | Payer: PRIVATE HEALTH INSURANCE | Primary: Internal Medicine

## 2021-06-23 DIAGNOSIS — Z0000 Encounter for general adult medical examination without abnormal findings: Secondary | ICD-10-CM

## 2021-06-23 NOTE — Other
CXR wnl, no explanation for cough. Office staff will call pt and let her know she should follow-up in person for an exam and further discussion.Cathy Evans PGY2[facility] Internal MedicineBest contact: M[facility]

## 2021-06-24 ENCOUNTER — Ambulatory Visit: Admit: 2021-06-24 | Payer: PRIVATE HEALTH INSURANCE | Primary: Internal Medicine

## 2021-06-24 LAB — CBC WITH AUTO DIFFERENTIAL
BKR WAM ABSOLUTE IMMATURE GRANULOCYTES.: 0.02 x 1000/??L (ref 0.00–0.30)
BKR WAM ABSOLUTE LYMPHOCYTE COUNT.: 2.57 x 1000/??L (ref 0.60–3.70)
BKR WAM ABSOLUTE NRBC (2 DEC): 0 x 1000/??L (ref 0.00–1.00)
BKR WAM ANALYZER ANC: 3.52 x 1000/??L (ref 2.00–7.60)
BKR WAM BASOPHIL ABSOLUTE COUNT.: 0.06 x 1000/??L (ref 0.00–1.00)
BKR WAM BASOPHILS: 0.8 % (ref 0.0–1.4)
BKR WAM EOSINOPHIL ABSOLUTE COUNT.: 0.37 x 1000/??L (ref 0.00–1.00)
BKR WAM EOSINOPHILS: 5.2 % — ABNORMAL HIGH (ref 0.0–5.0)
BKR WAM HEMATOCRIT (2 DEC): 37.8 % (ref 35.00–45.00)
BKR WAM HEMOGLOBIN: 12.5 g/dL (ref 11.7–15.5)
BKR WAM IMMATURE GRANULOCYTES: 0.3 % (ref 0.0–1.0)
BKR WAM LYMPHOCYTES: 36.3 % (ref 17.0–50.0)
BKR WAM MCH (PG): 29.1 pg (ref 27.0–33.0)
BKR WAM MCHC: 33.1 g/dL (ref 31.0–36.0)
BKR WAM MCV: 87.9 fL (ref 80.0–100.0)
BKR WAM MONOCYTE ABSOLUTE COUNT.: 0.54 x 1000/??L (ref 0.00–1.00)
BKR WAM MPV: 9 fL (ref 8.0–12.0)
BKR WAM NEUTROPHILS: 49.8 % (ref 39.0–72.0)
BKR WAM NUCLEATED RED BLOOD CELLS: 0 % (ref 0.0–1.0)
BKR WAM PLATELETS: 319 x1000/??L (ref 150–420)
BKR WAM RDW-CV: 13.3 % (ref 11.0–15.0)
BKR WAM RED BLOOD CELL COUNT.: 4.3 M/??L (ref 4.00–6.00)
BKR WAM WHITE BLOOD CELL COUNT: 7.1 x1000/??L (ref 4.0–11.0)

## 2021-06-24 LAB — COMPREHENSIVE METABOLIC PANEL
BKR A/G RATIO: 1.2 (ref 1.0–2.2)
BKR ALANINE AMINOTRANSFERASE (ALT): 27 U/L (ref 10–35)
BKR ALBUMIN: 4.2 g/dL (ref 3.6–4.9)
BKR ALKALINE PHOSPHATASE: 93 U/L (ref 9–122)
BKR ANION GAP: 11 g/dL (ref 7–17)
BKR ASPARTATE AMINOTRANSFERASE (AST): 25 U/L (ref 10–35)
BKR AST/ALT RATIO: 0.9
BKR BILIRUBIN TOTAL: 0.3 mg/dL (ref 39.0–<=1.2)
BKR BLOOD UREA NITROGEN: 8 mg/dL (ref 6–20)
BKR BUN / CREAT RATIO: 12.9 (ref 8.0–23.0)
BKR CALCIUM: 9.6 mg/dL (ref 8.8–10.2)
BKR CHLORIDE: 104 mmol/L (ref 98–107)
BKR CO2: 26 mmol/L (ref 20–30)
BKR CREATININE: 0.62 mg/dL (ref 0.40–1.30)
BKR EGFR, CREATININE (CKD-EPI 2021): >60 mL/min/{1.73_m2} (ref >=60)
BKR GLOBULIN: 3.4 g/dL (ref 2.3–3.5)
BKR GLUCOSE: 97 mg/dL (ref 70–100)
BKR POTASSIUM: 4.2 mmol/L (ref 3.3–5.3)
BKR PROTEIN TOTAL: 7.6 g/dL (ref 6.6–8.7)
BKR SODIUM: 141 mmol/L (ref 136–144)
BKR WAM MONOCYTES: 27 U/L (ref 10–35)

## 2021-06-25 NOTE — Other
Normal labwork results conveyed to patient via mychart. Rema Fendt, MD Internal Medicine (Traditional), PGY-1Contact: [employer]02/16/23 9:11 AM

## 2021-06-26 ENCOUNTER — Ambulatory Visit: Admit: 2021-06-26 | Payer: PRIVATE HEALTH INSURANCE | Primary: Internal Medicine

## 2021-06-26 ENCOUNTER — Inpatient Hospital Stay: Admit: 2021-06-26 | Discharge: 2021-06-26 | Payer: PRIVATE HEALTH INSURANCE | Primary: Internal Medicine

## 2021-06-26 DIAGNOSIS — Z20822 Contact with and (suspected) exposure to covid-19: Secondary | ICD-10-CM

## 2021-06-26 DIAGNOSIS — R109 Unspecified abdominal pain: Secondary | ICD-10-CM

## 2021-06-26 DIAGNOSIS — Z0000 Encounter for general adult medical examination without abnormal findings: Secondary | ICD-10-CM

## 2021-06-27 LAB — COMPREHENSIVE METABOLIC PANEL
BKR A/G RATIO: 1.3 (ref 1.0–2.2)
BKR ALANINE AMINOTRANSFERASE (ALT): 30 U/L (ref 10–35)
BKR ALBUMIN: 4.4 g/dL (ref 3.6–4.9)
BKR ALKALINE PHOSPHATASE: 96 U/L (ref 9–122)
BKR ANION GAP: 11 (ref 7–17)
BKR ASPARTATE AMINOTRANSFERASE (AST): 30 U/L (ref 10–35)
BKR AST/ALT RATIO: 1 pg (ref 27.0–33.0)
BKR BILIRUBIN TOTAL: 0.4 mg/dL (ref <=1.2)
BKR BLOOD UREA NITROGEN: 9 mg/dL (ref 6–20)
BKR BUN / CREAT RATIO: 14.8 (ref 8.0–23.0)
BKR CALCIUM: 9.9 mg/dL (ref 8.8–10.2)
BKR CHLORIDE: 101 mmol/L (ref 98–107)
BKR CO2: 25 mmol/L (ref 20–30)
BKR CREATININE: 0.61 mg/dL (ref 0.40–1.30)
BKR EGFR, CREATININE (CKD-EPI 2021): >60 mL/min/{1.73_m2} (ref >=60)
BKR GLOBULIN: 3.4 g/dL (ref 2.3–3.5)
BKR GLUCOSE: 92 mg/dL (ref 70–100)
BKR POTASSIUM: 4.2 mmol/L (ref 3.3–5.3)
BKR PROTEIN TOTAL: 7.8 g/dL (ref 6.6–8.7)
BKR SODIUM: 137 mmol/L (ref 136–144)

## 2021-06-27 LAB — INFLUENZA A+B/RSV BY RT-PCR
BKR INFLUENZA A: NEGATIVE mg/dL (ref 6–20)
BKR INFLUENZA B: NEGATIVE mg/dL (ref 0.40–1.30)
BKR RESPIRATORY SYNCYTIAL VIRUS: NEGATIVE mg/dL (ref 8.8–10.2)

## 2021-06-27 LAB — LIPASE: BKR LIPASE: 19 U/L (ref 11–55)

## 2021-06-27 LAB — SARS COV-2 (COVID-19) RNA-~~LOC~~ LABS (BH GH LMW YH): BKR SARS-COV-2 RNA (COVID-19) (YH): NEGATIVE

## 2021-06-28 NOTE — Other
Patient informed of negative COVID/flu/RSV results via mychart. Rema Fendt, MD Internal Medicine (Traditional), PGY-1Contact: [employer]02/19/23 1:06 PM

## 2021-06-30 ENCOUNTER — Encounter: Admit: 2021-06-30 | Payer: PRIVATE HEALTH INSURANCE | Attending: Rheumatology | Primary: Internal Medicine

## 2021-07-06 ENCOUNTER — Ambulatory Visit: Admit: 2021-07-06 | Payer: PRIVATE HEALTH INSURANCE | Attending: Nephrology | Primary: Internal Medicine

## 2021-09-08 ENCOUNTER — Encounter: Admit: 2021-09-08 | Payer: PRIVATE HEALTH INSURANCE | Attending: Rheumatology | Primary: Internal Medicine

## 2021-09-09 ENCOUNTER — Ambulatory Visit: Admit: 2021-09-09 | Payer: PRIVATE HEALTH INSURANCE | Primary: Internal Medicine

## 2021-09-09 ENCOUNTER — Inpatient Hospital Stay: Admit: 2021-09-09 | Payer: PRIVATE HEALTH INSURANCE | Primary: Internal Medicine

## 2021-09-22 ENCOUNTER — Encounter: Admit: 2021-09-22 | Payer: PRIVATE HEALTH INSURANCE | Attending: Ophthalmology | Primary: Internal Medicine

## 2021-10-09 ENCOUNTER — Emergency Department: Admit: 2021-10-09 | Payer: PRIVATE HEALTH INSURANCE | Primary: Internal Medicine

## 2021-10-09 ENCOUNTER — Inpatient Hospital Stay: Admit: 2021-10-09 | Discharge: 2021-10-09 | Payer: PRIVATE HEALTH INSURANCE

## 2021-10-09 DIAGNOSIS — Z882 Allergy status to sulfonamides status: Secondary | ICD-10-CM

## 2021-10-09 DIAGNOSIS — Z880 Allergy status to penicillin: Secondary | ICD-10-CM

## 2021-10-09 DIAGNOSIS — Z881 Allergy status to other antibiotic agents status: Secondary | ICD-10-CM

## 2021-10-09 DIAGNOSIS — R220 Localized swelling, mass and lump, head: Principal | ICD-10-CM

## 2021-10-09 DIAGNOSIS — Z79899 Other long term (current) drug therapy: Secondary | ICD-10-CM

## 2021-10-09 DIAGNOSIS — R590 Localized enlarged lymph nodes: Secondary | ICD-10-CM

## 2021-10-09 DIAGNOSIS — K1120 Sialoadenitis, unspecified: Secondary | ICD-10-CM

## 2021-10-09 LAB — CBC WITH AUTO DIFFERENTIAL
BKR WAM ABSOLUTE IMMATURE GRANULOCYTES.: 0.01 x 1000/ÂµL (ref 0.00–0.30)
BKR WAM ABSOLUTE LYMPHOCYTE COUNT.: 1.86 x 1000/ÂµL (ref 0.60–3.70)
BKR WAM ABSOLUTE NRBC (2 DEC): 0 x 1000/ÂµL (ref 0.00–1.00)
BKR WAM ANALYZER ANC: 2.04 x 1000/ÂµL (ref 2.00–7.60)
BKR WAM BASOPHIL ABSOLUTE COUNT.: 0.04 x 1000/ÂµL (ref 0.00–1.00)
BKR WAM BASOPHILS: 0.8 % (ref 0.0–1.4)
BKR WAM EOSINOPHIL ABSOLUTE COUNT.: 0.39 x 1000/ÂµL (ref 0.00–1.00)
BKR WAM EOSINOPHILS: 8 % — ABNORMAL HIGH (ref 0.0–5.0)
BKR WAM HEMATOCRIT (2 DEC): 38.7 % (ref 35.00–45.00)
BKR WAM HEMOGLOBIN: 12.9 g/dL (ref 11.7–15.5)
BKR WAM IMMATURE GRANULOCYTES: 0.2 % (ref 0.0–1.0)
BKR WAM LYMPHOCYTES: 38.4 % (ref 17.0–50.0)
BKR WAM MCH (PG): 29 pg (ref 27.0–33.0)
BKR WAM MCHC: 33.3 g/dL (ref 31.0–36.0)
BKR WAM MCV: 87 fL (ref 80.0–100.0)
BKR WAM MONOCYTE ABSOLUTE COUNT.: 0.51 x 1000/ÂµL (ref 0.00–1.00)
BKR WAM MONOCYTES: 10.5 % (ref 4.0–12.0)
BKR WAM MPV: 8.8 fL (ref 8.0–12.0)
BKR WAM NEUTROPHILS: 42.1 % (ref 39.0–72.0)
BKR WAM NUCLEATED RED BLOOD CELLS: 0 % (ref 0.0–1.0)
BKR WAM PLATELETS: 325 x1000/ÂµL (ref 150–420)
BKR WAM RDW-CV: 13.2 % (ref 11.0–15.0)
BKR WAM RED BLOOD CELL COUNT.: 4.45 M/ÂµL (ref 4.00–6.00)
BKR WAM WHITE BLOOD CELL COUNT: 4.9 x1000/ÂµL (ref 4.0–11.0)

## 2021-10-09 MED ORDER — SODIUM CHLORIDE 0.9 % LARGE VOLUME SYRINGE FOR AUTOINJECTOR
Freq: Once | INTRAVENOUS | Status: CP | PRN
Start: 2021-10-09 — End: ?
  Administered 2021-10-09: 18:00:00 via INTRAVENOUS

## 2021-10-09 MED ORDER — IOHEXOL 350 MG IODINE/ML INTRAVENOUS SOLUTION
350 mg iodine/mL | Freq: Once | INTRAVENOUS | Status: CP | PRN
Start: 2021-10-09 — End: ?
  Administered 2021-10-09: 18:00:00 350 mL via INTRAVENOUS

## 2021-10-09 NOTE — ED Provider Notes
Chief Complaint Patient presents with ? Facial Swelling   Patient here with ?right cheek/ear swelling since last night. Hx of abscess. Has cracked tooth on opposite side. Has no teeth on right side of mouth. Denied ear pain. Reports tightness to site, airway intact.  HPI/PE:This is a 56 year old female who comes to emergency room for evaluation of right-sided facial swelling, 1st noticed last evening.  She denies any recent illnesses or fevers.  She denies ear pain or sore throat.  She denies any dental infection on that side, noting she has very few teeth on that side of her mouth.  She denies any difficulty swallowing.  She does report intermittent headaches over the last week.  She is unsure if this is related.On my exam there is swelling and induration to the preauricular space.  Extending inferiorly to the angle of the mandible and then posteriorly just behind the ear.  There is no mastoid tenderness.  There is no tenderness to this indurated area.  Intraoral examination shows no obvious abscess or dental infection.  Her oropharynx is otherwise clear.  She is tolerating her secretions.  Right ear exam shows normal canal and normal TM, no signs of infection there.[id number] diagnosis: Early abscess, adenopathy, parotiditisPlan: Will obtain labs and a [place] of the face.Willacy My read: no obvious abscessRadiology read: parotitis, supraclavicular and mediastinal nodes, rec outpt [place] chestIncidental findings discussed with patient, she will schedule appt with PMD to obtain [place] chest. Dr Juventino Slovak was available for consultation.An acute or life threatening problem was considered during this evaluation  A decision regarding hospitalization was made during this visit  Patient does not require admission or further ED Observation at this time  Independent interpretation of: [place]  Physical ExamED Triage Vitals [10/09/21 1019]BP: (!) 154/93Pulse: 84Pulse from  O2 sat: n/aResp: 16Temp: 98.4 ?F (36.9 ?C)Temp src: n/aSpO2: 96 % BP (!) 154/93  - Pulse 84  - Temp 98.4 ?F (36.9 ?C)  - Resp 16  - Wt 82.1 kg  - LMP 08/30/2017 (Exact Date) Comment: hyst 08/30/2017 - SpO2 96%  - BMI 30.12 kg/m? Physical Exam ProceduresAttestation/Critical CareClinical Impressions as of 10/09/21 1416 Parotitis Supraclavicular adenopathy Mediastinal adenopathy  ED DispositionDischarge Shayne Alken, PA06/02/23 1108 Shayne Alken, PA06/02/23 1417

## 2021-10-09 NOTE — Discharge Instructions
Sour candies, warm compresses to face. Follow up with your PMD to schedule a [place] of your chest to evaluated for adenopathy incidentally seen on your [place] scan today.

## 2021-10-09 NOTE — ED Notes
10:25 AM Pt arrived to walk in triage from home with c/o right sided facial swelling and low back pain. Pt endorses her back started hurting last week. Pt endorses she noticed the facial swelling last night. Pt endorses taking tylenol which was effective for her back pain. Pt denies any pain in her ear/neck/face. Pt endorses having a headache and sharp pains in her head yesterday. Pt denies fever, chills, N/V/D, CP, SOB, change in vision. Pt is AA&O x4, speaking in clear, complete sentences. 2:30 PMPt DC by provider. Pt out of department prior to DC vitals.

## 2021-10-18 ENCOUNTER — Inpatient Hospital Stay: Admit: 2021-10-18 | Discharge: 2021-10-18 | Payer: PRIVATE HEALTH INSURANCE | Primary: Internal Medicine

## 2021-10-18 DIAGNOSIS — R590 Localized enlarged lymph nodes: Secondary | ICD-10-CM

## 2021-10-18 MED ORDER — IOHEXOL 350 MG IODINE/ML INTRAVENOUS SOLUTION
350 mg iodine/mL | Freq: Once | INTRAVENOUS | Status: CP | PRN
Start: 2021-10-18 — End: ?
  Administered 2021-10-18: 15:00:00 350 mL via INTRAVENOUS

## 2021-10-18 MED ORDER — SODIUM CHLORIDE 0.9 % LARGE VOLUME SYRINGE FOR AUTOINJECTOR
Freq: Once | INTRAVENOUS | Status: CP | PRN
Start: 2021-10-18 — End: ?
  Administered 2021-10-18: 15:00:00 via INTRAVENOUS

## 2021-10-21 ENCOUNTER — Inpatient Hospital Stay: Admit: 2021-10-21 | Discharge: 2021-10-21 | Payer: PRIVATE HEALTH INSURANCE | Primary: Internal Medicine

## 2021-10-21 ENCOUNTER — Telehealth: Admit: 2021-10-21 | Payer: PRIVATE HEALTH INSURANCE | Primary: Internal Medicine

## 2021-10-21 DIAGNOSIS — N644 Mastodynia: Secondary | ICD-10-CM

## 2021-10-21 NOTE — Telephone Encounter
Received call from: Cathy Evans is: Mediastinal lymphadenopathyWas a referral entered for patient YESBest telephone number for call back: [phone]

## 2021-10-27 ENCOUNTER — Encounter: Admit: 2021-10-27 | Payer: PRIVATE HEALTH INSURANCE | Attending: Rheumatology | Primary: Internal Medicine

## 2021-10-27 ENCOUNTER — Inpatient Hospital Stay: Admit: 2021-10-27 | Discharge: 2021-10-27 | Payer: PRIVATE HEALTH INSURANCE | Primary: Internal Medicine

## 2021-10-27 DIAGNOSIS — E559 Vitamin D deficiency, unspecified: Secondary | ICD-10-CM

## 2021-10-27 DIAGNOSIS — R590 Localized enlarged lymph nodes: Secondary | ICD-10-CM

## 2021-10-27 DIAGNOSIS — K219 Gastro-esophageal reflux disease without esophagitis: Secondary | ICD-10-CM

## 2021-10-27 DIAGNOSIS — K200 Eosinophilic esophagitis: Secondary | ICD-10-CM

## 2021-10-27 DIAGNOSIS — D573 Sickle-cell trait: Secondary | ICD-10-CM

## 2021-10-27 DIAGNOSIS — Q613 Polycystic kidney, unspecified: Secondary | ICD-10-CM

## 2021-10-27 DIAGNOSIS — R002 Palpitations: Secondary | ICD-10-CM

## 2021-10-27 DIAGNOSIS — I10 Essential (primary) hypertension: Secondary | ICD-10-CM

## 2021-10-27 DIAGNOSIS — N189 Chronic kidney disease, unspecified: Secondary | ICD-10-CM

## 2021-10-27 DIAGNOSIS — I340 Nonrheumatic mitral (valve) insufficiency: Secondary | ICD-10-CM

## 2021-10-27 LAB — SEDIMENTATION RATE (ESR): BKR SEDIMENTATION RATE, ERYTHROCYTE: 34 mm/h — ABNORMAL HIGH (ref 0–20)

## 2021-10-27 LAB — C-REACTIVE PROTEIN     (CRP): BKR C-REACTIVE PROTEIN, HIGH SENSITIVITY: 5.1 mg/L — ABNORMAL HIGH (ref 1.49–3.11)

## 2021-10-27 LAB — RHEUMATOID FACTOR: BKR RHEUMATOID FACTOR: <10 IU/mL (ref 0.01–<14.0)

## 2021-10-27 LAB — IMMUNOGLOBULINS IGG, IGA, IGM
BKR IGA: 456 mg/dL (ref 70–470)
BKR IGG: 1625 mg/dL — ABNORMAL HIGH (ref 700–1600)
BKR IGM: 118 mg/dL (ref 40–230)

## 2021-10-27 LAB — ANGIOTENSIN CONVERTING ENZYME: BKR ANGIOTENSIN CONVERTING ENZYME: 60 U/L (ref 12–82)

## 2021-10-27 LAB — TOTAL PROTEIN AND ALBUMIN,SERUM  (YH)
BKR ALBUMIN, SERUM: 4.4 g/dL (ref 3.6–4.9)
BKR TOTAL PROTEIN, SERUM: 7.3 g/dL (ref 6.0–8.3)

## 2021-10-27 LAB — C3 COMPLEMENT: BKR C3 COMPLEMENT: 170 mg/dL (ref 90–180)

## 2021-10-27 LAB — C4 COMPLEMENT     (BH GH L LMW YH): BKR C4 COMPLEMENT: 38 mg/dL (ref 10–40)

## 2021-10-27 NOTE — Patient Instructions
Labs at [employer]

## 2021-10-28 LAB — IMMUNOFIXATION ELECTROPHORESIS, SERUM

## 2021-10-29 ENCOUNTER — Ambulatory Visit: Admit: 2021-10-29 | Payer: PRIVATE HEALTH INSURANCE | Attending: Pulmonary Disease | Primary: Internal Medicine

## 2021-10-29 ENCOUNTER — Encounter: Admit: 2021-10-29 | Payer: PRIVATE HEALTH INSURANCE | Primary: Internal Medicine

## 2021-10-29 ENCOUNTER — Encounter: Admit: 2021-10-29 | Payer: PRIVATE HEALTH INSURANCE | Attending: Pulmonary Disease | Primary: Internal Medicine

## 2021-10-29 DIAGNOSIS — R002 Palpitations: Secondary | ICD-10-CM

## 2021-10-29 DIAGNOSIS — K200 Eosinophilic esophagitis: Secondary | ICD-10-CM

## 2021-10-29 DIAGNOSIS — D573 Sickle-cell trait: Secondary | ICD-10-CM

## 2021-10-29 DIAGNOSIS — N189 Chronic kidney disease, unspecified: Secondary | ICD-10-CM

## 2021-10-29 DIAGNOSIS — I10 Essential (primary) hypertension: Secondary | ICD-10-CM

## 2021-10-29 DIAGNOSIS — K219 Gastro-esophageal reflux disease without esophagitis: Secondary | ICD-10-CM

## 2021-10-29 DIAGNOSIS — I340 Nonrheumatic mitral (valve) insufficiency: Secondary | ICD-10-CM

## 2021-10-29 DIAGNOSIS — Q613 Polycystic kidney, unspecified: Secondary | ICD-10-CM

## 2021-10-29 DIAGNOSIS — E559 Vitamin D deficiency, unspecified: Secondary | ICD-10-CM

## 2021-10-29 LAB — PROTEIN ELECTROPHORESIS, SERUM    (YH)
BKR ALBUMIN ELP: 4.43 g/dL (ref 3.50–4.70)
BKR ALPHA-1-GLOBULIN: 0.18 g/dL (ref 0.10–0.30)
BKR ALPHA-2-GLOBULIN: 0.6 g/dL (ref 0.60–1.00)
BKR BETA GLOBULIN: 0.97 g/dL (ref 0.70–1.20)
BKR GAMMA GLOBULIN: 1.12 g/dL (ref 0.70–1.50)

## 2021-10-29 LAB — ANA BY IFA W/RFLX TO DSDNA, RNP, SM, SSA, AND SSB ABS     (YH): BKR ANTI-NUCLEAR ANTIBODY (ANA): POSITIVE — AB

## 2021-10-29 NOTE — Progress Notes
[facility] HospitalSmilow Cancer CenterInterventional PulmonologyErin Ridgefield, [phone] Care Team:Astafiev, Viviann Spare, MD as PCP - Dorthula Matas, MD as Cardiologist (Cardiovascular Disease)Rivero-Gutierrez, Evans Lance, MD as Resident (Internal Medicine)Vining, Ellouise Newer, MD as Physician (Otolaryngology) Patient Name:  Cathy Evans of Birth:  1967-05-25EPIC MRN:  [id number] Date of Service: 6/22/2023I saw Deeann Dowse in  consulation for  Mediastinal LAD. Shewas referred by Dr. Brayton El.  History of the Present Illness:[name] is a 56 y.o. female with a PMH HTN, and + ANA. She recently was in the ED for right neck swelling and was dx w parotiditis. Her work up included a [place] neck which noted mediastinal LAD. Full [place] chest confirmed the same with the absence of significant lung parenchymal findings. She was referred for further eval and work up. Her history is notable for question of mixed connective tissue disease. She had a + ANA several years ago. Due to MSK/joint pain she was started on HCQ which she took for a couple of years but self dc'ed about a year ago. She is uncertain if it helped her symptoms. Symptoms past and present are well described in recent Rheum note. Serologic wu was largely unremarkable aside from + ANA. Notably neg RF, normal ACE, normal SCL-70. Symptoms notable for dry eyes, dry mouth, dysphagia (EGD w esophagitis, PPI did not help), intermittent palpitations (on metop, no different), abdominal cramping (feels like menstrual cramps but s/p hysterectomy for endometriosis many years ago; [place] abd tomorrow), intermittent tingling in hands, intermittent blurry vision, intermittent dry cough. Notably she denies fevers, sweats, weight loss, skin rashes. Prior imaging:: She had a [place] neck done at OSH in 6/22 for occipital LAD that did not note any LAD in the chest. She notes that at the time she had some swelling in the back of her right head in the occipital region which then spontaneously improved. Work: Works as Lawyer at [employer] Regular PPDs, no TB exposure known. No international travel. SH: No personal tobacco, a lot of SHS. Cancer history: no personal history. Recent Mammo w normal [place]. Review of Systems: ?	Constitutional: negative?	HEENT: negative?	Respiratory: as discussed above?	Cardiovascular: negative?	Gastrointestinal: negative?	Genitourinary: negative?	Musculoskeletal: negative?	Skin: negative?	Neurological: negative?	Endocrine: negative?	Hematologic/Immunologic: negative?	Psychiatric: negativeAll other systems were reviewed and are negative.Past Medical History:[name] has a past medical history of Chronic kidney disease, Eosinophilic esophagitis (08/26/2020), GERD (gastroesophageal reflux disease), Hypertension, Mitral valve regurgitation, Palpitations, Polycystic kidney disease, Sickle cell trait (HC Code) (HC CODE) (HC Code), and Vitamin D deficiency.[name] has a past surgical history that includes Tubal ligation; LEEP; Dilation and curettage of uterus; laparoscopy; Tonsillectomy; Hysterectomy (08/30/2017); Appendectomy (1984); Colonoscopy; and Upper gastrointestinal endoscopy.Family History:Her family history includes Asthma in her son; Breast cancer in her maternal grandmother; Diabetes in her brother, brother, and father; Heart attack in her paternal uncle; High cholesterol in her mother; Hypertension in her brother and mother; Hyperthyroidism in her sister; Kidney disease in her father; Ovarian cancer in her maternal aunt; Stroke in her mother.Allergies/ADRs:Penicillins, Sulfa (sulfonamide antibiotics), Moxifloxacin, and SulfasalazineHome Medications:Outpatient Medications Prior to Visit Medication Sig Dispense Refill ? diphenhydrAMINE (BENADRYL) 25 mg capsule Take 1 capsule (25 mg total) by mouth every 6 (six) hours as needed for itching (rash) for up to 8 days. 30 capsule 0 ? famotidine (PEPCID) 20 mg tablet Take 1 tablet (20 mg total) by mouth 2 (two) times daily. 30 tablet 0 ? lisinopriL (PRINIVIL,ZESTRIL) 5 mg tablet Take 1 tablet (5 mg total) by mouth daily. 90 tablet 3 ? metoprolol succinate XL (TOPROL-XL) 25 mg 24 hr tablet Take 1 tablet (25  mg total) by mouth daily. Take with or immediately following a meal. 90 tablet 3 No facility-administered medications prior to visit. Physical Exam:Physical Examination:Vital signs:  Blood pressure 131/88, pulse 83, temperature 97 ?F (36.1 ?C), temperature source Temporal, resp. rate 18, height 5' 5 (1.651 m), weight 80.9 kg, last menstrual period 08/30/2017, SpO2 99 %, not currently breastfeeding. Gen: No acute distress, breathing comfortably on RAHEENT:  No scleral icterus. Notable, no palpable supraclav nodes Neck: No palpable LAD.CV: rrrLungs: ctabAbdomen: Soft, nontender and nondistended.LE: no edema bilaterally. Neuro: awake, alert, no focal findings Diagnostic Imaging (personally reviewed, interpreted): ?	[place] Chest 6/23: Left subclavicular, superior mediastinal, right paratracheal, prevascular, bilateral hilar and subcarinal adenopathy. Largest nodes measure up to 14 mm in the subcarinal region.?	[place] Neck 6/23: There is mild asymmetric enlargement of the right parotid gland, which demonstrates heterogeneous enhancement and minimal inflammatory changes. Additionally, there are adjacent prominent lymph nodes, likely reactive. No evidence of ductal dilatation or obstruction.Laboratory Data:10/2021:CRP 5.1, ESR 34Neg ACE, RFNormal complement12/2019ANA     1:1280 nuclear, multiple nuclear dotsdsDNA Negative Scl-70  Negative SSA     NegativeSSB     NegativeJo-1     NegativeCCP     <16RF IgM            10ANCA  NegativeSm Ab NegativeMito Ab            Negative C3        161C4        42Impression and Recommendations:[name] is a 56 y.o. female with a ho HTN, + ANA w ? CTD who presents for evaluation of incidentally noted mediastinal LAD. Pattern of LAD in the absence of signs/symptoms of a lymphoproliferative do and with concurrent chronic MSK symptoms is most suggestive of an underlying inflammatory disorder, sarcoidosis. Her ACE is not elevated but this is not diagnostic of sarcoid. Mediastinal LAD can also be seen w SLE and MCTD but less commonly so. Given lack of solid tumor malignancy in the chest or neck, I am less concerned for metastatic LAD but I will fu her [place] abdomen. Overall we discussed the role of EBUS bx to elucidate the etiology. She is agreeable. Will schedule EBUS in the coming weeks. Idamae Schuller, MDYale Interventional PulmonaryElectronically Signed by Oliver Pila, MD, October 29, 2021

## 2021-10-30 ENCOUNTER — Inpatient Hospital Stay: Admit: 2021-10-30 | Discharge: 2021-10-30 | Payer: PRIVATE HEALTH INSURANCE | Primary: Internal Medicine

## 2021-10-30 DIAGNOSIS — R590 Localized enlarged lymph nodes: Secondary | ICD-10-CM

## 2021-10-30 LAB — ANA TITER AND PATTERN     (YH)
BKR ANA TITER 1: >=1:2560 {titer} — AB
BKR ANA TITER 2: 1:160 {titer} — AB (ref <7.0)

## 2021-10-30 LAB — IMMUNOGLOBULIN G SUBCLASS 4     (BH GH LMW Q YH): BKR IMMUNOGLOBULIN G SUBCLASS 4: 33.5 mg/dL (ref 3.9–86.4)

## 2021-10-30 MED ORDER — SODIUM CHLORIDE 0.9 % LARGE VOLUME SYRINGE FOR AUTOINJECTOR
Freq: Once | INTRAVENOUS | Status: CP | PRN
Start: 2021-10-30 — End: ?
  Administered 2021-10-30: 17:00:00 via INTRAVENOUS

## 2021-10-30 MED ORDER — IOHEXOL 350 MG IODINE/ML INTRAVENOUS SOLUTION
350 mg iodine/mL | Freq: Once | INTRAVENOUS | Status: CP | PRN
Start: 2021-10-30 — End: ?
  Administered 2021-10-30: 17:00:00 350 mL via INTRAVENOUS

## 2021-11-02 ENCOUNTER — Encounter: Admit: 2021-11-02 | Payer: PRIVATE HEALTH INSURANCE | Attending: Nephrology | Primary: Internal Medicine

## 2021-11-02 ENCOUNTER — Ambulatory Visit: Admit: 2021-11-02 | Payer: PRIVATE HEALTH INSURANCE | Attending: Nephrology | Primary: Internal Medicine

## 2021-11-02 DIAGNOSIS — I340 Nonrheumatic mitral (valve) insufficiency: Secondary | ICD-10-CM

## 2021-11-02 DIAGNOSIS — D573 Sickle-cell trait: Secondary | ICD-10-CM

## 2021-11-02 DIAGNOSIS — N189 Chronic kidney disease, unspecified: Secondary | ICD-10-CM

## 2021-11-02 DIAGNOSIS — K219 Gastro-esophageal reflux disease without esophagitis: Secondary | ICD-10-CM

## 2021-11-02 DIAGNOSIS — I10 Essential (primary) hypertension: Secondary | ICD-10-CM

## 2021-11-02 DIAGNOSIS — E559 Vitamin D deficiency, unspecified: Secondary | ICD-10-CM

## 2021-11-02 DIAGNOSIS — Q613 Polycystic kidney, unspecified: Secondary | ICD-10-CM

## 2021-11-02 DIAGNOSIS — K200 Eosinophilic esophagitis: Secondary | ICD-10-CM

## 2021-11-02 DIAGNOSIS — R002 Palpitations: Secondary | ICD-10-CM

## 2021-11-02 NOTE — Patient Instructions
No changes made to your medicationsYour kidney function is stableDont take any Nsaids

## 2021-11-02 NOTE — Progress Notes
[facility]                               Specialty Programs:	Cardio-Renal 		Kidney Stones			Glomerular Diseases	Onco- Nephrology			Inherited Diseases	Kidney Disease in Pregnancy			Hypertension		Chronic Kidney DiseaseY[facility], 2nd Floor[address] [place], [place] [address]UE[place] [phone] Date: 06/26/23Primary Care Provider: Oneita Jolly Ahmed Previously was followed by Dr Corey Harold DahlPrimary conditions for which we are involved:Polycystic kidney disease Update about patient's health, including new symptoms:The patient is a 56 y.o. female who does not have a family history of PKD.  Her father developed end-stage renal disease and underwent a kidney transplant, but she thinks that may have been due to diabetic kidney disease.  She has a history of both kidney and liver cysts and hypertension, but has never had significant loss of renal function. She previously had anemia, but this was due to iron deficiency, and has resolved completely.  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 Renal function has been stable Past Medical HistoryMerashe Hypes has a past medical history of Chronic kidney disease, Eosinophilic esophagitis (08/26/2020), GERD (gastroesophageal reflux disease), Hypertension, Mitral valve regurgitation, Palpitations, Polycystic kidney disease, Sickle cell trait (HC Code) (HC CODE) (HC Code), and Vitamin D deficiency.Medications Current Outpatient Medications Medication Sig Dispense Refill ? metoprolol succinate XL (TOPROL-XL) 25 mg 24 hr tablet Take 1 tablet (25 mg total) by mouth daily. Take with or immediately following a meal. 90 tablet 3 ? famotidine (PEPCID) 20 mg tablet Take 1 tablet (20 mg total) by mouth 2 (two) times daily. 30 tablet 0 ? lisinopriL (PRINIVIL,ZESTRIL) 5 mg tablet Take 1 tablet (5 mg total) by mouth daily. 90 tablet 3 No current facility-administered medications for this visit. Relevant vitals and labsBP Readings from Last 3 Encounters: 11/02/21 125/84 10/30/21 127/84 10/29/21 131/88 Lab Results Component Value Date  WBC 4.9 10/09/2021  HGB 12.9 10/09/2021  HCT 42.0 10/09/2021  PLT 325 10/09/2021  GLU 96 10/09/2021  BUN 9 06/26/2021  CREATININE 0.6 10/09/2021  CO2 25 06/26/2021  CL 101 06/26/2021  NA 137 06/26/2021  K 4.5 10/09/2021  CALCIUM 9.9 06/26/2021  EGFR >60 08/23/2012  PRCRRAU 0.06 12/11/2018  PHOS 3.5 06/27/2020  HGBA1C 5.7 (H) 03/02/2021  IRON 109 05/01/2019  TIBC 335 05/01/2019  FERRITIN 98 05/01/2019 US RENAL - completed 09/14/19 COMPARISON: 01/11/2019. INDICATION: Left flank pain. FINDINGS: The right kidney measures 11.7 cm in length. The left kidney measures 12.1 cm in length. Multiple bilateral renal cysts are noted. On the right, there is a 2.5 x 2.4 x 2.7 cm simple cyst in the mid-lower pole and a 2.9 x 1.5 x 2.7 cm septated cyst laterally in the lower pole (previously measuring 2.2 x 1.3 x 1.8 cm). On the left, there is a 1.2 x 1.3 x 1.4 cm cyst with possible debris in the mid-upper pole. There is no evidence of nephrolithiasis or hydronephrosis. Mild fullness of the right renal pelvis is noted. The urinary bladder is partially distended. Bilateral ureteral jets are visualized.IMPRESSION: 1. No evidence of nephrolithiasis or hydronephrosis.2. Bilateral renal cysts as above. Follow-up renal ultrasound in one year is recommended. Reported And Signed By: Kathrin Greathouse, MD-------------------------------------------------------------------------------------------------------------------------------------------------------------------------------------------------------------Assessment and Plan:In summary, this is a 56  y.o. female with a history of cystic kidneys and a family history of end-stage renal disease.  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 We think that people with heterozygous PKHD1 mutations may have cysts and we believe that this explains her phenotype.  Overall, this is a very good prognostic finding, which is consistent with her normal renal function in her mid 100s and well-controlled blood pressure with minimal medication. She stopped lisinopril as she Is on metoprololFrom renal standpoint patient is stable. She has other comorbids for which she is following with multiple sub specialtiesPatient will be seen back in about a yearMaryam Rogene Meth MDAssistant Professor of MedicineSection of NephrologyCell # [phone]

## 2021-11-03 DIAGNOSIS — I10 Essential (primary) hypertension: Secondary | ICD-10-CM

## 2021-11-03 LAB — DSDNA ANTIBODY, IGG WITH IFA CONFIRMATION     (BH LMW YH): BKR DSDNA AB: 1.2 IU/mL (ref <10.0)

## 2021-11-03 LAB — EXTRACTABLE NUCLEAR ANTIGEN ABS GROUP     (BH LMW YH)
BKR RNP (U1RNP) AB: 1.9 {ELISA'U} (ref <5.0)
BKR SM AB: 0.8 {ELISA'U} (ref <7.0)
BKR SS-A AB: 0.6 {ELISA'U} (ref <7.0)
BKR SS-B AB: 0.5 EliA U/mL (ref <7.0)

## 2021-11-03 LAB — CYCLIC CITRUL PEPTIDE ANTIBODY, IGG: BKR CCP AB, IGG: 1.8 EliA U/mL (ref <7.0)

## 2021-11-04 ENCOUNTER — Ambulatory Visit: Admit: 2021-11-04 | Payer: PRIVATE HEALTH INSURANCE | Primary: Internal Medicine

## 2021-11-04 NOTE — Other
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

## 2021-11-05 ENCOUNTER — Ambulatory Visit: Admit: 2021-11-05 | Payer: PRIVATE HEALTH INSURANCE | Attending: Pulmonary Disease | Primary: Internal Medicine

## 2021-11-12 ENCOUNTER — Encounter: Admit: 2021-11-12 | Payer: PRIVATE HEALTH INSURANCE | Primary: Internal Medicine

## 2021-11-12 DIAGNOSIS — I4719 Atrial tachycardia: Secondary | ICD-10-CM

## 2021-11-12 MED ORDER — METOPROLOL SUCCINATE ER 25 MG TABLET,EXTENDED RELEASE 24 HR
25 mg | ORAL_TABLET | Freq: Every day | ORAL | 1 refills | Status: AC
Start: 2021-11-12 — End: 2022-03-13

## 2021-11-17 ENCOUNTER — Ambulatory Visit
Admit: 2021-11-17 | Payer: PRIVATE HEALTH INSURANCE | Attending: Interventional Pain Medicine | Primary: Internal Medicine

## 2021-11-17 ENCOUNTER — Inpatient Hospital Stay
Admit: 2021-11-17 | Discharge: 2021-11-17 | Payer: PRIVATE HEALTH INSURANCE | Attending: Pulmonary Disease | Primary: Internal Medicine

## 2021-11-17 ENCOUNTER — Encounter: Admit: 2021-11-17 | Payer: PRIVATE HEALTH INSURANCE | Attending: Pulmonary Disease | Primary: Internal Medicine

## 2021-11-17 DIAGNOSIS — I889 Nonspecific lymphadenitis, unspecified: Secondary | ICD-10-CM

## 2021-11-17 DIAGNOSIS — I129 Hypertensive chronic kidney disease with stage 1 through stage 4 chronic kidney disease, or unspecified chronic kidney disease: Secondary | ICD-10-CM

## 2021-11-17 DIAGNOSIS — I340 Nonrheumatic mitral (valve) insufficiency: Secondary | ICD-10-CM

## 2021-11-17 DIAGNOSIS — N189 Chronic kidney disease, unspecified: Secondary | ICD-10-CM

## 2021-11-17 DIAGNOSIS — Z888 Allergy status to other drugs, medicaments and biological substances status: Secondary | ICD-10-CM

## 2021-11-17 DIAGNOSIS — Z880 Allergy status to penicillin: Secondary | ICD-10-CM

## 2021-11-17 DIAGNOSIS — Z881 Allergy status to other antibiotic agents status: Secondary | ICD-10-CM

## 2021-11-17 DIAGNOSIS — Z882 Allergy status to sulfonamides status: Secondary | ICD-10-CM

## 2021-11-17 DIAGNOSIS — Z8719 Personal history of other diseases of the digestive system: Secondary | ICD-10-CM

## 2021-11-17 DIAGNOSIS — Z79899 Other long term (current) drug therapy: Secondary | ICD-10-CM

## 2021-11-17 DIAGNOSIS — R591 Generalized enlarged lymph nodes: Secondary | ICD-10-CM

## 2021-11-17 DIAGNOSIS — K219 Gastro-esophageal reflux disease without esophagitis: Secondary | ICD-10-CM

## 2021-11-17 DIAGNOSIS — D573 Sickle-cell trait: Secondary | ICD-10-CM

## 2021-11-17 DIAGNOSIS — Q613 Polycystic kidney, unspecified: Secondary | ICD-10-CM

## 2021-11-17 DIAGNOSIS — I10 Essential (primary) hypertension: Secondary | ICD-10-CM

## 2021-11-17 DIAGNOSIS — Z9049 Acquired absence of other specified parts of digestive tract: Secondary | ICD-10-CM

## 2021-11-17 DIAGNOSIS — E559 Vitamin D deficiency, unspecified: Secondary | ICD-10-CM

## 2021-11-17 DIAGNOSIS — K200 Eosinophilic esophagitis: Secondary | ICD-10-CM

## 2021-11-17 DIAGNOSIS — Z90710 Acquired absence of both cervix and uterus: Secondary | ICD-10-CM

## 2021-11-17 DIAGNOSIS — R002 Palpitations: Secondary | ICD-10-CM

## 2021-11-17 LAB — ZZZBODY FLUID CELL COUNT     (YH)
BKR CWS RED BLOOD CELLS, FLUID: 27 {cells}/uL — ABNORMAL HIGH
BKR NUCLEATED CELLS FLUID: 263 {cells}/uL

## 2021-11-17 LAB — BODY FLUID CELL COUNT     (BH GH LMW YH)

## 2021-11-17 LAB — BODY FLUID DIFF     (YH)

## 2021-11-17 MED ORDER — LACTATED RINGERS INTRAVENOUS SOLUTION
INTRAVENOUS | Status: DC | PRN
Start: 2021-11-17 — End: 2021-11-17
  Administered 2021-11-17: 14:00:00 via INTRAVENOUS

## 2021-11-17 MED ORDER — FENTANYL (PF) 50 MCG/ML INJECTION SOLUTION
50 mcg/mL | Status: CP
Start: 2021-11-17 — End: ?

## 2021-11-17 MED ORDER — LIDOCAINE (PF) 10 MG/ML (1 %) INJECTION SOLUTION
101 mg/mL (1 %) | Freq: Once | TOPICAL | Status: DC
Start: 2021-11-17 — End: 2021-11-17

## 2021-11-17 MED ORDER — SODIUM CHLORIDE 0.9 % INTRAVENOUS SOLUTION
INTRAVENOUS | Status: DC
Start: 2021-11-17 — End: 2021-11-17
  Administered 2021-11-17: 14:00:00 via INTRAVENOUS

## 2021-11-17 MED ORDER — PROPOFOL 10 MG/ML INTRAVENOUS EMULSION
10 mg/mL | INTRAVENOUS | Status: DC | PRN
Start: 2021-11-17 — End: 2021-11-17
  Administered 2021-11-17 (×3): 10 mg/mL via INTRAVENOUS

## 2021-11-17 MED ORDER — PROPOFOL 10 MG/ML INTRAVENOUS EMULSION
10 mg/mL | INTRAVENOUS | Status: DC | PRN
Start: 2021-11-17 — End: 2021-11-17
  Administered 2021-11-17: 14:00:00 10 mL/h via INTRAVENOUS

## 2021-11-17 MED ORDER — SODIUM CHLORIDE 0.9 % (FLUSH) INJECTION SYRINGE
0.9 % | INTRAVENOUS | Status: DC | PRN
Start: 2021-11-17 — End: 2021-11-17

## 2021-11-17 MED ORDER — SODIUM CHLORIDE 0.9 % INTRAVENOUS SOLUTION
INTRAVENOUS | Status: DC
Start: 2021-11-17 — End: 2021-11-17

## 2021-11-17 MED ORDER — ONDANSETRON HCL (PF) 4 MG/2 ML INJECTION SOLUTION
4 mg/2 mL | INTRAVENOUS | Status: DC | PRN
Start: 2021-11-17 — End: 2021-11-17
  Administered 2021-11-17: 14:00:00 4 mg/2 mL via INTRAVENOUS

## 2021-11-17 MED ORDER — LIDOCAINE (PF) 20 MG/ML (2 %) INTRAVENOUS SOLUTION
20 mg/mL (2 %) | INTRAVENOUS | Status: DC | PRN
Start: 2021-11-17 — End: 2021-11-17
  Administered 2021-11-17 (×2): 20 mg/mL (2 %) via INTRAVENOUS

## 2021-11-17 MED ORDER — LIDOCAINE HCL 10 MG/ML (1 %) INJECTION SOLUTION
10 mg/mL (1 %) | Status: DC | PRN
Start: 2021-11-17 — End: 2021-11-17
  Administered 2021-11-17: 14:00:00 10 mg/mL (1 %)

## 2021-11-17 MED ORDER — FENTANYL (PF) 50 MCG/ML INJECTION SOLUTION
50 mcg/mL | INTRAVENOUS | Status: DC | PRN
Start: 2021-11-17 — End: 2021-11-17
  Administered 2021-11-17 (×2): 50 mcg/mL via INTRAVENOUS

## 2021-11-17 MED ORDER — SODIUM CHLORIDE 0.9 % (FLUSH) INJECTION SYRINGE
0.9 % | Freq: Three times a day (TID) | INTRAVENOUS | Status: DC
Start: 2021-11-17 — End: 2021-11-17

## 2021-11-17 NOTE — Other
D/c criteria met from [facility]. Family at bedside. D/c instructions reviewed with pt and family and in understanding. MD in to see pt. IV d/c and site WNL. Gauze applied to site. Pt. No c/o pain, n/v, or dizziness. Pt. Changing independently. Pt. Wheeled  off unit w/ all belongings.

## 2021-11-17 NOTE — Other
[facility] HealthInterventional Pulmonary Surgically Focused H&PPatient Name: Cathy Evans of Birth: [birth year]Surgical Date: 11/17/21 Procedure: bronchoscopyDiagnosis/Indication: mediastinal lymphadenopathyPast Medical History:Past Medical History: Diagnosis Date ? Chronic kidney disease   PKD, but normal creatinine ? Eosinophilic esophagitis 08/26/2020 ? GERD (gastroesophageal reflux disease)  ? Hypertension  ? Mitral valve regurgitation  ? Palpitations  ? Polycystic kidney disease  ? Sickle cell trait (HC Code) (HC CODE) (HC Code)   Natera testing 2021 ? Vitamin D deficiency  Medications:No current facility-administered medications for this encounter.Current Outpatient Medications: ?  famotidine (PEPCID) 20 mg tablet, Take 1 tablet (20 mg total) by mouth 2 (two) times daily., Disp: 30 tablet, Rfl: 0?  lisinopriL (PRINIVIL,ZESTRIL) 5 mg tablet, Take 1 tablet (5 mg total) by mouth daily., Disp: 90 tablet, Rfl: 3?  metoprolol succinate XL (TOPROL-XL) 25 mg 24 hr tablet, Take 1 tablet (25 mg total) by mouth daily. Take with or immediately following a meal., Disp: 90 tablet, Rfl: 0Allergies:Cathy Evans, Cathy Evans (sulfonamide antibiotics), Cathy Evans, and SulfasalazinePhysical Exam:There were no vitals filed for this visit.HEENT: mmmLungs: ctabCor: rrrExt: wwpImaging: Assessment/Plan:Cathy Evans is a 56 y.o. year old female who presents for evaluation of mediastinal lymphadenopathy. Will proceed with linear ebus bronchoscopy.Procedure explained, time for questions provided, consent signed.  Cathy Segel Brahmandam7/02/2022 6:16 PMPlease contact via the team Interventional Pulmonary dynamic role in [facility]

## 2021-11-17 NOTE — Discharge Instructions
Thank you very much for allowing me to see you today.  I appreciate the opportunity to participate in your care.You received sedation today for your procedure.  Do not drive, consume alcohol, make legal decisions, operate heavy machinery or perform strenuous activity today.  You may, however, resume your regular diet today. You may resume your regular activities tomorrow.  You may also resume your regular medications today.  No changes were made to your medications.  If you were asked to hold any medications (coumadin, plavix or any other blood thinners) prior to the procedure, you may restart that medication tonight.You may have a cough that contains blood for a day or two after the procedure.  The blood will typically be mixed with sputum and less than a handful.  You may also have a fever or chills.  If you have a fever, you may take Tylenol or Ibuprofen at home (unless you typically are not allowed to take these medications).  This should resolve in 48 hours.Results may take up to a week to be finalized. Please note that some results come back sooner than others. Results will be available in MyChart. However, please plan to discuss the results either via an office visit or a phone call with the doctor's office that referred you to have this procedure. Any results will be promptly forwarded to your doctors. Contact information: For routine questions or concerns after the procedure please call [phone] urgent messages after-hours, please call the hospital operator at [phone] emergencies after the procedure, you should call 911.  Any questions regarding test results should be directed towards your primary doctor or referring doctor's office Again, thank you for allowing me to see you today.Marcene Brawn, MDAssistant ProfessorInterventional PulmonologyYale UAL Corporation of MedicineYale-[facility]

## 2021-11-17 NOTE — Anesthesia Pre-Procedure Evaluation
This is a 56 y.o. female scheduled for BRONCH EBUS SAMPLNG 3/> NODE.Review of Systems/ Medical HistoryPatient summary, nursing notes, EKG/Cardiac Studies , Labs, pre-procedure vitals, height, weight and NPO status reviewed.No previous anesthesia concernsAnesthesia Evaluation: Estimated body mass index is 28.73 kg/m? as calculated from the following:  Height as of 11/02/21: 5' 5 (1.651 m).  Weight as of this encounter: 78.3 kg. CC/HPI: 56 y.o. female who does not have a family history of?PKD.??Her father developed end-stage renal disease and underwent a kidney transplant, but she thinks that may have been due to diabetic kidney disease. ?She has a history of both kidney and liver cysts and hypertension, but has never had significant loss of renal function. She previously had anemia, but this was due to iron deficiency, and has resolved completely. 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  We think the PKHD1 is likely causative.?Overall she's doing fine, is dealing with her Autoimmune issues and follows closely with rheum and other specialities. Renal function has been stable ?Past Medical HistoryMerashe Evans has a past medical history of Chronic kidney disease, Eosinophilic esophagitis (08/26/2020), GERD (gastroesophageal reflux disease), Hypertension, Mitral valve regurgitation, Palpitations, Polycystic kidney disease, Sickle cell trait (HC Code) (HC CODE) (HC Code), and Vitamin D deficiency.?Past Surgical History:  TUBAL LIGATION	LEEPDILATION AND CURETTAGE OF UTERUS	LAPAROSCOPYTONSILLECTOMY	HYSTERECTOMYAPPENDECTOMY	COLONOSCOPYUPPER GASTROINTESTINAL ENDOSCOPY	Cardiovascular:Patient has a history of: hypertension. -Valvular disease: history of valvular problems/murmurs mitral regurgitation -Other Cardiovascular:  EKG 02/2022Sinus rhythm:Confirmed on behalf of NO EDIT ED READ On 07-04-2020 7:28:29 EST by [facility] 07/2020* Normal left ventricular size, wall thickness, systolic function and wall motion. LVEF calculated by 3DE was 69%.  Diastolic function was difficult to determine due to mitral valve abnormalities.* Normal right ventricular cavity size and systolic function.  Right ventricular systolic pressure is unable to be estimated due to insufficient Doppler signal.* Atria are normal in size.  No interatrial shunt by color Doppler.  Interatrial septum is bowed toward the left, consistent with elevated right atrial pressure.* Normal mitral valve leaflets.  Moderate posteriorly directed mitral regurgitation. By PISA, EROA PISA is 0.24 cm2 and regurgitant volume 46 ml.* All visible segments of the aorta are normal in size.* IVC diameter < 2.1 cm that collapses > 50% with a sniff suggests normal RAP (0-5 mmHg, mean 3 mmHg).* No significant pericardial effusion.* No prior study available for comparison.. Gastrointestinal/Genitourinary: -Renal Disorders:  Patient has renal insufficiency- CKD. -Comments: Polycystic Kidney DiseaseEosinophilic EsophagitisCT abdomen/pelvis with contrast:?Indication:  Extensive thoracic lymphadenopathy; Extending scan to abdomen/pelvis to assess for potential etiologies ?TECHNIQUE: [place] of the abdomen and pelvis was performed after intravenous administration of contrast.?Comparison is made with prior [place] from 04/27/2018.??FINDINGS:The liver, gallbladder, spleen, pancreas, and adrenal glands are unremarkable, except for tiny hepatic cysts. ?Again seen are multiple bilateral renal cysts and hypodensities too small to characterize, including a 2.9 cm lesion in the right kidney that likely represents a cyst with new hemorrhagic/proteinaceous debris (image 39, series 2) that has increased from 2.0 cm.?There is no bowel obstruction, ascites, or lymphadenopathy.?No aggressive osseous lesions are seen.?IMPRESSION:No definite malignancy identified. ?Bilateral renal cysts and lesions which cannot be characterized on this single phase [place], including increased in size 2.9 cm right renal lesion which can be definitively characterized with MRI.?Reported And Signed By: Morene Rankins, MD Hematological/Lymphatic: -Coagulopathy:  Patient has blood dyscrasia (Sickle Cell Trait).Physical ExamCardiovascular:    Rhythm: regularHeart Sounds: S1 present and S2 present.Pulmonary:  Patient's breath sounds clear to auscultationAirway:  Mallampati: IITM distance: >3 FBNeck ROM: fullMouth Opening: >3cmDental:  unremarkable  Anesthesia PlanASA 3 The primary anesthesia plan is  general. Perioperative Code Status confirmed: It is my understanding that the patient is currently designated as 'Full Code' and will remain so throughout the perioperative period.Anesthesia informed consent obtained. Consent obtained from: patientThe post operative pain plan is IV analgesics.Opioid administration likely.Plan discussed with Attending and CRNA.Anesthesiologist's Pre Op NoteI personally evaluated and examined the patient prior to the intra-operative phase of care on the day of the procedure.Marland Kitchen

## 2021-11-17 NOTE — Other
Admitted to room 3. Time out performed. Sedation and LMA given by Anesthesia/ CRNA . Airway exam performed. Lido to airways per Dr Corky Crafts. BAL RML collected.  EBBX Right Main Stem obtained. EBUS guided FNA lymph nodes: 4R, 10R, and 4L. RPMI collected and sent to appropriate lab. No bleeding seen in airways at end of exam. Pt tolerated procedure well. Pt transferred to RR and report given to RN.

## 2021-11-17 NOTE — Anesthesia Post-Procedure Evaluation
Anesthesia Post-op NotePatient: Cathy ObidagoProcedure(s):  Procedure(s) (LRB):BRONCH EBUS SAMPLNG 3/> NODE (N/A) Patient location: PACULast Vitals:  I have noted the vital signs as listed in the nursing notes.Mental status recovered: patient participates in evaluation: YesVital signs reviewed: YesRespiratory function stable:YesAirway is patent: YesCardiovascular function and hydration status stable: YesPain control satisfactory: YesNausea and vomiting control satisfactory:YesThere were no known notable events for this encounter.

## 2021-11-17 NOTE — Other
Operative Diagnosis:Pre-op:   * No pre-op diagnosis entered * Patient Coded Diagnosis   Pre-op diagnosis: Lung nodule  Post-op diagnosis: Lung nodule  Patient Diagnosis   None    Post-op diagnosis:   * Lung nodule [Z61.1]Operative Procedure(s) :Procedure(s) (LRB):BRONCH EBUS SAMPLNG 3/> NODE (N/A)Post-op Procedure & Diagnosis ConfirmationPost-op Diagnosis: Post-op Diagnosis updated (see notes)     - Lung nodule (R91.1)Post-op Procedure: Post-op Procedure updated (see notes)     - BRONCH EBUS SAMPLNG 3/> NODE

## 2021-11-17 NOTE — Other
Post Anesthesia Transfer of Care NotePatient: Janira ObidagoProcedure(s) Performed: Procedure(s) (LRB):BRONCH EBUS SAMPLNG 3/> NODE (N/A) Patient location: PACU Last Vitals: Vitals Value Taken Time BP 123/65 11/17/21 1053 Temp 36 ?C 11/17/21 1053 Pulse 67 11/17/21 1053 Resp 18 11/17/21 1053 SpO2 100 % 11/17/21 1053 Level of consciousness: awakeTransport Vital Signs:  Stable since the last set of recorded intra-operative vital signsIntra-operative Complications: noneIntra-operative Intake & Output and Antibiotics as per Anesthesia record and discussed with the RN.

## 2021-11-17 NOTE — Other
[facility] Interventional PulmonologyBronchoscopy NoteMerashe [phone] Date of procedure: 7/11/2023Attending: Marcene Brawn, MDIndication: LADProcedure(s) Performed:Flexible bronchoscopyBALEBBXEBUSAfter informed consent was obtained, a time out for patient safety was performed per protocol.  The patient was induced by the anesthesia team.The scope was advanced through the airway.  Aerosolized 1% lidocaine was used to anesthetize the vocal cords, carina and bilateral hila.  The scope was then advanced to the left and right tracheobronchial trees.  The airways were normal to the level of subsegmental bronchi.  A bronchoalveolar lavage was performed using a total of 120 ml of normal saline.  The specimens were sent for analysis.  The BAL was performed in the RML. Endobronchial biopsies were done of normal mucosa in RMS. The convex probe endobronchial ultrasound (EBUS) scope was advanced through the vocal cords. Complete EBUS scanning of the mediastinum and bilateral hila revealed enlarged 10R, 4R,   4L  nodes.  Sub-77mm nodes were seen in the remaining stations.   After identifying the targets, a 21g needle was used to perform TBNA with EBUS-guidance. The needle was passed through the target 20-30 times. This was repeated three times per station.  No further diagnostic or therapeutic procedures were indicated and the procedure was concluded.  No immediate complications were noted.  Images from the procedure were saved into Provation and are available for review.The procedure was performed by myself with the assistance of Peter Minium, MDErin Corky Crafts, MD Assistant ProfessorInterventional Pulmonology; Division of Pulmonary, Critical Care and Sleep MedicineOffice:  [phone] contact via the team Interventional Pulmonary dynamic role in [facility]

## 2021-11-18 LAB — BAL PANEL
BKR CD19+: 0.8 %
BKR CD19+KAPPA+: 0.6 %
BKR CD19+LAMBDA+: 0.3 %
BKR CD3+: 93 %
BKR CD3+CD4+: 57.7 %
BKR CD3+CD8+: 38.4 %
BKR CD4/CD8 RATIO: 1.5

## 2021-11-18 LAB — FLOW CYTOMETRY - LEUKEMIA/LYMPHOMA/NEOPLASIA, TISSUE/FLUID

## 2021-11-18 LAB — VIABILITY (LAB ONLY) (YH): BKR VIABILITY: 20.6 %

## 2021-11-19 LAB — FNA PANEL     (YH)
BKR CD19+: 24.4 %
BKR CD19+CD10+: 0.1 %
BKR CD19+CD20+: 23.9 %
BKR CD19+CD5+: 1.3 %
BKR CD19+KAPPA+: 67.9 %
BKR CD19+LAMBDA+: 36.1 %
BKR CD20+: 26 %
BKR CD3+: 65.4 %
BKR CD3+CD4+: 52.3 %
BKR CD3+CD4+CD8+: 0.4 %
BKR CD3+CD8+: 12.5 %
BKR CD3-CD16/56+: 2.4 %
BKR CD4/CD8 RATIO: 4.18

## 2021-11-19 LAB — FLOW CYTOMETRY - LEUKEMIA/LYMPHOMA/NEOPLASIA, TISSUE/FLUID

## 2021-11-19 LAB — LOWER RESP CULTURE, QUANT
BKR GRAM STAIN (ROUTINE): NONE SEEN
BKR LOWER RESPIRATORY CULTURE: NORMAL

## 2021-11-19 LAB — VIABILITY (LAB ONLY) (YH): BKR VIABILITY: 88.1 %

## 2021-11-23 NOTE — Progress Notes
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 female with PMHx notable for HTN, polycystic kidney disease, who presents for initial visit. She was referred by her gastroenterologist Dr. Vesta Mixer for concern of positive ANA, found as part of evaluation for dysphagia. She has underwent EGD and colonoscopy fall 2019 but notes symptoms persist despite initiation of PPI. She reports fatigue, hair thinning, episode of oral ulcers, along with arthralgia and myalgia, pregnancy loss at 3 months and possible issue with her platelets in the past. 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 Pt encouraged to use MyChart to let us know if she has any questions or concerns.Interval Change:--Seen 12/2018, we discussed how her presentation may be suggestive of an evolving UCTD for which we discussed starting HCQ (AEs discussed) to which she agreed. She will start HCQ 200 mg daily and schedule Ophthalmology visit with [facility]. She will return for followup in 3 months or earlier PRN.--Seen 05/22/2019, on HCQ 200 mg daily that was started after her last visit, noting overall improvement, particularly in her MSK symptoms. She still endorses sicca, hair thinning, and fatigue; she describes some mild hand symptoms that are suggestive of inflammatory arthritis. We discussed continuing to monitor on HCQ 200 mg daily--she is up to date on Ophthalmology evaluation and has VF testing next week. We will see her for followup in 3-4 months.  --Seen 08/28/2019, on HCQ 200 mg daily, noting some MSK symptoms that are seem more mechanical in etiology. She also continues to have hair thinning, dry mouth, and some dysphagia. Her last GI visit was in early 2020 after endoscopies in 2019; I encouraged her to followup with GI prior to considering barium swallow study. At this time, we discussed scheduling an in-person visit and will plan to repeat labs before her followup in 3-4 months. 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 Today:She returns today off HCQ 200 mg daily that was self-DCd 1 year before because she did not want to take additional medications. Unable to say if there is difference off HCQ. She is taking metoprolol for palpitations. She recently had parotitis seen by PCP, with imaging that showed right parotitis but could not tolerate antibiotics due to AE. She is using hot compresses. She has followup Friday. She was also found to have thoracic adenopathy 6/11, now plan is for [place] abdomen/pelvis and Oncology evaluation.She has ongoing fatigue, feels exhausted. No unexplained weight loss, fever/chills. She has been having vision changes, with dry eyes, using allergy drops. She has dry mouth, some dysphagia. She has no SOB, chest pain. She has lower abdominal pain described as cramping, also with lower back pain. She has crampy pain in her legs. She had some cramping pain in her toes. She has not had Raynaud's, rashes, oral ulcers. She has hair thinning.Review of Systems (negative except as noted; positive findings bold)Constitutional: weight changes, fatigue, malaise, fever, chills, sweatsSkin: rashes, photosensitivity, hives, easy bruisability, alopecia, scalp tenderness, skin nodules or psoriasis Eyes:  pain, redness, itching, visual blurring, dryness, foreign body sensationENT:  tinnitus, hearing loss, sinus congestion, loss of smell, dry nose, bloody nose, jaw claudication, oral ulcers, loss of taste, dry mouth, hoarsenessCardiac: chest pain, palpitations, irregular heartbeat, exertional dyspneaVascular: Raynaud?s, chilblains, frostbite, venous stasis, thrombosisPulmonary: shortness of breath, cough, wheeze, hemoptysis, chest wall pain, pleuritic chest pain, current tobacco use GI: difficulty swallowing, nausea, vomiting, GERD, ulcers, constipation, diarrhea, change in bowel habits, abdominal pain, liver diseaseGU: genital ulcers, dysuria, blood in urine, nocturia, infections, kidney stonesMusculoskeletal: morning stiffness, neck pain, back pain, joint pain, joint swelling, muscle aching or tenderness, muscle weaknessNeurological: numbness, tingling, headaches, fainting, dizziness, imbalance, memory loss, seizure, strokeHeme/Lymph: swollen or tender glands, anemiaPsychiatric:  anxiety, irritability, depression, sleep disturbancePast Medical History:Past Medical History: Diagnosis Date ? Chronic kidney disease   PKD, but normal creatinine ? Eosinophilic esophagitis 08/26/2020 ? GERD (gastroesophageal reflux disease)  ? Hypertension  ? Mitral valve regurgitation  ? Palpitations  ? Polycystic kidney disease  ? Sickle cell trait (HC Code) (HC CODE) (HC Code)   Natera testing 2021 ? Vitamin D deficiency  Past Surgical History: Procedure Laterality Date ? APPENDECTOMY  1984 ? COLONOSCOPY   ? DILATION AND CURETTAGE OF UTERUS   ? HYSTERECTOMY  08/30/2017 ? LAPAROSCOPY   ? LEEP   ? TONSILLECTOMY   ? TUBAL LIGATION   ? UPPER GASTROINTESTINAL ENDOSCOPY   Allergy:Allergies Allergen Reactions ? Penicillins Hives   Hives Has patient had a PCN reaction causing immediate rash, facial/tongue/throat swelling, SOB or lightheadedness with hypotension: YESHas patient had a PCN reaction causing severe rash involving mucus membranes or skin necrosis: NOHas patient had a PCN reaction that required hospitalization NOHas patient had a PCN reaction occurring within the last 10 years: NOIf all of the above answers are NO, then may proceed with Cephalosporin use. ? Sulfa (Sulfonamide Antibiotics) Hives and Rash ? Moxifloxacin Rash ? 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 diphenhydrAMINE Take 1 capsule (25 mg total) by mouth every 6 (six) hours as needed for itching (rash) for up to 8 days. ? metoprolol succinate XL Take 1 tablet (25 mg total) by mouth daily. Take with or immediately following a meal. ? triamcinolone Apply topically 2 (two) times daily for 7 days. Apply to your body. Do NOT apply to your face or genitals ? famotidine Take 1 tablet (20 mg total) by mouth 2 (two) times daily. ? lisinopriL Take 1 tablet (5 mg total) by mouth daily. No current facility-administered medications for this visit. Physical ExaminationVitals: BP 133/88  - Pulse 82  - Temp 97 ?F (36.1 ?C)  - Ht 5' 5 (1.651 m)  - Wt 79.4 kg  - LMP 08/30/2017 (Exact Date) Comment: hyst 08/30/2017 - SpO2 98%  - BMI 29.12 kg/m? General: female patient in no acute distressHEENT: no scleral icterus; no conjunctival injections; +fullness over right parotid area with some fullness and tenderness, no xerostomia; no oral lesions or exudates Neck: supple; no cervical or supraclavicular lymphadenopathyCVS: regular rate and rhythm and S1, S2 Respiratory: clear to auscultation bilaterallyAbdomen: non-distendedExtremities: no edemaMSK:upper and lower extremity joints are normal without synovitis, enthesitis, dactylitis, or limitation in ROMBack: no tenderness to palpation over vertebraeSkin: skin color, texture, turgor normal; no rashes or lesions; warm and dryNeuro: alert and oriented; motor strength symmetrical proximally and distally in upper and lower extremitiesPsychiatric: mood/affect appropriate; well-kempt Lab and Imaging ResultsLab Results Component Value Date  WBC 4.9 10/09/2021  HGB 12.9 10/09/2021  HCT 42.0 10/09/2021  MCV 87.0 10/09/2021  PLT 325 10/09/2021 Lab Results Component Value Date  CREATININE 0.6 10/09/2021  BUN 9 06/26/2021  NA 137 06/26/2021  K 4.5 10/09/2021  CL 101 06/26/2021  CO2 25 06/26/2021 Lab Results Component Value Date  ALT 30 06/26/2021  AST 30 06/26/2021  ALKPHOS 96 06/26/2021  BILITOT 0.4 06/26/2021  12/2019ANA	1:1280 nuclear, multiple nuclear dotsdsDNA	Negative Scl-70 	Negative SSA	NegativeSSB	NegativeJo-1 	NegativeCCP 	<16RF IgM 	10ANCA	NegativeSm Ab	NegativeMito Ab	Negative C3	161C4	4212/19/2019 [place] abdomen pelvisThe liver, gallbladder, spleen, pancreas, and adrenal glands are unremarkable, except for tiny hepatic cysts. There are multiple bilateral renal cysts and hypodensities too small to characterize, including a 2.0 cm lesion in the right kidney that likely represents a cyst with new hemorrhagic/proteinaceous debris (image 35, series 2).?There is no bowel obstruction, ascites, or lymphadenopathy.?No aggressive osseous lesions are seen.IMPRESSION: No evidence of metastatic disease.SummaryMerashe Evans is a 56 y.o. 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 She was seen for initial visit via telehealth in 08/2018 and this is her first in-person visit with me. She was started on HCQ but returns today after she self-DCd one year prior. Recently, clinical course c/b parotitis and thoracic adenopathy, now undergoing [place] abdomen/pelvis and evaluation by Oncology. Mammogram and ultrasound notable for BI-RAD2. She also describes fatigue, sicca, dysphagia, cramping abdominal pain. At this time, we discussed awaiting possible biopsy for further elucidation of underlying process--we will touch base after workup for thoracic adenopathy. In the interim, we will repeat serologies. We will hold on resuming HCQ for now. RecommendationsLabs to monitor disease activity and medications: Orders Placed This Encounter Procedures ? C-reactive protein (CRP) ? Sedimentation rate (ESR) ? Rheumatoid factor ? Cyclic citrul peptide antibody, IgG ? ANA by IFA w/rflx to dsDNA, RNP, SM, SSA, and SSB Abs     ([facility]) ? C3 complement ? C4 complement     ([facility]) ? ANGIOTENSIN CONVERTING ENZYME ? Immunoglobulins IgG, IgA, IgM ? Immunoglobulin G subclass 4     ([facility] LM[facility] Q [facility]) ? Immunofixation, serum ? Protein electrophoresis, serum [facility]) Meds: Will decide after workupReferrals: Oncology as scheduledPatient education/counseling RE: CTD, sarcoid, IgG4-rdFollow up: The patient knows to call with any concerns before the next appointment. Next visit: 3 Lorelee Cover, MDYale Rheumatology [email or web address]/[email or web address]/2023

## 2021-11-26 ENCOUNTER — Encounter: Admit: 2021-11-26 | Payer: PRIVATE HEALTH INSURANCE | Attending: Critical Care Medicine | Primary: Internal Medicine

## 2021-11-26 ENCOUNTER — Encounter: Admit: 2021-11-26 | Payer: PRIVATE HEALTH INSURANCE | Primary: Internal Medicine

## 2021-11-26 DIAGNOSIS — J849 Interstitial pulmonary disease, unspecified: Secondary | ICD-10-CM

## 2021-12-03 ENCOUNTER — Encounter: Admit: 2021-12-03 | Payer: PRIVATE HEALTH INSURANCE | Attending: Rheumatology | Primary: Internal Medicine

## 2021-12-03 ENCOUNTER — Telehealth: Admit: 2021-12-03 | Payer: PRIVATE HEALTH INSURANCE | Primary: Internal Medicine

## 2021-12-03 DIAGNOSIS — R590 Localized enlarged lymph nodes: Secondary | ICD-10-CM

## 2021-12-03 DIAGNOSIS — D869 Sarcoidosis, unspecified: Secondary | ICD-10-CM

## 2021-12-03 MED ORDER — HYDROXYCHLOROQUINE 200 MG TABLET
200 mg | ORAL_TABLET | Freq: Every day | ORAL | 1 refills | Status: AC
Start: 2021-12-03 — End: ?

## 2021-12-03 NOTE — Telephone Encounter
Fax came to office from Utility company request for Medical hardship forms to be completed. Please advise if approved and writer will start forms and place in folder to complete/sign.Marylene Land RN

## 2021-12-03 NOTE — Patient Instructions
Lung doctor as scheduledReschedule cardiac PET Restart Plaquenil 200 mg daily

## 2021-12-04 ENCOUNTER — Encounter: Admit: 2021-12-04 | Payer: PRIVATE HEALTH INSURANCE | Primary: Internal Medicine

## 2021-12-04 NOTE — Telephone Encounter
Sent mychart message Diplomatic Services operational officer

## 2021-12-07 ENCOUNTER — Inpatient Hospital Stay: Admit: 2021-12-07 | Discharge: 2021-12-07 | Payer: PRIVATE HEALTH INSURANCE | Primary: Internal Medicine

## 2021-12-15 ENCOUNTER — Inpatient Hospital Stay: Admit: 2021-12-15 | Discharge: 2021-12-15 | Payer: PRIVATE HEALTH INSURANCE | Primary: Internal Medicine

## 2021-12-15 DIAGNOSIS — I10 Essential (primary) hypertension: Secondary | ICD-10-CM

## 2021-12-15 DIAGNOSIS — Z111 Encounter for screening for respiratory tuberculosis: Secondary | ICD-10-CM

## 2021-12-15 LAB — BASIC METABOLIC PANEL
BKR ANION GAP: 11 (ref 7–17)
BKR BLOOD UREA NITROGEN: 8 mg/dL (ref 6–20)
BKR BUN / CREAT RATIO: 13.1 (ref 8.0–23.0)
BKR CALCIUM: 10.2 mg/dL (ref 8.8–10.2)
BKR CHLORIDE: 102 mmol/L (ref 98–107)
BKR CO2: 26 mmol/L (ref 20–30)
BKR CREATININE: 0.61 mg/dL (ref 0.40–1.30)
BKR EGFR, CREATININE (CKD-EPI 2021): >60 mL/min/{1.73_m2} (ref >=60)
BKR GLUCOSE: 93 mg/dL (ref 70–100)
BKR POTASSIUM: 4.3 mmol/L (ref 3.3–5.3)
BKR SODIUM: 139 mmol/L (ref 136–144)

## 2021-12-16 LAB — QUANTIFERON-TB
BKR QUANTIFERON-TB GOLD IN-TUBE: NEGATIVE
BKR QUANTIFERON-TB MITOGEN MINUS NIL: 9.93 [IU]/mL
BKR QUANTIFERON-TB NIL: 0.07 IU/mL (ref 98–107)
BKR QUANTIFERON-TB1 MINUS NIL: 0.04 IU/mL (ref <0.35)
BKR QUANTIFERON-TB2 MINUS NIL: 0.03 [IU]/mL (ref <0.35)

## 2021-12-17 ENCOUNTER — Telehealth
Admit: 2021-12-17 | Payer: PRIVATE HEALTH INSURANCE | Attending: Student in an Organized Health Care Education/Training Program | Primary: Internal Medicine

## 2021-12-17 LAB — FUNGAL CULTURE
BKR FUNGAL CULTURE: NO GROWTH
BKR FUNGAL PERIODIC ACID SCHIFF: NEGATIVE

## 2021-12-17 NOTE — Telephone Encounter
Called patient regarding bronchoscopy results. Per chart review, she has already received the diagnosis of sarcoidosis. She has follow up established with rheumatology and pulmonary in [facility] to help guide treatment. Voicemail left. Instructed to call back with any questions.Peter Minium, MD PGYVIIInterventional Pulmonology

## 2021-12-24 NOTE — Progress Notes
VIDEO TELEHEALTH VISIT: This clinician is part of the telehealth program and is conducting this visit in a currently approved location. For this visit the clinician and patient were present via interactive audio & video telecommunications system that permits real-time communications, via the [employer].Patient's use of the telehealth platform followed consent and acknowledges agreement to permit telehealth for this visit. State patient is located in: [place]The clinician is appropriately licensed in the above state to provide care for this visit. Other individuals present during the telehealth encounter and their role/relation: noneBecause this visit was completed over video, a hands-on physical exam was not performed. Patient/parent or guardian understands and knows to call back if condition changes.[place] RheumatologyDiagnosis: Undifferentiated Connective Tissue Disease Serologies: +ANA 1:2560 nuclear dots; negative Sm, RNP, SSA, SSB, dsDNALA not detected, B2GP, ACL Ab negativeCCP negative, RF negativeUrine Prot/Cr ratio 0.06C3 140, C4 : alopecia, arthralgias, dry eyes, dry mouthCurrent therapy: Hydroxychloroquine 200 mg BID--stopped 1 year agoHPI from 08/2018:Cathy Evans presents for initial visit, never saw a rheumatologist in the past.Cathy Evans is a 56 y.o. female with PMHx notable for HTN, polycystic kidney disease, who presents for initial visit. She was referred by her gastroenterologist Dr. Vesta Mixer for concern of positive ANA, found as part of evaluation for dysphagia. She has underwent EGD and colonoscopy fall 2019 but notes symptoms persist despite initiation of PPI. She reports fatigue, hair thinning, episode of oral ulcers, along with arthralgia and myalgia, pregnancy loss at 3 months and possible issue with her platelets in the past. 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 Pt encouraged to use MyChart to let us know if she has any questions or concerns.Interval Change:--Seen 12/2018, we discussed how her presentation may be suggestive of an evolving UCTD for which we discussed starting HCQ (AEs discussed) to which she agreed. She will start HCQ 200 mg daily and schedule Ophthalmology visit with [facility]. She will return for followup in 3 months or earlier PRN.--Seen 05/22/2019, on HCQ 200 mg daily that was started after her last visit, noting overall improvement, particularly in her MSK symptoms. She still endorses sicca, hair thinning, and fatigue; she describes some mild hand symptoms that are suggestive of inflammatory arthritis. We discussed continuing to monitor on HCQ 200 mg daily--she is up to date on Ophthalmology evaluation and has VF testing next week. We will see her for followup in 3-4 months.  --Seen 08/28/2019, on HCQ 200 mg daily, noting some MSK symptoms that are seem more mechanical in etiology. She also continues to have hair thinning, dry mouth, and some dysphagia. Her last GI visit was in early 2020 after endoscopies in 2019; I encouraged her to followup with GI prior to considering barium swallow study. At this time, we discussed scheduling an in-person visit and will plan to repeat labs before her followup in 3-4 months. 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 --Seen 11/23/2021, for first in-person visit with me. She was started on HCQ but returns today after she self-DCd one year prior. Recently, clinical course c/b parotitis and thoracic adenopathy, now undergoing  abdomen/pelvis and evaluation by Oncology. Mammogram and ultrasound notable for BI-RAD2. She also describes fatigue, sicca, dysphagia, cramping abdominal pain. At this time, we discussed awaiting possible biopsy for further elucidation of underlying process--we will touch base after workup for thoracic adenopathy. In the interim, we will repeat serologies. We  will hold on resuming HCQ for now. Today:She underwent EBUS 11/17/2021 with pathology notable for focal non-caseating granulomatous process. She is having some SOB, referred to see Pulmonology.Some LE discomfort. No rashes. Feels increased fatigue, jittery at times. Notes palpitations, seen by Cardiology in 2022 s/p 2decho and monitor. Also going to see Ophthalmology. Review of Systems (negative except as noted; positive findings bold)Constitutional: weight changes, fatigue, malaise, fever, chills, sweatsSkin: rashes, photosensitivity, hives, easy bruisability, alopecia, scalp tenderness, skin nodules or psoriasis, hair thinningEyes:  pain, redness, itching, visual blurring, dryness, foreign body sensationENT: tinnitus, hearing loss, sinus congestion, loss of smell, dry nose, bloody nose, jaw claudication, oral ulcers, loss of taste, dry mouth, hoarsenessCardiac: chest pain, palpitations, irregular heartbeat, exertional dyspneaVascular: Raynaud?s, chilblains, frostbite, venous stasis, thrombosisPulmonary: shortness of breath, cough, wheeze, hemoptysis, chest wall pain, pleuritic chest pain, current tobacco use GI: difficulty swallowing, nausea, vomiting, GERD, ulcers, constipation, diarrhea, change in bowel habits, abdominal pain, liver diseaseGU: genital ulcers, dysuria, blood in urine, nocturia, infections, kidney stonesMusculoskeletal: morning stiffness, neck pain, back pain, joint pain, joint swelling, muscle aching or tenderness, muscle weaknessNeurological: numbness, tingling, headaches, fainting, dizziness, imbalance, memory loss, seizure, strokeHeme/Lymph: swollen or tender glands, anemiaPsychiatric:  anxiety, irritability, depression, sleep disturbancePast Medical History:Past Medical History: Diagnosis Date ? Chronic kidney disease   PKD, but normal creatinine ? Eosinophilic esophagitis 08/26/2020 ? GERD (gastroesophageal reflux disease)  ? Hypertension  ? Mitral valve regurgitation  ? Palpitations  ? Polycystic kidney disease  ? Sickle cell trait (HC Code) (HC CODE) (HC Code)   Natera testing 2021 ? Vitamin D deficiency  Past Surgical History: Procedure Laterality Date ? APPENDECTOMY  1984 ? COLONOSCOPY   ? DILATION AND CURETTAGE OF UTERUS   ? HYSTERECTOMY  08/30/2017 ? LAPAROSCOPY   ? LEEP   ? TONSILLECTOMY   ? TUBAL LIGATION   ? UPPER GASTROINTESTINAL ENDOSCOPY   Allergy:Allergies Allergen Reactions ? Penicillins Hives   Hives Has patient had a PCN reaction causing immediate rash, facial/tongue/throat swelling, SOB or lightheadedness with hypotension: YESHas patient had a PCN reaction causing severe rash involving mucus membranes or skin necrosis: NOHas patient had a PCN reaction that required hospitalization NOHas patient had a PCN reaction occurring within the last 10 years: NOIf all of the above answers are NO, then may proceed with Cephalosporin use. ? Sulfa (Sulfonamide Antibiotics) Hives and Rash ? Moxifloxacin Rash ? 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 lisinopriL Take 1 tablet (5 mg total) by mouth daily. ? metoprolol succinate XL Take 1 tablet (25 mg total) by mouth daily. Take with or immediately following a meal. No current facility-administered medications for this visit. Lab and Imaging ResultsLab Results Component Value Date  WBC 4.9 10/09/2021  HGB 12.9 10/09/2021  HCT 42.0 10/09/2021  MCV 87.0 10/09/2021  PLT 325 10/09/2021 Lab Results Component Value Date  CREATININE 0.6 10/09/2021  BUN 9 06/26/2021  NA 137 06/26/2021  K 4.5 10/09/2021  CL 101 06/26/2021  CO2 25 06/26/2021 Lab Results Component Value Date  ALT 30 06/26/2021  AST 30 06/26/2021  ALKPHOS 96 06/26/2021  BILITOT 0.4 06/26/2021  12/2019ANA	1:1280 nuclear, multiple nuclear dotsdsDNA	Negative Scl-70 	Negative SSA	NegativeSSB	NegativeJo-1 	NegativeCCP 	<16RF IgM 	10ANCA	NegativeSm Ab	NegativeMito Ab	Negative C3	161C4	4212/19/2019 Cathy Evans abdomen pelvisThe liver, gallbladder, spleen, pancreas, and adrenal glands are unremarkable, except for tiny hepatic cysts. There are multiple bilateral renal cysts and hypodensities  too small to characterize, including a 2.0 cm lesion in the right kidney that likely represents a cyst with new hemorrhagic/proteinaceous debris (image 35, series 2).?There is no bowel obstruction, ascites, or lymphadenopathy.?No aggressive osseous lesions are seen.IMPRESSION: No evidence of metastatic disease.SummaryMerashe Evans is a 56 y.o. 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 She was seen for initial visit via telehealth in 08/2018, presented for initial in-person visit with me 7/17, now s/p EBUS for LAD, found to have granulomatous changes, scheduled to see Pulmonology. She underwent repeat serologies notable for +ANA 1:2560 nuclear dots, 1:160 speckled, with negative ENAs, normal ACE, C3 and C4. At this time, we discussed how the granulomatous changes on pathology are concerning for sarcoid and would recommend further evaluation with PFTs (ordered) and by Pulmonology to determine if treatment is necessary, prior to consideration of a course of steroids. She also will see Ophthalmology and we will order PET cardiac sarcoid for further evaluation. In the interim, we will restart HCQ for her UCTD.Recommendations:UCTDLabs to monitor disease activity and medications: at next visitRestart HCQ 200 mg dailyGranulomatous changes on pathologyPulmonary evaluation, PFTsPET cardiac sarcoidOphthalmology evaluation Patient education/counseling RE: CTD, sarcoidFollow up: The patient knows to call with any concerns before the next appointment. Next visit: 2 Cathy Evans, MDYale Rheumatology [email or web address]/27/[email or web address]

## 2021-12-24 NOTE — Progress Notes
Review of Systems Eyes:      Dry, constantly feels like somethings in her eye All other systems reviewed and are negative.

## 2021-12-29 LAB — AFB CULTURE     (BH GH LMW YH)
BKR AFB CULTURE: NO GROWTH
BKR AFB STAIN: NONE SEEN

## 2022-01-01 ENCOUNTER — Inpatient Hospital Stay: Admit: 2022-01-01 | Payer: PRIVATE HEALTH INSURANCE | Primary: Internal Medicine

## 2022-01-01 ENCOUNTER — Ambulatory Visit: Admit: 2022-01-01 | Payer: PRIVATE HEALTH INSURANCE | Primary: Internal Medicine

## 2022-01-19 ENCOUNTER — Inpatient Hospital Stay: Admit: 2022-01-19 | Payer: PRIVATE HEALTH INSURANCE | Primary: Internal Medicine

## 2022-01-19 ENCOUNTER — Ambulatory Visit: Admit: 2022-01-19 | Payer: PRIVATE HEALTH INSURANCE | Primary: Internal Medicine

## 2022-01-21 ENCOUNTER — Encounter
Admit: 2022-01-21 | Payer: PRIVATE HEALTH INSURANCE | Attending: Student in an Organized Health Care Education/Training Program | Primary: Internal Medicine

## 2022-01-21 ENCOUNTER — Ambulatory Visit
Admit: 2022-01-21 | Payer: PRIVATE HEALTH INSURANCE | Attending: Student in an Organized Health Care Education/Training Program | Primary: Internal Medicine

## 2022-01-21 ENCOUNTER — Inpatient Hospital Stay: Admit: 2022-01-21 | Discharge: 2022-01-21 | Payer: PRIVATE HEALTH INSURANCE | Primary: Internal Medicine

## 2022-01-21 DIAGNOSIS — E559 Vitamin D deficiency, unspecified: Secondary | ICD-10-CM

## 2022-01-21 DIAGNOSIS — K200 Eosinophilic esophagitis: Secondary | ICD-10-CM

## 2022-01-21 DIAGNOSIS — N189 Chronic kidney disease, unspecified: Secondary | ICD-10-CM

## 2022-01-21 DIAGNOSIS — R0602 Shortness of breath: Secondary | ICD-10-CM

## 2022-01-21 DIAGNOSIS — D869 Sarcoidosis, unspecified: Secondary | ICD-10-CM

## 2022-01-21 DIAGNOSIS — J849 Interstitial pulmonary disease, unspecified: Secondary | ICD-10-CM

## 2022-01-21 DIAGNOSIS — R002 Palpitations: Secondary | ICD-10-CM

## 2022-01-21 DIAGNOSIS — Q613 Polycystic kidney, unspecified: Secondary | ICD-10-CM

## 2022-01-21 DIAGNOSIS — D573 Sickle-cell trait: Secondary | ICD-10-CM

## 2022-01-21 DIAGNOSIS — K219 Gastro-esophageal reflux disease without esophagitis: Secondary | ICD-10-CM

## 2022-01-21 DIAGNOSIS — I340 Nonrheumatic mitral (valve) insufficiency: Secondary | ICD-10-CM

## 2022-01-21 DIAGNOSIS — I10 Essential (primary) hypertension: Secondary | ICD-10-CM

## 2022-01-21 LAB — CBC WITH AUTO DIFFERENTIAL
BKR ANION GAP: 33.6 g/dL (ref 31.0–36.0)
BKR WAM ABSOLUTE IMMATURE GRANULOCYTES.: 0.01 x 1000/??L (ref 0.00–0.30)
BKR WAM ABSOLUTE LYMPHOCYTE COUNT.: 2.01 x 1000/??L (ref 0.60–3.70)
BKR WAM ABSOLUTE NRBC (2 DEC): 0 x 1000/??L (ref 0.00–1.00)
BKR WAM ANALYZER ANC: 2.28 x 1000/ÂµL (ref 2.00–7.60)
BKR WAM BASOPHIL ABSOLUTE COUNT.: 0.05 x 1000/??L (ref 0.00–1.00)
BKR WAM BASOPHILS: 1 % (ref 0.0–1.4)
BKR WAM EOSINOPHIL ABSOLUTE COUNT.: 0.38 x 1000/??L (ref 0.00–1.00)
BKR WAM EOSINOPHILS: 7.3 % — ABNORMAL HIGH (ref 0.0–5.0)
BKR WAM HEMATOCRIT (2 DEC): 39.6 % (ref 35.00–45.00)
BKR WAM HEMOGLOBIN: 13.3 g/dL (ref 11.7–15.5)
BKR WAM IMMATURE GRANULOCYTES: 0.2 % (ref 0.0–1.0)
BKR WAM LYMPHOCYTES: 38.8 % (ref 17.0–50.0)
BKR WAM MCH (PG): 29 pg (ref 27.0–33.0)
BKR WAM MCHC: 33.6 g/dL (ref 31.0–36.0)
BKR WAM MCV: 86.5 fL (ref 80.0–100.0)
BKR WAM MONOCYTE ABSOLUTE COUNT.: 0.45 x 1000/??L (ref 0.00–1.00)
BKR WAM MONOCYTES: 8.7 % (ref 4.0–12.0)
BKR WAM MPV: 9.7 fL (ref 8.0–12.0)
BKR WAM NEUTROPHILS: 44 % (ref 39.0–72.0)
BKR WAM NUCLEATED RED BLOOD CELLS: 0 % (ref 0.0–1.0)
BKR WAM PLATELETS: 282 x1000/??L (ref 150–420)
BKR WAM RDW-CV: 13.3 % (ref 11.0–15.0)
BKR WAM RED BLOOD CELL COUNT.: 4.58 M/??L (ref 4.00–6.00)
BKR WAM WHITE BLOOD CELL COUNT: 5.2 x1000/??L (ref 4.0–11.0)

## 2022-01-21 LAB — COMPREHENSIVE METABOLIC PANEL
BKR A/G RATIO: 1.4 (ref 1.0–2.2)
BKR ALANINE AMINOTRANSFERASE (ALT): 26 U/L (ref 10–35)
BKR ALBUMIN: 4.6 g/dL (ref 3.6–4.9)
BKR ALKALINE PHOSPHATASE: 94 U/L (ref 9–122)
BKR ASPARTATE AMINOTRANSFERASE (AST): 30 U/L (ref 10–35)
BKR AST/ALT RATIO: 1.2 x 1000/??L (ref 0.00–1.00)
BKR BILIRUBIN TOTAL: 0.4 mg/dL (ref <=1.2)
BKR BLOOD UREA NITROGEN: 7 mg/dL (ref 6–20)
BKR BUN / CREAT RATIO: 11.1 % (ref 8.0–23.0)
BKR CALCIUM: 10.1 mg/dL (ref 8.8–10.2)
BKR CHLORIDE: 103 mmol/L (ref 98–107)
BKR CO2: 29 mmol/L (ref 20–30)
BKR CREATININE: 0.63 mg/dL (ref 0.40–1.30)
BKR EGFR, CREATININE (CKD-EPI 2021): >60 mL/min/{1.73_m2} (ref >=60)
BKR GLOBULIN: 3.3 g/dL (ref 2.3–3.5)
BKR GLUCOSE: 93 mg/dL (ref 70–100)
BKR POTASSIUM: 4.4 mmol/L (ref 3.3–5.3)
BKR PROTEIN TOTAL: 7.9 g/dL (ref 6.6–8.7)
BKR SODIUM: 141 mmol/L (ref 136–144)

## 2022-01-21 LAB — VITAMIN D, 25-HYDROXY: BKR VITAMIN D 25-HYDROXY TOTAL: 23 ng/mL

## 2022-01-21 NOTE — Progress Notes
[facility] Department of Internal Medicine Section of Pulmonary, Critical Care, and Sleep Medicine	[facility] [address]  [place], [place] [address]UEAVW:  [phone]  Fax: [phone], Glo Herring, MD, PhD				Medical Director, Milas Hock, MD			Associate Director, Stark Klein, MD, [facility] Ginnie Smart, MD, MScJean Rhina Brackett, MDGenta Camillia Herter, MD, MPHChangwan Elizbeth Squires, MD, MPHJonathan Siner, MDLisa Rebmann, RNChristine Tancreti, RNPatient Name:  Arilene Metters of Birth:  1967/04/16Date of Service: 9/14/2023EPIC MRN:  [id number] Provider:  Elroy Channel, MDDear Dr. Rosaria Ferries had the pleasure of seeing [facility], referred by you in consultation for shortness of breath with history of UCTD, PKD, and suspected sarcoidosisHISTORY OF THE PRESENT ILLNESS:Vernadette Frischman is a 56 y.o. female who presents for evaluation of sarcoidosis. She was found to have mediastinal adenopathies on [place] chest and neck as part of the workup for a neck mass. She underwent an EBUS/bronchoscopy and path showed non caseating granulomas. Today, her symptoms consist of dry eyes, dry mouth, dysphagia, palpitations, abdominal cramping, tingling in hands, blurry vision, dry cough, hematochezia, severe fatigue, rash and itchiness in her upper back and lower abdomen. She also reports experiencing shortness of breath with occasional onset from exhaustion or feelings of upset. She sees Cardiology for her palpitations, she has been prescribed metoprolol which she takes but she does not feel that the palpitations subside.  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 She was previously on plaquenil but stopped it a year ago as she didn't benefit from it. Saw Dr Annye Rusk in June who recommended restarting it. CONNECTIVE TISSUE DISEASE SYMPTOMS:Dry eye:  occasionalDry mouth:  yesSkin rash:  Lower abdomen and lower backPhotosensitivity:  NoSkin thickening:  NoDrying/cracking of hands/fingers:  NoArthralgias:  Bilateral knee pain and wrist painMorning stiffness: yes, takes approximately 20 minutes to resolveMyalgias: in the morning, takes approximately 20 minutes to resolveProximal muscle weakness:  NoRaynaud's:  NoReflux/regurgitation: when she eats certain foods, she notices more palpitations. When she burps she feels palpitations. Different kind of palpitation from her normal ones. Dysphagia:  YesREVIEW OF SYSTEMS:Review of Systems Constitutional: Positive for malaise/fatigue. HENT:      Positive for dysphagia. Eyes:      Positive for occasional dry eyes. Respiratory: Positive for cough (dry) and shortness of breath (intermittent in severity, onset due to exhaustion or feelings of upset).  Cardiovascular: Positive for palpitations (usually connected to shortness of breath). Gastrointestinal: Positive for blood in stool (01/20/2022). Musculoskeletal:      Positive for abdominal cramping, lump behind ear (resolved).  Skin: Positive for itching (upper back, lower abdomen) and rash (upper back, lower abdomen). Neurological: Positive for tingling (hands). Endo/Heme/Allergies: Bruises/bleeds easily (knee, bumped against bench ). The remainder of the review of systems is negative in detail.MEDICATION HISTORY:Amiodarone:   noNitrofurantoin:   noOther/OTC/Herbal:   noCURRENT MEDICATIONS:Current Outpatient Medications: ?  hydroxychloroquine, Take 1 tablet (200 mg total) by mouth daily.?  lisinopriL, Take 1 tablet (5 mg total) by mouth daily.?  metoprolol succinate XL, Take 1 tablet (25 mg total) by mouth daily. Take with or immediately following a meal.ALLERGIES:Allergies Allergen Reactions ? Penicillins Hives   Hives Has patient had a PCN reaction causing immediate rash, facial/tongue/throat swelling, SOB or lightheadedness with hypotension: YESHas patient had a PCN reaction causing severe rash involving mucus membranes or skin necrosis: NOHas patient had a PCN reaction that required hospitalization  NOHas patient had a PCN reaction occurring within the last 10 years: NOIf all of the above answers are NO, then may proceed with Cephalosporin use. ? Sulfa (Sulfonamide Antibiotics) Hives and Rash ? Moxifloxacin Rash ? Sulfasalazine    rash PAST MEDICAL HISTORY:Past Medical History: Diagnosis Date ? Chronic kidney disease   PKD, but normal creatinine ? Eosinophilic esophagitis 08/26/2020 ? GERD (gastroesophageal reflux disease)  ? Hypertension  ? Mitral valve regurgitation  ? Palpitations  ? Polycystic kidney disease  ? Sickle cell trait (HC Code) (HC CODE) (HC Code)   Natera testing 2021 ? Vitamin D deficiency  Past Surgical History: Procedure Laterality Date ? APPENDECTOMY  1984 ? COLONOSCOPY   ? DILATION AND CURETTAGE OF UTERUS   ? HYSTERECTOMY  08/30/2017 ? LAPAROSCOPY   ? LEEP   ? TONSILLECTOMY   ? TUBAL LIGATION   ? UPPER GASTROINTESTINAL ENDOSCOPY   FAMILY HISTORY:Lung disease: noneLiver disease: noneConnective tissue disease: noneAnemias/leukemias: noneLung cancer: noneOther: non-PKD related kidney transplant (father), liver cancer, colon cancer, prostate cancer, ovarian cancer (aunt), kidney cysts (daughter) SOCIAL HISTORY:Smoking: never smokedAlcohol: occasionalDrugs: NoneMarried: NoChildren: 3 children, 5 grandkids all healthy OCCUPATIONAL AND ENVIRONMENTAL HISTORY:Occupational history: [employer]Asbestos: unsurePets: noneBird exposure: noneFeather products: noMold exposure: found mold in daughter's basement (she visits every day)HEALTH MAINTENANCE:Immunization History Administered Date(s) Administered ? TB Screening (PPD/Quantiferon) 11/04/2014 PHYSICAL EXAMINATION:BP 130/86 (Site: l a, Position: Sitting, Cuff Size: Medium)  - Pulse 65  - Temp 97.6 ?F (36.4 ?C) (Temporal)  - Resp 20  - Ht 5' 4.37 (1.635 m)  - Wt 77.1 kg  - LMP 08/30/2017 (Exact Date) Comment: hyst 08/30/2017 - SpO2 99% Comment: r/a - BMI 28.84 kg/m? Physical ExamConstitutional:     Appearance: Normal appearance. HENT:    Head: Normocephalic.    Mouth/Throat:    Mouth: Mucous membranes are moist.    Pharynx: Oropharynx is clear. No oropharyngeal exudate or posterior oropharyngeal erythema. Cardiovascular:    Rate and Rhythm: Normal rate and regular rhythm.    Pulses: Normal pulses. Pulmonary:    Effort: Pulmonary effort is normal. No respiratory distress.    Breath sounds: No stridor. No wheezing. Lymphadenopathy:    Cervical: No cervical adenopathy. Skin:   Capillary Refill: Capillary refill takes less than 2 seconds.    Findings: Bruising (knee, due to bumping against bench) present.    Comments: Lump on calf. Hyperpigmentation on lower abdomen.  Neurological:    General: No focal deficit present.    Mental Status: She is alert and oriented to person, place, and time. Psychiatric:       Mood and Affect: Mood normal.       Behavior: Behavior normal. REVIEW OF DATA:Pulmonary Function Testing:Date FVC FEV1 FEV1/FVC TLC DLCO 01/21/2022 2.46 (73%) 1.96 (73%) 80% 3.44 (66%) 15.95 (78%) Personal Interpretation:  No obstruction, mild restriction, normal DLCOChest Radiology:10/18/21 [place] ChestLungs/Airways/Pleura: Central tracheobronchial tree is patent. Minimal tree-in-bud and groundglass opacities in the right lower lobe, likely infectious or inflammatory etiology (4:367). No reticulations, bronchiectasis, honeycombing, architectural distortion or other evidence of interstitial lung disease. No suspicious pulmonary nodules or masses. No pleural effusion or pneumothorax.?Mediastinum/Lymph nodes: Left subclavicular, superior mediastinal, right paratracheal, prevascular, bilateral hilar and subcarinal adenopathy. Largest nodes measure up to 14 mm in the subcarinal region.?Other Radiology/Studies: AP 6/23/23The liver, gallbladder, spleen, pancreas, and adrenal glands are unremarkable, except for tiny hepatic cysts. ?Again seen are multiple bilateral renal cysts and hypodensities too small to characterize, including a 2.9 cm lesion in the right kidney that likely represents  a cyst with new hemorrhagic/proteinaceous debris (image 39, series 2) that has increased from 2.0 cm.?There is no bowel obstruction, ascites, or lymphadenopathy.?No aggressive osseous lesions are seen.Pathology:7/11/23Lung, right mainstem, endobronchial biopsy: ? ? ? - Superficial fragments of bronchial mucosa with no significant abnormality. FNA lung1. ?RIGHT MIDDLE LOBE BRONCHOALVEOLAR LAVAGE: ? ? ?- NEGATIVE FOR MALIGNANT CELLS. ? ? ?- REACTIVE PULMONARY MACROPHAGES. ? ? ?- GMS STAIN IS NEGATIVE FOR FUNGUS/PJP. ? ? ?- AFB STAIN IS NEGATIVE FOR MYCOBACTERIA. 2. ?LYMPH NODE, 4R, FINE NEEDLE ASPIRATION/WASH: ? ? ?- NEGATIVE FOR MALIGNANT CELLS. ? ? ? ? ? - REACTIVE LYMPHOCYTES AND FOCAL LYMPHOHISTIOCYTIC AGGREGATES. 3. ?LYMPH NODE, 10R, FINE NEEDLE ASPIRATION/WASH: ? ? ?- NEGATIVE FOR MALIGNANT CELLS. ? ? ? ? ? - REACTIVE LYMPHOCYTES AND FOCAL LYMPHOHISTIOCYTIC AGGREGATES. 4. ?LYMPH NODE, 4L, FINE NEEDLE ASPIRATION/WASH: ? ? ?- NEGATIVE FOR MALIGNANT CELLS. ? ? ?- FOCAL NON-CASEATING GRANULOMATOUS INFLAMMATION. ? ? ?- GMS STAIN IS NEGATIVE FOR FUNGUS/PJP. ? ? ?- AFB STAIN IS NEGATIVE FOR MYCOBACTERIA.Cardiac studies:07/11/20 * Normal left ventricular size, wall thickness, systolic function and wall motion. LVEF calculated by 3DE was 69%.  Diastolic function was difficult to determine due to mitral valve abnormalities.* Normal right ventricular cavity size and systolic function.  Right ventricular systolic pressure is unable to be estimated due to insufficient Doppler signal.* Atria are normal in size.  No interatrial shunt by color Doppler.  Interatrial septum is bowed toward the left, consistent with elevated right atrial pressure.* Normal mitral valve leaflets.  Moderate posteriorly directed mitral regurgitation. By PISA, EROA PISA is 0.24 cm2 and regurgitant volume 46 ml.* All visible segments of the aorta are normal in size.* IVC diameter < 2.1 cm that collapses > 50% with a sniff suggests normal RAP (0-5 mmHg, mean 3 mmHg).* No significant pericardial effusion.* No prior study available for comparison.Labs:10/2021:CRP 5.1, ESR 34Neg ACE, RFNormal complementIgG: 1625, remainder unremarkableANA > 1:2560 (nuclear dots) 1:160 speckled. DsDNA, RNP, SSA, SSB, Smith, all negative?12/2019ANA?????1:1280 nuclear, multiple nuclear dotsdsDNA?Negative Scl-70 ?Negative SSA?????NegativeSSB?????NegativeJo-1 ????NegativeCCP ????<16RF IgM ???????????10ANCA??NegativeSm Ab?NegativeMito Ab????????????Negative C3????????161C4????????42IMPRESSION:Amala Lanius is a 56 y.o. 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 On [place] scan she has no evidence of parenchymal involvement from sarcoidosis.  She is evidence of restriction on pulmonary function tests and I believe these could be due to neuromuscular weakness given that her parenchymal findings are quite minimal.Mostly, I am concerned about her palpitations.  These are happening more often and sometimes she recalls that she has felt like she was going to pass out from them.  She is scheduled to have PET scan to further evaluate her cardiac tissue, but they should be referred to a sarcoid specialist in Cardiology.RECOMMENDATIONS:SarcoidosisPathology: EBUS with FNA showing non caseating granulomasOrgan involvement: LADs, possibly heartPrimary target organ for therapy: none currentlyCurrent treatment: nonePast treatments: noneFurther work-up:?	CBC CMP including liver function tests, vitamin-D and calcium.?	Referral to Cardiology for further evaluation of palpitations.  She will have a scan completed by then.She is already been referred to ophthalmology.UCTDNo evidence of CTD ILD on [place] scan.  I believe she should be further worked up for neuromuscular weakness including a myositis panel which I will send.Please do not hesitate to call me if you would like to discuss Ms. Carper care.Sincerely,Lillie Portner Ginnie Smart, MD, MScInstructorYale Interstitial Lung Disease CenterSection of Pulmonary and Critical Care MedicineYale School of MedicineOn the day  of this patient's encounter, a total of 71 minutes was personally spent by me, which includes time spent on chart and records review, review of radiological images, medical consultation, education, coordination of care/services , counseling and chart documentation.This does not include any resident/fellow teaching time, or any time spent performing a procedural service.Scribed for Cerro-Chiang, Feliciana Rossetti, MD by Elsie Saas, medical scribe January 21, 2022  The documentation recorded by the scribe accurately reflects the services I personally performed and the decisions made by me. I reviewed and confirmed all material entered and/or pre-charted by the scribe.

## 2022-01-21 NOTE — Patient Instructions
It was a pleasure to meet you in clinic today.  Today we discussed your diagnosis of sarcoidosis The immediate plan includes the following studies and/or referrals:BloodworkReferral to cardiologyI will discuss with your rheumatologist about treatment options.Please keep me updated on any changes in your breathing and any acute illnesses, particularly if hospitalization is required. Our goal at the [facility] is to provide you comprehensive care and support from diagnosis onward.[facility]: [phone]

## 2022-01-26 ENCOUNTER — Encounter: Admit: 2022-01-26 | Payer: PRIVATE HEALTH INSURANCE | Attending: Rheumatology | Primary: Internal Medicine

## 2022-01-29 ENCOUNTER — Inpatient Hospital Stay: Admit: 2022-01-29 | Discharge: 2022-01-29 | Payer: PRIVATE HEALTH INSURANCE | Primary: Internal Medicine

## 2022-01-29 DIAGNOSIS — Q613 Polycystic kidney, unspecified: Secondary | ICD-10-CM

## 2022-01-29 DIAGNOSIS — R590 Localized enlarged lymph nodes: Secondary | ICD-10-CM

## 2022-01-29 MED ORDER — GADOTERATE MEGLUMINE 0.5 MMOL/ML (376.9 MG/ML) INTRAVENOUS SOLUTION
0.5 mmol/mL (376.9 mg/mL) | Freq: Once | INTRAVENOUS | Status: CP | PRN
Start: 2022-01-29 — End: ?
  Administered 2022-01-29: 23:00:00 0.5 mL via INTRAVENOUS

## 2022-02-03 ENCOUNTER — Encounter
Admit: 2022-02-03 | Payer: PRIVATE HEALTH INSURANCE | Attending: Vascular and Interventional Radiology | Primary: Internal Medicine

## 2022-02-04 ENCOUNTER — Encounter: Admit: 2022-02-04 | Payer: PRIVATE HEALTH INSURANCE | Attending: Cardiovascular Disease | Primary: Internal Medicine

## 2022-02-05 ENCOUNTER — Inpatient Hospital Stay: Admit: 2022-02-05 | Payer: PRIVATE HEALTH INSURANCE | Primary: Internal Medicine

## 2022-02-05 ENCOUNTER — Inpatient Hospital Stay: Admit: 2022-02-05 | Discharge: 2022-02-05 | Payer: PRIVATE HEALTH INSURANCE | Primary: Internal Medicine

## 2022-02-05 DIAGNOSIS — D869 Sarcoidosis, unspecified: Secondary | ICD-10-CM

## 2022-02-05 DIAGNOSIS — R590 Localized enlarged lymph nodes: Secondary | ICD-10-CM

## 2022-02-08 ENCOUNTER — Telehealth: Admit: 2022-02-08 | Payer: PRIVATE HEALTH INSURANCE | Attending: Cardiovascular Disease | Primary: Internal Medicine

## 2022-02-08 NOTE — Telephone Encounter
Called patient to schedule follow up no ans left message to call office to schedule appointment thx

## 2022-02-11 ENCOUNTER — Encounter: Admit: 2022-02-11 | Payer: PRIVATE HEALTH INSURANCE | Attending: Rheumatology | Primary: Internal Medicine

## 2022-02-11 DIAGNOSIS — R590 Localized enlarged lymph nodes: Secondary | ICD-10-CM

## 2022-02-11 DIAGNOSIS — D869 Sarcoidosis, unspecified: Secondary | ICD-10-CM

## 2022-02-11 NOTE — Patient Instructions
Followup in 3 monthsRestart Plaquenil 200 mg daily

## 2022-02-26 ENCOUNTER — Inpatient Hospital Stay: Admit: 2022-02-26 | Discharge: 2022-02-26 | Payer: PRIVATE HEALTH INSURANCE | Primary: Internal Medicine

## 2022-02-26 DIAGNOSIS — D869 Sarcoidosis, unspecified: Secondary | ICD-10-CM

## 2022-02-26 DIAGNOSIS — F419 Anxiety disorder, unspecified: Secondary | ICD-10-CM

## 2022-02-26 LAB — CBC WITH AUTO DIFFERENTIAL
BKR WAM ABSOLUTE IMMATURE GRANULOCYTES.: 0.01 x 1000/??L (ref 0.00–0.30)
BKR WAM ABSOLUTE LYMPHOCYTE COUNT.: 2.3 x 1000/ÂµL (ref 0.60–3.70)
BKR WAM ABSOLUTE NRBC (2 DEC): 0 x 1000/??L (ref 0.00–1.00)
BKR WAM ANALYZER ANC: 3 x 1000/??L (ref 2.00–7.60)
BKR WAM BASOPHIL ABSOLUTE COUNT.: 0.04 x 1000/??L (ref 0.00–1.00)
BKR WAM BASOPHILS: 0.7 % (ref 0.0–1.4)
BKR WAM EOSINOPHIL ABSOLUTE COUNT.: 0.18 x 1000/ÂµL (ref 0.00–1.00)
BKR WAM EOSINOPHILS: 3 % (ref 0.0–5.0)
BKR WAM HEMATOCRIT (2 DEC): 38.5 % (ref 35.00–45.00)
BKR WAM HEMOGLOBIN: 12.8 g/dL (ref 11.7–15.5)
BKR WAM IMMATURE GRANULOCYTES: 0.2 % (ref 0.0–1.0)
BKR WAM LYMPHOCYTES: 37.9 % (ref 17.0–50.0)
BKR WAM MCH (PG): 29.4 pg (ref 27.0–33.0)
BKR WAM MCHC: 33.2 g/dL (ref 31.0–36.0)
BKR WAM MCV: 88.5 fL (ref 80.0–100.0)
BKR WAM MONOCYTE ABSOLUTE COUNT.: 0.54 x 1000/??L (ref 0.00–1.00)
BKR WAM MONOCYTES: 8.9 % (ref 4.0–12.0)
BKR WAM MPV: 9.7 fL (ref 8.0–12.0)
BKR WAM NEUTROPHILS: 49.3 % (ref 39.0–72.0)
BKR WAM NUCLEATED RED BLOOD CELLS: 0 % (ref 0.0–1.0)
BKR WAM PLATELETS: 259 x1000/ÂµL (ref 150–420)
BKR WAM RDW-CV: 13.6 % — ABNORMAL HIGH (ref 11.0–15.0)
BKR WAM RED BLOOD CELL COUNT.: 4.35 M/??L (ref 4.00–6.00)
BKR WAM WHITE BLOOD CELL COUNT: 6.1 x1000/ÂµL (ref 4.0–11.0)

## 2022-02-27 LAB — CK     (BH GH L LMW YH): BKR CREATINE KINASE TOTAL: 74 U/L (ref 11–204)

## 2022-02-27 LAB — COMPREHENSIVE METABOLIC PANEL
BKR A/G RATIO: 1.4 (ref 1.0–2.2)
BKR ALANINE AMINOTRANSFERASE (ALT): 28 U/L (ref 10–35)
BKR ALBUMIN: 4.2 g/dL (ref 3.6–4.9)
BKR ALKALINE PHOSPHATASE: 91 U/L (ref 9–122)
BKR ANION GAP: 10 (ref 7–17)
BKR ASPARTATE AMINOTRANSFERASE (AST): 23 U/L (ref 10–35)
BKR AST/ALT RATIO: 0.8 x 1000/??L (ref 0.00–1.00)
BKR BILIRUBIN TOTAL: 0.2 mg/dL (ref 0.0–<=1.2)
BKR BLOOD UREA NITROGEN: 9 mg/dL (ref 6–20)
BKR BUN / CREAT RATIO: 16.7 (ref 8.0–23.0)
BKR CALCIUM: 9.4 mg/dL (ref 8.8–10.2)
BKR CHLORIDE: 105 mmol/L (ref 98–107)
BKR CO2: 26 mmol/L (ref 20–30)
BKR CREATININE: 0.54 mg/dL (ref 0.40–1.30)
BKR EGFR, CREATININE (CKD-EPI 2021): >60 mL/min/{1.73_m2} (ref >=60)
BKR GLOBULIN: 2.9 g/dL (ref 2.3–3.5)
BKR GLUCOSE: 105 mg/dL — ABNORMAL HIGH (ref 70–100)
BKR POTASSIUM: 4.3 mmol/L (ref 3.3–5.3)
BKR PROTEIN TOTAL: 7.1 g/dL (ref 6.6–8.7)
BKR SODIUM: 141 mmol/L (ref 136–144)

## 2022-02-27 LAB — TSH W/REFLEX TO FT4     (BH GH LMW Q YH): BKR THYROID STIMULATING HORMONE: 2.18 u[IU]/mL

## 2022-02-27 LAB — C-REACTIVE PROTEIN     (CRP): BKR C-REACTIVE PROTEIN, HIGH SENSITIVITY: 5.5 mg/L — ABNORMAL HIGH

## 2022-02-27 LAB — SEDIMENTATION RATE (ESR): BKR SEDIMENTATION RATE, ERYTHROCYTE: 47 mm/h — ABNORMAL HIGH (ref 0–20)

## 2022-03-01 NOTE — Other
Please inform patient that her thyroid test is normal.

## 2022-03-03 ENCOUNTER — Ambulatory Visit: Admit: 2022-03-03 | Payer: PRIVATE HEALTH INSURANCE | Attending: Cardiovascular Disease | Primary: Internal Medicine

## 2022-03-03 ENCOUNTER — Ambulatory Visit: Admit: 2022-03-03 | Payer: PRIVATE HEALTH INSURANCE | Primary: Internal Medicine

## 2022-03-03 ENCOUNTER — Encounter: Admit: 2022-03-03 | Payer: PRIVATE HEALTH INSURANCE | Attending: Cardiovascular Disease | Primary: Internal Medicine

## 2022-03-03 DIAGNOSIS — D573 Sickle-cell trait: Secondary | ICD-10-CM

## 2022-03-03 DIAGNOSIS — D869 Sarcoidosis, unspecified: Secondary | ICD-10-CM

## 2022-03-03 DIAGNOSIS — R002 Palpitations: Secondary | ICD-10-CM

## 2022-03-03 DIAGNOSIS — I10 Essential (primary) hypertension: Secondary | ICD-10-CM

## 2022-03-03 DIAGNOSIS — Q613 Polycystic kidney, unspecified: Secondary | ICD-10-CM

## 2022-03-03 DIAGNOSIS — E785 Hyperlipidemia, unspecified: Secondary | ICD-10-CM

## 2022-03-03 DIAGNOSIS — Z8249 Family history of ischemic heart disease and other diseases of the circulatory system: Secondary | ICD-10-CM

## 2022-03-03 DIAGNOSIS — E559 Vitamin D deficiency, unspecified: Secondary | ICD-10-CM

## 2022-03-03 DIAGNOSIS — K219 Gastro-esophageal reflux disease without esophagitis: Secondary | ICD-10-CM

## 2022-03-03 DIAGNOSIS — N189 Chronic kidney disease, unspecified: Secondary | ICD-10-CM

## 2022-03-03 DIAGNOSIS — I340 Nonrheumatic mitral (valve) insufficiency: Secondary | ICD-10-CM

## 2022-03-03 DIAGNOSIS — K200 Eosinophilic esophagitis: Secondary | ICD-10-CM

## 2022-03-03 NOTE — Progress Notes
Holter monitor [phone] applied to [facility]. Patient tolerated well with no issues. Education, indication, all equipment, and [employer] contact number provided to patient. Patient to wear monitor for 7 days per Dr.Gallegos Kattan. Notified patient that she is to contact 9-1-1 if/when experiencing symptoms of distress. Patient verbalized understanding and agreeable to proceed. No further questions or concerns at this time.

## 2022-03-03 NOTE — Progress Notes
[employer] of MedicineSection of Cardiovascular MedicineYale Cardiomyopathy ProgramReferring Provider:Cerro-Chiang MDSubjective: Chief Complaint:   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  She also has a diagnosis of undifferentiated connective tissue disease managed by Dr. Annye Rusk and untreated hyperlipidemia.Patient has been previously seen by Dr. Margarito Courser for management of hypertension and palpitations.  She had been on atenolol which she felt did not help.  She stopped this as well as the lisinopril.  She has never had syncope.  She reports daytime fatigue and possibly lower extremity edema though she has not today.  She had an ER visit for this last year and telemetry showed sinus arrhythmia.  She had an echocardiogram in March of 2022 showing normal biventricular size and function with LVEF of 69%, elevated right atrial pressure, moderate mitral regurgitation with posteriorly directed jet, and no significant pericardial effusion.  She had an event monitor in March of 2022, showing a short atrial run, PACs, and PVCs, less than 1%.  Eventually, she was transitioned to metoprolol succinate but she does not feel this has helped either.  She feels that her sensation of palpitations has changed since last year.  She feels that whenever she experiences nausea and belching, she has sensation of her heart fluttering.  Again, she remains without syncope or near-syncope.  She experiences musculoskeletal chest pain when she lifts things, but no exertional chest pain, pressure, or other anginal equivalents.  She reports she had I redness at some point.  She was already referred to ophthalmology.  She also had a maculopapular rash which has disappeared.  She was started on hydroxychloroquine recently but she has not started taking the medication yet.  An FDG cardiac PET was ordered by rheumatology.She denies smoking.  She occasionally drinks wine.  She denies any other drugs.  She has a family history of coronary artery disease and diabetes.  Her daughter is being worked up for polycystic kidney disease.  No family history of sarcoidosis, sudden cardiac death, or need for pacemakers.Electrocardiogram shows sinus rhythm at a heart rate of 69 beats per minute with no ST or T-wave changes suggestive of ischemia or arrhythmia and no conduction disease.Medical History: Reviewed and updated is the past medical history, past surgical history, and past social history.  They are as follows: PMH PSH  She    has a past medical history of Chronic kidney disease, Eosinophilic esophagitis (08/26/2020), GERD (gastroesophageal reflux disease), Hypertension, Mitral valve regurgitation, Palpitations, Polycystic kidney disease, Sickle cell trait (HC Code) (HC CODE) (HC Code), and Vitamin D deficiency. She  has a past surgical history that includes Tubal ligation; LEEP; Dilation and curettage of uterus; laparoscopy; Tonsillectomy; Hysterectomy (08/30/2017); Appendectomy (1984); Colonoscopy; and Upper gastrointestinal endoscopy. Social History Family History She  reports that she has never smoked. She has been exposed to tobacco smoke. She has never used smokeless tobacco. She reports that she does not currently use alcohol. She reports that she does not use drugs. Family History Problem Relation Age of Onset ? High cholesterol Mother  ? Hypertension Mother  ? Stroke Mother  ? Diabetes Father  ? Kidney disease Father  ? Hyperthyroidism Sister  ? Diabetes  Brother  ? Hypertension Brother  ? Diabetes Brother  ? Breast cancer Maternal Grandmother  ? Asthma Son  ? Ovarian cancer Maternal Aunt  ? Heart attack Paternal Uncle   Medications Reviewed and updated is the medication list:Current Medications Medication Sig ? hydrOXYzine (ATARAX) 25 mg tablet Take 1 tablet (25 mg total) by mouth 2 (two) times daily as needed for anxiety. ? metoprolol succinate XL (TOPROL-XL) 25 mg 24 hr tablet Take 1 tablet (25 mg total) by mouth daily. Take with or immediately following a meal.  Allergies She is allergic to penicillins, sulfa (sulfonamide antibiotics), sulfamethoxazole-trimethoprim, sulfasalazine, and moxifloxacin. Review of Systems: ROSThe remainder of the 12 point review of systems was reviewed with the patient and is negative.Objective: Vital Signs:LMP 08/30/2017 (Exact Date) Comment: hyst 08/30/2017 (  lbs.)Wt Readings from Last 3 Encounters: 02/11/22 77.1 kg 02/09/22 77.1 kg 02/05/22 76.7 kg  Physical Exam: General Appearance:  Alert, cooperative, no distress, appears stated age  Head:  Normocephalic, without obvious abnormality, atraumatic Neck: Supple, symmetrical, trachea midline; no carotid bruit or JVD. JVP appears normal with no abnormal wave forms, Respiratory:   Clear to auscultation bilaterally, respirations unlabored, No rales, wheezes or rhonchi  Cardiovascular:  PMI in 5th interspace MCL, normal S1 and S2, no murmurs, no rubs or gallops  Abdomen:   Soft, non-tender, bowel sounds active, no masses, no organomegaly, no pulsatile masses,  Extremities: No varicosities, no cyanosis or edema, no other lesions  Pulses: 2+ and symmetric all extremities,  pedal pulses 2+ bilaterally  Skin: Skin color, texture, turgor normal, no rashes or lesions  Neurologic/Psychiatric: Mood and affect appropriate, oriented to time/place and person Musculoskeletal: Gait normal  Assessment and Plan: Dr. Irena Reichmann you for the opportunity to see this patient in consultation at our [facility].  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  She also has a diagnosis of undifferentiated connective tissue disease managed by Dr. Annye Rusk.From the cardiac sarcoidosis perspective, she has no evidence of conduction disease, arrhythmia, or syncope/heart failure symptoms, which makes cardiac sarcoid involvement unlikely.  She does have both atrial and ventricular ectopy but this was less than 1%.  She does have moderate mitral regurgitation and a change in the pattern of her palpitations.  It is reassuring that her electrocardiogram remains without conduction disease but her symptoms might actually be related to her MR.  We will start by repeating an echocardiogram and obtaining a 7 day Holter monitor.  Unless the echocardiogram shows decreased LV function and or wall motion abnormalities, I do not think the FDG cardiac PET would be indicated at this time, but I have asked her to keep the appointment pending results of echocardiogram.  It is possible that we might need to pursue a cardiac MRI instead particularly in light of her moderate MR.  I will obtain an NT proBNP, interleukin-2 receptor levels with her next set of labs.  She will continue on metoprolol and we can consider up titrating pending results particularly as her blood pressure is slightly elevated.She has a personal history of hyperlipidemia and family history of premature coronary artery disease.  She does not have a lipid panel since 2019.  I have order a fasting lipid panel along with apolipoprotein B and lipoprotein a.  If elevated would recommend initiating statins.I  will contact her with results and plan going forward.  We will schedule a follow-up appointment with me in 2 to 3 months, sooner if changes or new symptoms.I encouraged patient to call me with questions or concerns.60 minutes of my time was spent during this evaluation.  This time comprised review of medical records, personal interpretation of ECG and/or diagnostic studies,meeting with the patient face to face for a lengthy interview, detailed physical exam, documentation of this visit, written instructions, coordination of care, and discussion with other clinicians as needed.Lucillie Garfinkel, MD MHSAssistant Professor of Medicine (Cardiology)[place] Cardiomyopathy ProgramYale [facility] and [facility] of MedicineSection of Cardiovascular MedicineNew Haven Office: [phone] Office: [phone] Lebron Conners Kattan10/25/2023 11:21 AM Electronically Signed by Damian Leavell, MD, March 03, 2022

## 2022-03-03 NOTE — Patient Instructions
[name],Echocardiogram, Holter monitor at your convenience.Keep FDG PET scheduled for now.Blood work at your conveWarehouse managering fasting cholesterol test.I will contact you with results and decide if we need to pursue cardiac MRII will see you again in 3 months or sooner, if changes. Please feel free to call me with questions and/or concerns.-Dr. Reece Agar

## 2022-03-04 ENCOUNTER — Inpatient Hospital Stay: Admit: 2022-03-04 | Discharge: 2022-03-04 | Payer: PRIVATE HEALTH INSURANCE | Primary: Internal Medicine

## 2022-03-04 DIAGNOSIS — R002 Palpitations: Secondary | ICD-10-CM

## 2022-03-08 ENCOUNTER — Encounter: Admit: 2022-03-08 | Payer: PRIVATE HEALTH INSURANCE | Attending: Rheumatology | Primary: Internal Medicine

## 2022-03-08 NOTE — Other
Results reviewedCK, aldolase normalESR elevated at 47

## 2022-03-08 NOTE — Progress Notes
The following is the transcribed Review of Systems, done by the patient at Intake, and attached to this is the attending physician's documentation.Review of Systems Constitutional: Negative.  HENT: Negative.  Eyes: Negative.  Respiratory: Positive for shortness of breath.  Cardiovascular: Positive for palpitations. Gastrointestinal: Positive for blood in stool. Genitourinary: Negative.  Musculoskeletal: Negative.  Skin: Negative.  Neurological: Positive for dizziness. Endo/Heme/Allergies: Positive for environmental allergies. Bruises/bleeds easily. Psychiatric/Behavioral: The patient is nervous/anxious.

## 2022-03-08 NOTE — Progress Notes
TELEPHONE VISIT: For this visit the clinician and patient were present via telephone (audio only).Previously; Patient counseled on available options for visit type; Patient elected telephone (audio only) visit; Patient consent given for telephone (audio only) visit: YesPatient Identity was confirmed during this call.  Other individuals actively participating in the telephone encounter and their name/relation to the patient: noneTotal time spent in medical telephone (audio only) visit: 20Because this visit was completed over telephone (audio only), a hands-on physical exam was not performed.  Patient understands and knows to call back if condition changes.[facility] RheumatologyDiagnosis: Undifferentiated Connective Tissue Disease Serologies: +ANA 1:2560 nuclear dots; negative Sm, RNP, SSA, SSB, dsDNALA not detected, B2GP, ACL Ab negativeCCP negative, RF negativeUrine Prot/Cr ratio 0.06C3 140, C4 : alopecia, arthralgias, dry eyes, dry mouthCurrent therapy: Hydroxychloroquine 200 mg BID--offHPI from 08/2018:Cathy Evans presents for initial visit, never saw a rheumatologist in the past.Cathy Evans is a 56 y.o. female with PMHx notable for HTN, polycystic kidney disease, who presents for initial visit. She was referred by her gastroenterologist Dr. Vesta Mixer for concern of positive ANA, found as part of evaluation for dysphagia. She has underwent EGD and colonoscopy fall 2019 but notes symptoms persist despite initiation of PPI. She reports fatigue, hair thinning, episode of oral ulcers, along with arthralgia and myalgia, pregnancy loss at 3 months and possible issue with her platelets in the past. 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 Pt encouraged to use MyChart to let us know if she has any questions or concerns.Interval Change:--Seen 12/2018, we discussed how her presentation may be suggestive of an evolving UCTD for which we discussed starting HCQ (AEs discussed) to which she agreed. She will start HCQ 200 mg daily and schedule Ophthalmology visit with [facility]. She will return for followup in 3 months or earlier PRN.--Seen 05/22/2019, on HCQ 200 mg daily that was started after her last visit, noting overall improvement, particularly in her MSK symptoms. She still endorses sicca, hair thinning, and fatigue; she describes some mild hand symptoms that are suggestive of inflammatory arthritis. We discussed continuing to monitor on HCQ 200 mg daily--she is up to date on Ophthalmology evaluation and has VF testing next week. We will see her for followup in 3-4 months.  --Seen 08/28/2019, on HCQ 200 mg daily, noting some MSK symptoms that are seem more mechanical in etiology. She also continues to have hair thinning, dry mouth, and some dysphagia. Her last GI visit was in early 2020 after endoscopies in 2019; I encouraged her to followup with GI prior to considering barium swallow study. At this time, we discussed scheduling an in-person visit and will plan to repeat labs before her followup in 3-4 months. 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 --Seen 12/03/2021, for her first in-person visit with me. She was started on HCQ but returns today after she self-DCd one year prior. Recently, clinical course c/b parotitis and thoracic adenopathy, now undergoing [facility] abdomen/pelvis and evaluation by Oncology. Mammogram and ultrasound notable for BI-RAD2. She also describes fatigue, sicca, dysphagia, cramping abdominal pain. At this time, we discussed awaiting possible biopsy for further elucidation of underlying process--we will touch base after workup for thoracic adenopathy. In the interim, we will repeat serologies. We will hold on resuming HCQ for now. Today:She is not taking HCQ. She underwent right middle lobe bronchoalveolar lavage, lymph node FNA, with right  middle lobe bronchoalveolar lavage that was negative malignant, lymph node FNA that showed reactive lymphocytes and focal lymphohistiocytic aggregate (4R and 10R), also focal non-caseating granulomatous inflammation (4L). She is referred to Pulmonary s/p visit on 9/14, referred to Cardiology for palpitations. She otherwise does not have parenchymal involvement from sarcoidosis. She was noted to have some restriction on PFTs, thought to be possibly from neuromuscular in etiology, myositis Ab panel ordered.She feels some SOB. She still has fatigue. She had a rash on her abdomen along with pruritis that improved. She has muscle pressure, feels symptoms in her legs, forearms. No trouble lifting her arms or swallowing. Review of Systems (negative except as noted; positive findings bold)Constitutional: weight changes, fatigue, malaise, fever, chills, sweatsSkin: rashes, photosensitivity, hives, easy bruisability, alopecia, scalp tenderness, skin nodules or psoriasis Eyes:  pain, redness, itching, visual blurring, dryness, foreign body sensationENT: tinnitus, hearing loss, sinus congestion, loss of smell, dry nose, bloody nose, jaw claudication, oral ulcers, loss of taste, dry mouth, hoarsenessCardiac: chest pain, palpitations, irregular heartbeat, exertional dyspneaVascular: Raynaud?s, chilblains, frostbite, venous stasis, thrombosisPulmonary: shortness of breath, cough, wheeze, hemoptysis, chest wall pain, pleuritic chest pain, current tobacco use GI: difficulty swallowing, nausea, vomiting, GERD, ulcers, constipation, diarrhea, change in bowel habits, abdominal pain, liver diseaseGU: genital ulcers, dysuria, blood in urine, nocturia, infections, kidney stonesMusculoskeletal: morning stiffness, neck pain, back pain, joint pain, joint swelling, muscle aching or tenderness, muscle weaknessNeurological: numbness, tingling, headaches, fainting, dizziness, imbalance, memory loss, seizure, strokeHeme/Lymph: swollen or tender glands, anemiaPsychiatric:  anxiety, irritability, depression, sleep disturbancePast Medical History:Past Medical History: Diagnosis Date ? Chronic kidney disease   PKD, but normal creatinine ? Eosinophilic esophagitis 08/26/2020 ? GERD (gastroesophageal reflux disease)  ? Hypertension  ? Mitral valve regurgitation  ? Palpitations  ? Polycystic kidney disease  ? Sickle cell trait (HC Code) (HC CODE) (HC Code)   [employer] testing 2021 ? Vitamin D deficiency  Past Surgical History: Procedure Laterality Date ? APPENDECTOMY  1984 ? COLONOSCOPY   ? DILATION AND CURETTAGE OF UTERUS   ? HYSTERECTOMY  08/30/2017 ? LAPAROSCOPY   ? LEEP   ? TONSILLECTOMY   ? TUBAL LIGATION   ? UPPER GASTROINTESTINAL ENDOSCOPY   Allergy:Allergies Allergen Reactions ? Penicillins Hives   Hives Has patient had a PCN reaction causing immediate rash, facial/tongue/throat swelling, SOB or lightheadedness with hypotension: YESHas patient had a PCN reaction causing severe rash involving mucus membranes or skin necrosis: NOHas patient had a PCN reaction that required hospitalization NOHas patient had a PCN reaction occurring within the last 10 years: NOIf all of the above answers are NO, then may proceed with Cephalosporin use. ? Sulfa (Sulfonamide Antibiotics) Hives and Rash ? Sulfamethoxazole-Trimethoprim Hives ? Sulfasalazine Hives   rashrash ? 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 hydrOXYzine Take 1 tablet (25 mg total) by mouth 2 (two) times daily as needed for anxiety. ? metoprolol succinate XL Take 1 tablet (25 mg total) by mouth daily. Take with or immediately following a meal. ? hydroxychloroquine Take 1 tablet (200 mg total) by mouth daily. (Patient not taking: Reported on 01/21/2022) ? lisinopriL Take 1 tablet (5 mg total) by mouth daily. (Patient not taking: Reported on 01/21/2022) No current facility-administered medications for this visit. Lab and Imaging ResultsLab Results Component Value Date  WBC 5.2 01/21/2022  HGB 13.3 01/21/2022  HCT 39.60 01/21/2022  MCV  86.5 01/21/2022  PLT 282 01/21/2022 Lab Results Component Value Date  CREATININE 0.63 01/21/2022  BUN 7 01/21/2022  NA 141 01/21/2022  K 4.4 01/21/2022  CL 103 01/21/2022  CO2 29 01/21/2022 Lab Results Component Value Date  ALT 26 01/21/2022  AST 30 01/21/2022  ALKPHOS 94 01/21/2022  BILITOT 0.4 01/21/2022  12/2019ANA	1:1280 nuclear, multiple nuclear dotsdsDNA	Negative Scl-70 	Negative SSA	NegativeSSB	NegativeJo-1 	NegativeCCP 	<16RF IgM 	10ANCA	NegativeSm Ab	NegativeMito Ab	Negative C3	161C4	4212/19/2019 [place] abdomen pelvisThe liver, gallbladder, spleen, pancreas, and adrenal glands are unremarkable, except for tiny hepatic cysts. There are multiple bilateral renal cysts and hypodensities too small to characterize, including a 2.0 cm lesion in the right kidney that likely represents a cyst with new hemorrhagic/proteinaceous debris (image 35, series 2).?There is no bowel obstruction, ascites, or lymphadenopathy.?No aggressive osseous lesions are seen.IMPRESSION: No evidence of metastatic disease.SummaryMerashe Evans is a 56 y.o. 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 She is having palpitations and Evans to see Cardiology next month--also recommended to schedule PET myocardial for sarcoid involvement. She continues to have some SOB and noted to have restriction on PFTs concerning for neuromuscular involvement so myositis panel was ordered. At this time, we discussed how it is unclear if her palpitations are related to sarcoid and we will await additional workup. We will also check muscle enzymes along with monitoring labs. In the interim, she was encouraged to restart HCQ, which she is not taking. RecommendationsUCTDSarcoid lymphadenopathyRestriction on PFTsNo parenchymal involvementLabs to monitor disease activity and medications: Orders Placed This Encounter Procedures ? C-reactive protein (CRP) ? Sedimentation rate (ESR) ? CK     (BH GH L YH) ? Aldolase ? CBC and differential ? Comprehensive metabolic panel PET myocardialMeds: Start HCQ 200 mg dailyPatient education/counseling RE: UCTD, sarcoidFollow up: The patient knows to call with any concerns before the next appointment. Next visit: 3 Lorelee Cover, MDYale Rheumatology Santresa [email or web address]/[email or web address]/2021

## 2022-03-10 NOTE — Other
I spoke to patient to let her know she remains with normal biventricular size and function\, with normal RA pressures. Mitral valve regurgitation remains stable. Patient already cancelled FDG cardiac PET. Based on my assessment, (normal ECG and no conduction disease, no syncope, normal LVEF and no WMA), I am ok to holding off doing FDG PET. I did order an NT-proBNP and IL2rc levels that have not been done yet. If abnormal, we can consider doing cardiac MRI +/- FDG cardiac PET.

## 2022-03-12 ENCOUNTER — Ambulatory Visit: Admit: 2022-03-12 | Payer: PRIVATE HEALTH INSURANCE | Primary: Internal Medicine

## 2022-03-12 ENCOUNTER — Emergency Department: Admit: 2022-03-12 | Payer: PRIVATE HEALTH INSURANCE | Primary: Internal Medicine

## 2022-03-12 ENCOUNTER — Encounter: Admit: 2022-03-12 | Payer: PRIVATE HEALTH INSURANCE | Primary: Internal Medicine

## 2022-03-12 ENCOUNTER — Inpatient Hospital Stay: Admit: 2022-03-12 | Discharge: 2022-03-12 | Payer: PRIVATE HEALTH INSURANCE

## 2022-03-12 DIAGNOSIS — M25571 Pain in right ankle and joints of right foot: Secondary | ICD-10-CM

## 2022-03-12 DIAGNOSIS — I10 Essential (primary) hypertension: Secondary | ICD-10-CM

## 2022-03-12 DIAGNOSIS — K200 Eosinophilic esophagitis: Secondary | ICD-10-CM

## 2022-03-12 DIAGNOSIS — E559 Vitamin D deficiency, unspecified: Secondary | ICD-10-CM

## 2022-03-12 DIAGNOSIS — Z882 Allergy status to sulfonamides status: Secondary | ICD-10-CM

## 2022-03-12 DIAGNOSIS — I340 Nonrheumatic mitral (valve) insufficiency: Secondary | ICD-10-CM

## 2022-03-12 DIAGNOSIS — Q613 Polycystic kidney, unspecified: Secondary | ICD-10-CM

## 2022-03-12 DIAGNOSIS — D573 Sickle-cell trait: Secondary | ICD-10-CM

## 2022-03-12 DIAGNOSIS — Z043 Encounter for examination and observation following other accident: Secondary | ICD-10-CM

## 2022-03-12 DIAGNOSIS — Z87828 Personal history of other (healed) physical injury and trauma: Secondary | ICD-10-CM

## 2022-03-12 DIAGNOSIS — Z881 Allergy status to other antibiotic agents status: Secondary | ICD-10-CM

## 2022-03-12 DIAGNOSIS — Z880 Allergy status to penicillin: Secondary | ICD-10-CM

## 2022-03-12 DIAGNOSIS — K219 Gastro-esophageal reflux disease without esophagitis: Secondary | ICD-10-CM

## 2022-03-12 DIAGNOSIS — R002 Palpitations: Secondary | ICD-10-CM

## 2022-03-12 DIAGNOSIS — N189 Chronic kidney disease, unspecified: Secondary | ICD-10-CM

## 2022-03-12 DIAGNOSIS — M25561 Pain in right knee: Secondary | ICD-10-CM

## 2022-03-12 MED ORDER — ACETAMINOPHEN 325 MG TABLET
325 mg | Freq: Once | ORAL | Status: CP
Start: 2022-03-12 — End: ?
  Administered 2022-03-12: 18:00:00 325 mg via ORAL

## 2022-03-12 NOTE — ED Notes
12:44 PM - Pt to ED A&Ox4 via EMS s/p her right knee giving out. Pt has a known torn right meniscus and is in PT. Pt fell onto knee today onto sidewalk. No head injury or LOC. No blood thinners. Denies dizziness prior to fall. Pt is also out of hr HTN medications.

## 2022-03-12 NOTE — ED Provider Notes
Chief Complaint Patient presents with ? Fall   56 year old female fell walking outside store,tripped on loose stone. Fell on right side, right knee, no head strike. No thinners, no loc.  MDM 56 year old female with no pertinent past medical history coming in with complaints of tripping on loose stone and falling down onto her right side.  No head strike or loss consciousness.  No thinners.  Reports that she has pain in her ankles.Differential includes MSK pain, doubt fractureCenter palpation over the knee in the right ankleCMS intactNo notable swelling Knee and right ankle plain film- area of concern on x-ray of the ankle was an old injury and has no tenderness to palpation currentlyPain controlDr. Barbera Setters available Physical ExamED Triage Vitals [03/12/22 1234]BP: (!) 150/93Pulse: 73Pulse from  O2 sat: n/aResp: 16Temp: 97.8 ?F (36.6 ?C)Temp src: OralSpO2: 97 % BP (!) 150/93  - Pulse 73  - Temp 97.8 ?F (36.6 ?C) (Oral)  - Resp 16  - LMP 08/30/2017 (Exact Date) Comment: hyst 08/30/2017 - SpO2 97% Physical Exam ProceduresAttestation/Critical CareClinical Impressions as of 03/12/22 1535 Ankle pain, unspecified chronicity, unspecified laterality  ED DispositionDischarge Sherron Monday, PA11/03/23 1535

## 2022-03-12 NOTE — ED Notes
4:03 PM - Right foot / ankle ACE wrapped. Crutches provided with education on their use. Pt verbalizes understanding.

## 2022-03-12 NOTE — Discharge Instructions
Please follow-up with your primary care doctor as planned.  If you notice any new or worsening symptoms please return to the emergency department.

## 2022-03-18 ENCOUNTER — Encounter: Admit: 2022-03-18 | Payer: PRIVATE HEALTH INSURANCE | Attending: Cardiovascular Disease | Primary: Internal Medicine

## 2022-03-25 NOTE — Other
Hi Ms. Cathy, Evans reviewed your holter, which was overall reassuring. You did have some episodes of SVT or supraventricular arrhyhtmia, which is what's causing your palpitations. These are not worrisome and you are already on metoprolol, which we can always increase in dose if the palpitations bother you too much. Otherwise, no changes. I hope all is well otherwise.Best,Dr. Reece Agar

## 2022-03-31 ENCOUNTER — Ambulatory Visit: Admit: 2022-03-31 | Payer: PRIVATE HEALTH INSURANCE | Attending: Cardiovascular Disease | Primary: Internal Medicine

## 2022-03-31 ENCOUNTER — Inpatient Hospital Stay: Admit: 2022-03-31 | Discharge: 2022-03-31 | Payer: PRIVATE HEALTH INSURANCE | Primary: Internal Medicine

## 2022-04-06 ENCOUNTER — Inpatient Hospital Stay: Admit: 2022-04-06 | Discharge: 2022-04-06 | Payer: PRIVATE HEALTH INSURANCE | Primary: Internal Medicine

## 2022-04-20 ENCOUNTER — Encounter: Admit: 2022-04-20 | Payer: PRIVATE HEALTH INSURANCE | Attending: Internal Medicine | Primary: Internal Medicine

## 2022-04-20 ENCOUNTER — Inpatient Hospital Stay: Admit: 2022-04-20 | Discharge: 2022-04-20 | Payer: PRIVATE HEALTH INSURANCE | Primary: Internal Medicine

## 2022-04-20 DIAGNOSIS — M351 Other overlap syndromes: Secondary | ICD-10-CM

## 2022-04-21 LAB — ALBUMIN: BKR ALBUMIN: 4 g/dL (ref 3.6–4.9)

## 2022-04-21 LAB — C-REACTIVE PROTEIN     (CRP): BKR C-REACTIVE PROTEIN, HIGH SENSITIVITY: 8.2 mg/L — ABNORMAL HIGH

## 2022-04-21 LAB — CBC WITH AUTO DIFFERENTIAL
BKR WAM ABSOLUTE IMMATURE GRANULOCYTES.: 0.02 x 1000/??L (ref 0.00–0.30)
BKR WAM ABSOLUTE LYMPHOCYTE COUNT.: 2 x 1000/??L (ref 0.60–3.70)
BKR WAM ABSOLUTE NRBC (2 DEC): 0 x 1000/??L (ref 0.00–1.00)
BKR WAM ANALYZER ANC: 2.8 x 1000/??L (ref 2.00–7.60)
BKR WAM BASOPHIL ABSOLUTE COUNT.: 0.05 x 1000/??L (ref 0.00–1.00)
BKR WAM BASOPHILS: 0.8 % (ref 0.0–1.4)
BKR WAM EOSINOPHIL ABSOLUTE COUNT.: 0.54 x 1000/??L (ref 0.00–1.00)
BKR WAM EOSINOPHILS: 9.1 % — ABNORMAL HIGH (ref 0.0–5.0)
BKR WAM HEMATOCRIT (2 DEC): 39 % (ref 35.00–45.00)
BKR WAM HEMOGLOBIN: 12.8 g/dL (ref 11.7–15.5)
BKR WAM IMMATURE GRANULOCYTES: 0.3 % (ref 0.0–1.0)
BKR WAM LYMPHOCYTES: 33.8 % (ref 17.0–50.0)
BKR WAM MCH (PG): 28.6 pg (ref 27.0–33.0)
BKR WAM MCHC: 32.8 g/dL — ABNORMAL HIGH (ref 31.0–36.0)
BKR WAM MCV: 87.2 fL (ref 80.0–100.0)
BKR WAM MONOCYTE ABSOLUTE COUNT.: 0.5 x 1000/??L (ref 0.00–1.00)
BKR WAM MONOCYTES: 8.5 % (ref 4.0–12.0)
BKR WAM MPV: 9.1 fL — ABNORMAL HIGH (ref <90)
BKR WAM NEUTROPHILS: 47.5 % (ref 39.0–72.0)
BKR WAM NUCLEATED RED BLOOD CELLS: 0 % (ref 0.0–1.0)
BKR WAM PLATELETS: 307 x1000/??L (ref 150–420)
BKR WAM RDW-CV: 13.4 % (ref 11.0–15.0)
BKR WAM RED BLOOD CELL COUNT.: 4.47 M/??L (ref 4.00–6.00)
BKR WAM WHITE BLOOD CELL COUNT: 5.9 x1000/??L (ref 4.0–11.0)

## 2022-04-21 LAB — URINALYSIS-MACROSCOPIC W/REFLEX MICROSCOPIC
BKR BILIRUBIN, UA: NEGATIVE
BKR BLOOD, UA: NEGATIVE M/??L (ref 4.00–6.00)
BKR GLUCOSE, UA: NEGATIVE
BKR KETONES, UA: NEGATIVE
BKR LEUKOCYTE ESTERASE, UA: NEGATIVE
BKR NITRITE, UA: NEGATIVE mmol/L (ref 98–107)
BKR PH, UA: 6.5 (ref 5.5–7.5)
BKR PROTEIN, UA: NEGATIVE mg/dL (ref 90–180)
BKR SPECIFIC GRAVITY, UA: 1.014 (ref 1.005–1.030)

## 2022-04-21 LAB — BASIC METABOLIC PANEL
BKR ANION GAP: 17 (ref 7–17)
BKR BLOOD UREA NITROGEN: 9 mg/dL (ref 6–20)
BKR BUN / CREAT RATIO: 15 (ref 8.0–23.0)
BKR CALCIUM: 10.7 mg/dL — ABNORMAL HIGH (ref <75)
BKR CHLORIDE: 103 mmol/L (ref 98–107)
BKR CO2: 23 mmol/L (ref 20–30)
BKR CREATININE: 0.6 mg/dL (ref 0.40–1.30)
BKR EGFR, CREATININE (CKD-EPI 2021): >60 mL/min/{1.73_m2} (ref >=60)
BKR GLUCOSE: 110 mg/dL — ABNORMAL HIGH (ref 70–100)
BKR POTASSIUM: 4 mmol/L (ref 3.3–5.3)
BKR SODIUM: 143 mmol/L (ref 136–144)
BKR UROBILINOGEN, UA (MG/DL): 23 mmol/L (ref 20–30)

## 2022-04-21 LAB — C3 COMPLEMENT: BKR C3 COMPLEMENT: 164 mg/dL (ref 90–180)

## 2022-04-21 LAB — SEDIMENTATION RATE (ESR): BKR SEDIMENTATION RATE, ERYTHROCYTE: 21 mm/hr — ABNORMAL HIGH (ref 0–20)

## 2022-04-21 LAB — C4 COMPLEMENT     (BH GH L LMW YH): BKR C4 COMPLEMENT: 38 mg/dL (ref 10–40)

## 2022-04-22 ENCOUNTER — Inpatient Hospital Stay: Admit: 2022-04-22 | Discharge: 2022-04-22 | Payer: PRIVATE HEALTH INSURANCE | Primary: Internal Medicine

## 2022-04-22 ENCOUNTER — Ambulatory Visit: Admit: 2022-04-22 | Payer: PRIVATE HEALTH INSURANCE | Attending: Cardiovascular Disease | Primary: Internal Medicine

## 2022-04-22 ENCOUNTER — Encounter: Admit: 2022-04-22 | Payer: PRIVATE HEALTH INSURANCE | Attending: Cardiovascular Disease | Primary: Internal Medicine

## 2022-04-22 DIAGNOSIS — R002 Palpitations: Secondary | ICD-10-CM

## 2022-04-22 DIAGNOSIS — D869 Sarcoidosis, unspecified: Secondary | ICD-10-CM

## 2022-04-22 DIAGNOSIS — E785 Hyperlipidemia, unspecified: Secondary | ICD-10-CM

## 2022-04-22 LAB — NT-PROBNPE: BKR B-TYPE NATRIURETIC PEPTIDE, PRO (PROBNP): <36 pg/mL (ref <125.0)

## 2022-04-22 LAB — LIPID PANEL
BKR CHOLESTEROL/HDL RATIO: 4 (ref 0.0–5.0)
BKR CHOLESTEROL: 222 mg/dL — ABNORMAL HIGH
BKR HDL CHOLESTEROL: 56 mg/dL (ref >=40)
BKR LDL CHOLESTEROL SAMPSON CALCULATED: 156 mg/dL — ABNORMAL HIGH
BKR TRIGLYCERIDES: 57 mg/dL (ref 6–20)

## 2022-04-22 LAB — DSDNA ANTIBODY, IGG WITH IFA CONFIRMATION     (BH LMW YH): BKR DSDNA AB: 1.2 IU/mL (ref <10.0)

## 2022-04-22 NOTE — Other
Hi Ms. Hara, Polick you're well! Just wanted to follow up on your lab tests. So far everything is normal except for your calcium which is a little high; sometimes that happens and it goes away on its own when we recheck it. Otherwise I'm not worried about a flare of your connective tissue disease based on the labs. I'll order a repeat blood draw to do next week to double check the calcium; you can get it drawn at any convenient [place] lab location without an appointment (I would just call ahead and ask if they'll be able to accommodate you). Let us know how you're doing with the antihistamine for the itching and have a pleasant evening!Lucianne Muss, MD

## 2022-04-23 NOTE — Other
Hi Cathy Evans, Cathy Evans received some (but not all) of your labs. Your cholesterol is elevated at a level that I would recommend cholesterol lowering meds. While we wait for the rest of the labs, please think about it and let me know. My recommendations would be a medication like rosuvastatin (crestor) 10 mg daily to begin with.I hope all is well otherwise.Dr. Reece Agar

## 2022-04-24 ENCOUNTER — Encounter: Admit: 2022-04-24 | Payer: PRIVATE HEALTH INSURANCE | Attending: Cardiovascular Disease | Primary: Internal Medicine

## 2022-04-24 DIAGNOSIS — E785 Hyperlipidemia, unspecified: Secondary | ICD-10-CM

## 2022-04-26 ENCOUNTER — Inpatient Hospital Stay: Admit: 2022-04-26 | Discharge: 2022-04-26 | Payer: PRIVATE HEALTH INSURANCE | Primary: Internal Medicine

## 2022-04-26 LAB — APOLIPOPROTEIN B: APOLIPOPROTEIN B: 120 mg/dL — ABNORMAL HIGH (ref <90)

## 2022-04-26 MED ORDER — ROSUVASTATIN 10 MG TABLET
10 mg | ORAL_TABLET | Freq: Every day | ORAL | 4 refills | Status: AC
Start: 2022-04-26 — End: ?

## 2022-04-26 NOTE — Other
Hi Ms. Cathy, Evans of the special tests came back abnormal. I already sent the cholesterol meds and labs anyways. We'll follow-up the results.Take care!Dr. Reece Agar

## 2022-04-27 ENCOUNTER — Inpatient Hospital Stay: Admit: 2022-04-27 | Discharge: 2022-04-27 | Payer: PRIVATE HEALTH INSURANCE | Primary: Internal Medicine

## 2022-04-27 DIAGNOSIS — I10 Essential (primary) hypertension: Secondary | ICD-10-CM

## 2022-04-28 LAB — PTH, INTACT WITHOUT CALCIUM: BKR PARATHYROID HORMONE INTACT: 29.4 pg/mL (ref 15.0–65.0)

## 2022-04-28 LAB — PROTEIN, TOTAL: BKR PROTEIN TOTAL: 7.4 g/dL (ref 6.6–8.7)

## 2022-04-28 LAB — VITAMIN D, 25-HYDROXY: BKR VITAMIN D 25-HYDROXY TOTAL: 17 ng/mL

## 2022-04-28 LAB — BASIC METABOLIC PANEL
BKR ANION GAP: 8 (ref 7–17)
BKR BLOOD UREA NITROGEN: 14 mg/dL (ref 6–20)
BKR BUN / CREAT RATIO: 23 (ref 8.0–23.0)
BKR CALCIUM: 9.6 mg/dL (ref 8.8–10.2)
BKR CHLORIDE: 105 mmol/L (ref 98–107)
BKR CO2: 28 mmol/L (ref 20–30)
BKR CREATININE: 0.61 mg/dL (ref 0.40–1.30)
BKR EGFR, CREATININE (CKD-EPI 2021): >60 mL/min/{1.73_m2} (ref >=60)
BKR GLUCOSE: 98 mg/dL (ref 70–100)
BKR POTASSIUM: 4.1 mmol/L (ref 3.3–5.3)
BKR SODIUM: 141 mmol/L (ref 136–144)

## 2022-04-28 LAB — ALBUMIN: BKR ALBUMIN: 3.9 g/dL (ref 3.6–4.9)

## 2022-04-29 ENCOUNTER — Encounter
Admit: 2022-04-29 | Payer: PRIVATE HEALTH INSURANCE | Attending: Student in an Organized Health Care Education/Training Program | Primary: Internal Medicine

## 2022-04-29 ENCOUNTER — Ambulatory Visit
Admit: 2022-04-29 | Payer: PRIVATE HEALTH INSURANCE | Attending: Student in an Organized Health Care Education/Training Program | Primary: Internal Medicine

## 2022-04-29 DIAGNOSIS — K200 Eosinophilic esophagitis: Secondary | ICD-10-CM

## 2022-04-29 DIAGNOSIS — N189 Chronic kidney disease, unspecified: Secondary | ICD-10-CM

## 2022-04-29 DIAGNOSIS — D573 Sickle-cell trait: Secondary | ICD-10-CM

## 2022-04-29 DIAGNOSIS — I340 Nonrheumatic mitral (valve) insufficiency: Secondary | ICD-10-CM

## 2022-04-29 DIAGNOSIS — D869 Sarcoidosis, unspecified: Secondary | ICD-10-CM

## 2022-04-29 DIAGNOSIS — Z87828 Personal history of other (healed) physical injury and trauma: Secondary | ICD-10-CM

## 2022-04-29 DIAGNOSIS — Q613 Polycystic kidney, unspecified: Secondary | ICD-10-CM

## 2022-04-29 DIAGNOSIS — K219 Gastro-esophageal reflux disease without esophagitis: Secondary | ICD-10-CM

## 2022-04-29 DIAGNOSIS — E559 Vitamin D deficiency, unspecified: Secondary | ICD-10-CM

## 2022-04-29 DIAGNOSIS — I10 Essential (primary) hypertension: Secondary | ICD-10-CM

## 2022-04-29 DIAGNOSIS — R002 Palpitations: Secondary | ICD-10-CM

## 2022-04-29 LAB — LIPOPROTEIN A (LPA): LIPOPROTEIN (A): 119 nmol/L — ABNORMAL HIGH (ref <75)

## 2022-04-29 NOTE — Progress Notes
[facility] Department of Internal Medicine Section of Pulmonary, Critical Care, and Sleep Medicine	[facility] [address];  [place], [place] [address]AOZHY:  [phone]  Fax: [phone], Glo Herring, MD, PhD				Medical Director, Milas Hock, MD			Associate Director, Stark Klein, MD, [facility] Ginnie Smart, MD, MScJean Rhina Brackett, MDGenta Camillia Herter, MD, MPHChangwan Elizbeth Squires, MD, MPHJonathan Siner, MDKelly Margaretmary Eddy, APRNLisa Rebmann, RNChristine Tancreti, RNPatient Name:  Cathy Evans of Birth:  1967-12-08Date of Service: 12/21/2023EPIC MRN:  [id number] Provider:  Fletcher Anon Cerro-Chiang, MDI had the pleasure of seeing Cathy Evans in follow up for sarcoidosis. She was found to have mediastinal adenopathies on [place] chest as part of the workup for a neck mass. 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 At our first visit, she had some CTD related symptoms as well as palpitations. She had a Holter monitor for 2 weeks which was unrevealing. 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 CHART REVIEW:02/11/22 Rheumatology Annye Rusk): advised to restart HCQ 200mg  QD. CK and aldolase were normal10/25/23 Cardiology [facility]):  No evidence of conduction disease, arrhythmia or syncope. Repeat Holter was reasuring, had some SVT episodes but on metoprolol. Repeat echo with normal size and function. CURRENT VISIT:She reports head and toe pain in addition to a general feeling of tightness in her body. The sensation worsens throughout the day and it worst at night. Her rheumatologist advised her to restart Plaquenil but the patient deferred in favor of herbal remedies. She is scheduled to see a Rheumatologist soon.?She reports some episodes shortness of breath that she attributed to anxiety but denies any cough.MEDICATIONS: Current Outpatient Medications: ?  cetirizine, Take 1 tablet (10 mg total) by mouth daily for 90 doses. Take one by mouth every day for 2 days; and then go up one by mouth twice a day?  metoprolol succinate XL, Take 1 tablet (25 mg total) by mouth daily. Take with or immediately following a meal.?  rosuvastatin, Take 1 tablet (10 mg total) by mouth daily.ALLERGIES:Allergies Allergen Reactions ? Penicillins Hives   Hives Has patient had a PCN reaction causing immediate rash, facial/tongue/throat swelling, SOB or lightheadedness with hypotension: YESHas patient had a PCN reaction causing severe rash involving mucus membranes or skin necrosis: NOHas patient had a PCN reaction that required hospitalization NOHas patient had a PCN reaction occurring within the last 10 years: NOIf all of the above answers are NO, then may proceed with Cephalosporin use. ? Sulfa (Sulfonamide Antibiotics) Hives and Rash ? Sulfamethoxazole-Trimethoprim Hives ? Sulfasalazine Hives   rashrash ? Moxifloxacin Rash PAST MEDICAL HISTORY:Past Medical History: Diagnosis Date ? Chronic kidney disease   PKD, but normal creatinine ? Eosinophilic esophagitis 08/26/2020 ? GERD (gastroesophageal reflux disease)  ? History of torn meniscus of right knee 02/2022 ? Hypertension  ? Mitral valve regurgitation  ? Palpitations  ? Polycystic kidney disease  ? Sickle cell trait (HC Code) (HC CODE) (HC Code)   Natera testing 2021 ? Vitamin D deficiency  Past Surgical History: Procedure Laterality Date ? APPENDECTOMY  1984 ? COLONOSCOPY   ? DILATION AND CURETTAGE OF UTERUS   ? HYSTERECTOMY  08/30/2017 ? LAPAROSCOPY   ? LEEP   ? TONSILLECTOMY   ? TUBAL LIGATION   ? UPPER GASTROINTESTINAL ENDOSCOPY   PHYSICAL EXAMINATION:Vital signs:  BP 129/84 (Site: r a, Position: Sitting, Cuff Size: Medium)  - Pulse 78  - Temp 98 ?F (36.7 ?C) (Temporal)  - Resp 16  - Ht 5' 4 (1.626 m)  - Wt  80 kg  - LMP 08/30/2017 (Exact Date) Comment: hyst 08/30/2017 - SpO2 100%  - BMI 30.27 kg/m?  Weight 176.37 lbs. on room airPhysical ExamConstitutional:     Appearance: Normal appearance. HENT:    Head: Normocephalic.    Mouth/Throat:    Mouth: Mucous membranes are moist.    Pharynx: Oropharynx is clear. No oropharyngeal exudate or posterior oropharyngeal erythema. Cardiovascular:    Rate and Rhythm: Normal rate and regular rhythm.    Pulses: Normal pulses. Pulmonary:    Effort: Pulmonary effort is normal. No respiratory distress.    Breath sounds: No stridor. No wheezing. Lymphadenopathy:    Cervical: No cervical adenopathy. Skin:   Capillary Refill: Capillary refill takes less than 2 seconds. Neurological:    General: No focal deficit present.    Mental Status: She is alert and oriented to person, place, and time. Psychiatric:       Mood and Affect: Mood normal.       Behavior: Behavior normal. REVIEW OF DATA:Pulmonary Function Testing:?Date FVC FEV1 FEV1/FVC TLC DLCO ?01/21/2022 2.46 (73%) 1.96 (73%) 80% 3.44 (66%) 15.95 (78%) ?Personal Interpretation:  No obstruction, mild restriction, normal DLCOChest Radiology:?10/18/21 [place] ChestLungs/Airways/Pleura: Central tracheobronchial tree is patent. Minimal tree-in-bud and groundglass opacities in the right lower lobe, likely infectious or inflammatory etiology (4:367). No reticulations, bronchiectasis, honeycombing, architectural distortion or other evidence of interstitial lung disease. No suspicious pulmonary nodules or masses. No pleural effusion or pneumothorax.?Mediastinum/Lymph nodes: Left subclavicular, superior mediastinal, right paratracheal, prevascular, bilateral hilar and subcarinal adenopathy. Largest nodes measure up to 14 mm in the subcarinal region.?Other Radiology/Studies:Superior AP 6/23/23The liver, gallbladder, spleen, pancreas, and adrenal glands are unremarkable, except for tiny hepatic cysts. ?Again seen are multiple bilateral renal cysts and hypodensities too small to characterize, including a 2.9 cm lesion in the right kidney that likely represents a cyst with new hemorrhagic/proteinaceous debris (image 39, series 2) that has increased from 2.0 cm.?There is no bowel obstruction, ascites, or lymphadenopathy.?No aggressive osseous lesions are seen.???Pathology:7/11/23Lung, right mainstem, endobronchial biopsy: ? ? ? - Superficial fragments of bronchial mucosa with no significant abnormality. ?FNA lung1. ?RIGHT MIDDLE LOBE BRONCHOALVEOLAR LAVAGE: ? ? ?- NEGATIVE FOR MALIGNANT CELLS. ? ? ?- REACTIVE PULMONARY MACROPHAGES. ? ? ?- GMS STAIN IS NEGATIVE FOR FUNGUS/PJP. ? ? ?- AFB STAIN IS NEGATIVE FOR MYCOBACTERIA. 2. ?LYMPH NODE, 4R, FINE NEEDLE ASPIRATION/WASH: ? ? ?- NEGATIVE FOR MALIGNANT CELLS. ? ? ? ? ? - REACTIVE LYMPHOCYTES AND FOCAL LYMPHOHISTIOCYTIC AGGREGATES. 3. ?LYMPH NODE, 10R, FINE NEEDLE ASPIRATION/WASH: ? ? ?- NEGATIVE FOR MALIGNANT CELLS. ? ? ? ? ? - REACTIVE LYMPHOCYTES AND FOCAL LYMPHOHISTIOCYTIC AGGREGATES. 4. ?LYMPH NODE, 4L, FINE NEEDLE ASPIRATION/WASH: ? ? ?- NEGATIVE FOR MALIGNANT CELLS. ? ? ?- FOCAL NON-CASEATING GRANULOMATOUS INFLAMMATION. ? ? ?- GMS STAIN IS NEGATIVE FOR FUNGUS/PJP. ? ? ?- AFB STAIN IS NEGATIVE FOR MYCOBACTERIA.??Cardiac studies:07/11/20?* Normal left ventricular size, wall thickness, systolic function and wall motion. LVEF calculated by 3DE was 69%. ?Diastolic function was difficult to determine due to mitral valve abnormalities.* Normal right ventricular cavity size and systolic function. ?Right ventricular systolic pressure is unable to be estimated due to insufficient Doppler signal.* Atria are normal in size. ?No interatrial shunt by color Doppler. ?Interatrial septum is bowed toward the left, consistent with elevated right atrial pressure.* Normal mitral valve leaflets. ?Moderate posteriorly directed mitral regurgitation. By PISA, EROA PISA is 0.24 cm2 and regurgitant volume 46 ml.* All visible segments of the aorta are normal in size.* IVC diameter < 2.1 cm that collapses >  50% with a sniff suggests normal RAP (0-5 mmHg, mean 3 mmHg).* No significant pericardial effusion.* No prior study available for comparison.?Labs:10/2021:CRP 5.1, ESR 34Neg ACE, RFNormal complementIgG: 1625, remainder unremarkableANA > 1:2560 (nuclear dots) 1:160 speckled. DsDNA, RNP, SSA, SSB, Smith, all negative?12/2019ANA?????1:1280 nuclear, multiple nuclear dotsdsDNA?Negative Scl-70 ?Negative SSA?????NegativeSSB?????NegativeJo-1 ????NegativeCCP ????<16RF IgM ???????????10ANCA??NegativeSm Ab?NegativeMito Ab????????????Negative C3????????161C4????????42IMPRESSION:IMPRESSION:Cathy Evans is a 56 y.o. 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 On [place] scan she has no evidence of parenchymal involvement from sarcoidosis.  She is evidence of restriction on pulmonary function tests and I believe these could be due to neuromuscular weakness given that her parenchymal findings are quite minimal.?Mostly, I am concerned about her palpitations.  These are happening more often and sometimes she recalls that she has felt like she was going to pass out from them.  She is scheduled to have PET scan to further evaluate her cardiac tissue, but they should be referred to a sarcoid specialist in Cardiology.?RECOMMENDATIONS:SarcoidosisPathology: EBUS with FNA showing non caseating granulomasOrgan involvement: LADsPrimary target organ for therapy: none currentlyCurrent treatment: nonePast treatments: noneFurther work-up:?	PFTs in a year to evaluate for obstruction ?	She is already been referred to ophthalmology.?	No indication for treatment at this time?Please do not hesitate to call me if you would like to discuss Cathy Evans care.Sincerely,Henli Hey Ginnie Smart, MD, MScInstructorYale Interstitial Lung Disease CenterSection of Pulmonary and Critical Care MedicineYale School of MedicineOn the day of this patient's encounter, a total of 37 minutes was personally spent by me, which includes time spent on chart and records review, review of radiological images, medical consultation, education, coordination of care/services , counseling and chart documentation.This does not include any resident/fellow teaching time, or any time spent performing a procedural service.Scribed for Cerro-Chiang, Feliciana Rossetti, MD by Jerel Shepherd, medical scribe April 29, 2022??The documentation recorded by the scribe accurately reflects the services I personally performed and the decisions made by me. I reviewed and confirmed all material entered and/or pre-charted by the scribe.?

## 2022-05-02 LAB — INTERLEUKIN 2 RECEPTOR SOLUBLE (BH GH LMW YH): INTERLEUKIN 2 RECEPTOR: 3397 pg/mL — ABNORMAL HIGH (ref 532–1891)

## 2022-05-13 ENCOUNTER — Telehealth: Admit: 2022-05-13 | Payer: PRIVATE HEALTH INSURANCE | Primary: Internal Medicine

## 2022-05-13 ENCOUNTER — Ambulatory Visit: Admit: 2022-05-13 | Payer: PRIVATE HEALTH INSURANCE | Attending: Pulmonary Disease | Primary: Internal Medicine

## 2022-05-13 ENCOUNTER — Ambulatory Visit: Admit: 2022-05-13 | Payer: PRIVATE HEALTH INSURANCE | Primary: Internal Medicine

## 2022-05-13 NOTE — Telephone Encounter
SUBJECT: Referred patient - unable to contact to scheduleDear Dr. Tamera Reason you for [facility].  We have attempted to reach your patient to schedule an appointment, but unfortunately she failed to complete required questionnaire sent via e-mail within 30 days.  If the patient is interested in scheduling an appointment, please place a new referral in order for [place] to contact your patient again for scheduling. Sincerely,[facility]

## 2022-05-17 ENCOUNTER — Encounter: Admit: 2022-05-17 | Payer: PRIVATE HEALTH INSURANCE | Attending: Rheumatology | Primary: Internal Medicine

## 2022-05-17 DIAGNOSIS — R590 Localized enlarged lymph nodes: Secondary | ICD-10-CM

## 2022-05-17 DIAGNOSIS — D869 Sarcoidosis, unspecified: Secondary | ICD-10-CM

## 2022-05-17 DIAGNOSIS — N189 Chronic kidney disease, unspecified: Secondary | ICD-10-CM

## 2022-05-17 DIAGNOSIS — I10 Essential (primary) hypertension: Secondary | ICD-10-CM

## 2022-05-17 DIAGNOSIS — D573 Sickle-cell trait: Secondary | ICD-10-CM

## 2022-05-17 DIAGNOSIS — E559 Vitamin D deficiency, unspecified: Secondary | ICD-10-CM

## 2022-05-17 DIAGNOSIS — Q613 Polycystic kidney, unspecified: Secondary | ICD-10-CM

## 2022-05-17 DIAGNOSIS — K219 Gastro-esophageal reflux disease without esophagitis: Secondary | ICD-10-CM

## 2022-05-17 DIAGNOSIS — Z87828 Personal history of other (healed) physical injury and trauma: Secondary | ICD-10-CM

## 2022-05-17 DIAGNOSIS — K200 Eosinophilic esophagitis: Secondary | ICD-10-CM

## 2022-05-17 DIAGNOSIS — R002 Palpitations: Secondary | ICD-10-CM

## 2022-05-17 DIAGNOSIS — I340 Nonrheumatic mitral (valve) insufficiency: Secondary | ICD-10-CM

## 2022-05-17 NOTE — Patient Instructions
Followup with CardiologySchedule appointment with DermatologyTry diclofenac topical

## 2022-05-18 ENCOUNTER — Encounter: Admit: 2022-05-18 | Payer: PRIVATE HEALTH INSURANCE | Primary: Internal Medicine

## 2022-05-18 ENCOUNTER — Encounter
Admit: 2022-05-18 | Payer: PRIVATE HEALTH INSURANCE | Attending: Vascular and Interventional Radiology | Primary: Internal Medicine

## 2022-05-18 NOTE — Progress Notes
Review of Systems   Constitutional: Negative.    HENT: Negative.    Eyes: Negative.    Respiratory: Negative.    Cardiovascular: Negative.    Gastrointestinal: Negative.    Endocrine: Negative.    Genitourinary: Negative.    Musculoskeletal: Negative.    Skin: Negative.    Allergic/Immunologic: Negative.    Neurological: Negative.    Hematological: Negative.    Psychiatric/Behavioral: Negative.

## 2022-05-18 NOTE — Progress Notes
VIDEO TELEHEALTH VISIT: This clinician is part of the telehealth program and is conducting this visit in a currently approved location. For this visit the clinician and patient were present via interactive audio & video telecommunications system that permits real-time communications, via the [employer].Patient's use of the telehealth platform followed consent and acknowledges agreement to permit telehealth for this visit. State patient is located in: [place]The clinician is appropriately licensed in the above state to provide care for this visit. Other individuals present during the telehealth encounter and their role/relation: noneBecause this visit was completed over video, a hands-on physical exam was not performed. Patient/parent or guardian understands and knows to call back if condition changes.[place] RheumatologyDiagnosis: Undifferentiated Connective Tissue Disease Serologies: +ANA 1:2560 nuclear dots; negative Sm, RNP, SSA, SSB, dsDNALA not detected, B2GP, ACL Ab negativeCCP negative, RF negativeUrine Prot/Cr ratio 0.06C3 140, C4 : alopecia, arthralgias, dry eyes, dry mouthPast medications: hydroxychloroquine 200 mg BIDCurrent therapy: noneHPI from 08/2018:Cathy Evans presents for initial visit, never saw a rheumatologist in the past.Cathy Evans is a 57 y.o. female with PMHx notable for HTN, polycystic kidney disease, who presents for initial visit. She was referred by her gastroenterologist Dr. Vesta Mixer for concern of positive ANA, found as part of evaluation for dysphagia. She has underwent EGD and colonoscopy fall 2019 but notes symptoms persist despite initiation of PPI. She reports fatigue, hair thinning, episode of oral ulcers, along with arthralgia and myalgia, pregnancy loss at 3 months and possible issue with her platelets in the past. 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 Pt encouraged to use MyChart to let us know if she has any questions or concerns.Interval Change:--Seen 12/2018, we discussed how her presentation may be suggestive of an evolving UCTD for which we discussed starting HCQ (AEs discussed) to which she agreed. She will start HCQ 200 mg daily and schedule Ophthalmology visit with [facility]. She will return for followup in 3 months or earlier PRN.--Seen 05/22/2019, on HCQ 200 mg daily that was started after her last visit, noting overall improvement, particularly in her MSK symptoms. She still endorses sicca, hair thinning, and fatigue; she describes some mild hand symptoms that are suggestive of inflammatory arthritis. We discussed continuing to monitor on HCQ 200 mg daily--she is up to date on Ophthalmology evaluation and has VF testing next week. We will see her for followup in 3-4 months.  --Seen 08/28/2019, on HCQ 200 mg daily, noting some MSK symptoms that are seem more mechanical in etiology. She also continues to have hair thinning, dry mouth, and some dysphagia. Her last GI visit was in early 2020 after endoscopies in 2019; I encouraged her to followup with GI prior to considering barium swallow study. At this time, we discussed scheduling an in-person visit and will plan to repeat labs before her followup in 3-4 months. 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 --Seen 12/03/2021, for her first in-person visit with me. She was started on HCQ but returns today after she self-DCd one year prior. Recently, clinical course c/b parotitis and thoracic adenopathy, now undergoing [place] abdomen/pelvis and evaluation by Oncology. Mammogram and ultrasound notable for BI-RAD2. She also describes fatigue, sicca, dysphagia, cramping abdominal pain. At this time, we discussed awaiting possible biopsy for further elucidation of underlying process--we will touch base after workup for thoracic adenopathy. In the interim, we will repeat serologies. We  will hold on resuming HCQ for now. --Seen 02/11/2022, with palpitations and going to see Cardiology next month--also recommended to schedule PET myocardial for sarcoid involvement. She continues to have some SOB and noted to have restriction on PFTs concerning for neuromuscular involvement so myositis panel was ordered. At this time, we discussed how it is unclear if her palpitations are related to sarcoid and we will await additional workup. We will also check muscle enzymes along with monitoring labs. In the interim, she was encouraged to restart HCQ, which she is not taking. Today:She has been having fatigue. She continues to have palpitations, notes increased palpitations when she eats certain foods or when she is eating, lies down a certain way. She feels her heart rates after she swallows. She underwent 2decho that showed LVEF 59%, no pericardial effusion, RAP normal. She describes feeling winded.  She has constant pain in her left mid-back area, notes worsening pain when she lies down. Also with increased pain upon palpation. She uses heat, has been taking Tylenol without significant improvement. Other joints are stable. Some tightness upon ROM of shoulders. She has been having rashes that pruritic on her chest, notes topical treatment ordered by PCP was not available at the pharmacy.Review of Systems (negative except as noted; positive findings bold)Constitutional: weight changes, fatigue, malaise, fever, chills, sweatsSkin: rashes, photosensitivity, hives, easy bruisability, alopecia, scalp tenderness, skin nodules or psoriasis Eyes:  pain, redness, itching, visual blurring, dryness, foreign body sensationENT: tinnitus, hearing loss, sinus congestion, loss of smell, dry nose, bloody nose, jaw claudication, oral ulcers, loss of taste, dry mouth, hoarsenessCardiac: chest pain, palpitations, irregular heartbeat, exertional dyspneaVascular: Raynaud?s, chilblains, frostbite, venous stasis, thrombosisPulmonary: shortness of breath, cough, wheeze, hemoptysis, chest wall pain, pleuritic chest pain, current tobacco use GI: difficulty swallowing, nausea, vomiting, GERD, ulcers, constipation, diarrhea, change in bowel habits, abdominal pain, liver diseaseGU: genital ulcers, dysuria, blood in urine, nocturia, infections, kidney stonesMusculoskeletal: morning stiffness, neck pain, back pain, joint pain, joint swelling, muscle aching or tenderness, muscle weaknessNeurological: numbness, tingling, headaches, fainting, dizziness, imbalance, memory loss, seizure, strokeHeme/Lymph: swollen or tender glands, anemiaPsychiatric:  anxiety, irritability, depression, sleep disturbancePast Medical History:Past Medical History: Diagnosis Date ? Chronic kidney disease   PKD, but normal creatinine ? Eosinophilic esophagitis 08/26/2020 ? GERD (gastroesophageal reflux disease)  ? History of torn meniscus of right knee 02/2022 ? Hypertension  ? Mitral valve regurgitation  ? Palpitations  ? Polycystic kidney disease  ? Sickle cell trait (HC Code) (HC CODE) (HC Code)   Natera testing 2021 ? Vitamin D deficiency  Past Surgical History: Procedure Laterality Date ? APPENDECTOMY  1984 ? COLONOSCOPY   ? DILATION AND CURETTAGE OF UTERUS   ? HYSTERECTOMY  08/30/2017 ? LAPAROSCOPY   ? LEEP   ? TONSILLECTOMY   ? TUBAL LIGATION   ? UPPER GASTROINTESTINAL ENDOSCOPY   Allergy:Allergies Allergen Reactions ? Penicillins Hives   Hives Has patient had a PCN reaction causing immediate rash, facial/tongue/throat swelling, SOB or lightheadedness with hypotension: YESHas patient had a PCN reaction causing severe rash involving mucus membranes or skin necrosis: NOHas patient had a PCN reaction that required hospitalization NOHas patient had a PCN reaction occurring within the last 10 years: NOIf all of the above answers are NO, then may proceed with Cephalosporin use. ? Sulfa (Sulfonamide Antibiotics) Hives and Rash ? Sulfamethoxazole-Trimethoprim Hives ? Sulfasalazine Hives   rashrash ? 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 cetirizine Take 1 tablet (10 mg total) by mouth daily for 90 doses. Take one by mouth every day for 2 days; and then go up one by mouth twice a day ? cholecalciferol (vitamin D3) Take 1 tablet (1,000 Units total) by mouth daily. ? metoprolol succinate XL Take 1 tablet (25 mg total) by mouth daily. Take with or immediately following a meal. ? rosuvastatin Take 1 tablet (10 mg total) by mouth daily. (Patient not taking: Reported on 05/17/2022) No current facility-administered medications for this visit. Lab and Imaging ResultsLab Results Component Value Date  WBC 5.9 04/20/2022  HGB 12.8 04/20/2022  HCT 39.00 04/20/2022  MCV 87.2 04/20/2022  PLT 307 04/20/2022 Lab Results Component Value Date  CREATININE 0.61 04/27/2022  BUN 14 04/27/2022  NA 141 04/27/2022  K 4.1 04/27/2022  CL 105 04/27/2022  CO2 28 04/27/2022 Lab Results Component Value Date  ALT 28 02/26/2022  AST 23 02/26/2022  ALKPHOS 91 02/26/2022  BILITOT 0.2 02/26/2022  12/2019ANA	1:1280 nuclear, multiple nuclear dotsdsDNA	Negative Scl-70 	Negative SSA	NegativeSSB	NegativeJo-1 	NegativeCCP 	<16RF IgM 	10ANCA	NegativeSm Ab	NegativeMito Ab	Negative C3	161C4	4212/19/2019 [place] abdomen pelvisThe liver, gallbladder, spleen, pancreas, and adrenal glands are unremarkable, except for tiny hepatic cysts. There are multiple bilateral renal cysts and hypodensities too small to characterize, including a 2.0 cm lesion in the right kidney that likely represents a cyst with new hemorrhagic/proteinaceous debris (image 35, series 2).?There is no bowel obstruction, ascites, or lymphadenopathy.?No aggressive osseous lesions are seen.IMPRESSION: No evidence of metastatic disease.SummaryMerashe Evans is a 57 y.o. 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 She returns today, noting ongoing palpitations s/p Cardiology evaluation, noted to unlikely to have cardiac sarcoid given no evidence of conduction disease, arrhythmia, or syncope/heart failure symptoms. She last saw Pulmonary 04/2022, recommended for PFTs in one year, with no indication for treatment. She is having fatigue. She notes MSK symptoms in her left mid-back that is suggestive of mechanical etiologies, other joints are overall stable. She is also using topical treatment for rash.  At this time, she was encouraged to schedule followup with Dermatology to evaluate for active skin in the setting of sarcoid, as there is no indication for immunosuppression in the setting of pulmonary and cardiac systems. Her MSK symptoms seem more from mechanical etiologies (normal CK, aldolase, ESR 21, CRP <10) for which we will start trial of diclofenac topical. We will continue to monitor clinically.RecommendationsUCTDSarcoid lymphadenopathyRestriction on PFTsNo parenchymal involvementNo cardiac involvementLabs to monitor disease activity and medications: at next visitMonitor off DMARDRashDermatology followupMid-back painTrial of diclofenac topicalPatient education/counseling RE: UCTD, sarcoidFollow up: The patient knows to call with any concerns before the next appointment. Next visit: 3 Lorelee Cover, MDYale Rheumatology [email or web address]/[email or web address]/[email or web address]

## 2022-05-20 ENCOUNTER — Encounter: Admit: 2022-05-20 | Payer: MEDICARE | Attending: Rheumatology | Primary: Internal Medicine

## 2022-06-01 ENCOUNTER — Inpatient Hospital Stay: Admit: 2022-06-01 | Discharge: 2022-06-01 | Payer: PRIVATE HEALTH INSURANCE | Primary: Internal Medicine

## 2022-06-09 ENCOUNTER — Inpatient Hospital Stay: Admit: 2022-06-09 | Discharge: 2022-06-09 | Payer: PRIVATE HEALTH INSURANCE | Primary: Internal Medicine

## 2022-06-16 ENCOUNTER — Encounter: Admit: 2022-06-16 | Payer: MEDICARE | Attending: Vascular and Interventional Radiology | Primary: Internal Medicine

## 2022-06-18 ENCOUNTER — Encounter: Admit: 2022-06-18 | Payer: PRIVATE HEALTH INSURANCE | Attending: Ophthalmology | Primary: Internal Medicine

## 2022-06-23 ENCOUNTER — Encounter: Admit: 2022-06-23 | Payer: PRIVATE HEALTH INSURANCE | Attending: Cardiovascular Disease | Primary: Internal Medicine

## 2022-06-23 ENCOUNTER — Ambulatory Visit: Admit: 2022-06-23 | Payer: MEDICARE | Attending: Cardiovascular Disease | Primary: Internal Medicine

## 2022-06-23 DIAGNOSIS — N189 Chronic kidney disease, unspecified: Secondary | ICD-10-CM

## 2022-06-23 DIAGNOSIS — K200 Eosinophilic esophagitis: Secondary | ICD-10-CM

## 2022-06-23 DIAGNOSIS — I341 Nonrheumatic mitral (valve) prolapse: Secondary | ICD-10-CM

## 2022-06-23 DIAGNOSIS — Z87828 Personal history of other (healed) physical injury and trauma: Secondary | ICD-10-CM

## 2022-06-23 DIAGNOSIS — E785 Hyperlipidemia, unspecified: Secondary | ICD-10-CM

## 2022-06-23 DIAGNOSIS — Q613 Polycystic kidney, unspecified: Secondary | ICD-10-CM

## 2022-06-23 DIAGNOSIS — I340 Nonrheumatic mitral (valve) insufficiency: Secondary | ICD-10-CM

## 2022-06-23 DIAGNOSIS — K219 Gastro-esophageal reflux disease without esophagitis: Secondary | ICD-10-CM

## 2022-06-23 DIAGNOSIS — I10 Essential (primary) hypertension: Secondary | ICD-10-CM

## 2022-06-23 DIAGNOSIS — R002 Palpitations: Secondary | ICD-10-CM

## 2022-06-23 DIAGNOSIS — D573 Sickle-cell trait: Secondary | ICD-10-CM

## 2022-06-23 DIAGNOSIS — E559 Vitamin D deficiency, unspecified: Secondary | ICD-10-CM

## 2022-06-23 NOTE — Patient Instructions
Ms. Koscinski, OK to stop metoprololCan try natural things like magnesium over the counter and breathing exercises to see if that improves palpitations.Echocardiogram prior to next appointment.I recommend starting to take Crestor because your cholesterol levels are very high. That puts you at risk for heart attack and stroke. Juicing alone unlikely to change this.I will see you again in 9 months with results or sooner, if changes or new symptoms (like worsening shortness of breath with activity, chest pain, etc. Please feel free to call me with questions and/or concerns.-Dr. Reece Agar

## 2022-06-23 NOTE — Progress Notes
[employer] of MedicineSection of Cardiovascular MedicineYale Cardiomyopathy ProgramReferring Provider:Gallegos Kattan MDSubjective: Chief Complaint:   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  She also has a diagnosis of undifferentiated connective tissue disease managed by Dr. Annye Rusk and untreated hyperlipidemia.Please refer to my initial note from 03/03/2022. At that time, patient reported daytime fatigue and SOB.  She had an echocardiogram in March of 2022 showing normal biventricular size and function with LVEF of 69%, elevated right atrial pressure, moderate mitral regurgitation with posteriorly directed jet, and no significant pericardial effusion.  She had an event monitor in March of 2022, showing a short atrial run, PACs, and PVCs, less than 1%.  Eventually, she was transitioned to metoprolol succinate but she does not feel this has helped either.  She feels that her sensation of palpitations has changed since last year.  She feels that whenever she experiences nausea and belching, she has sensation of her heart fluttering.  Again, she remains without syncope or near-syncope.  She experiences musculoskeletal chest pain when she lifts things, but no exertional chest pain, pressure, or other anginal equivalents. 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 Degree of MR was slightly better than previous year. Holter monitor showed <1% burden of PVCs/SVE, with some SVT. NT-proBNP was negative. LDL, LP (a) and APoB were elevated. She was started on Crestor 10 mg daily.She returns today 06/23/2022 for follow-up. She feels well. She feel like the palpitations are related to when she eats certain foods. She'd like to come off metoprolol because she does not feel it helps. We discussed trying magnesium. She also reports MSK pain in her back that is worse with movement or palpation. Denies Denies exertional chest pain/pressure, epigastric pain, lightheadedness, near-syncope, syncope, claudication, orthopnea, PND, lower extremity edema. She is not taking Crestor currently.Medical History: Reviewed and updated is the past medical history, past surgical history, and past social history.  They are as follows: PMH PSH  She    has a past medical history of Chronic kidney disease, Eosinophilic esophagitis (08/26/2020), GERD (gastroesophageal reflux disease), History of torn meniscus of right knee (02/2022), Hypertension, Mitral valve regurgitation, Palpitations, Polycystic kidney disease, Sickle cell trait (HC Code) (HC CODE) (HC Code), and Vitamin D deficiency. She  has a past surgical history that includes Tubal ligation; LEEP; Dilation and curettage of uterus; laparoscopy; Tonsillectomy; Hysterectomy (08/30/2017); Appendectomy (1984); Colonoscopy; and Upper gastrointestinal endoscopy. Social History Family History She  reports that she has never smoked. She has been exposed to tobacco smoke. She has never used smokeless tobacco. She reports that she does not currently use alcohol. She reports that she does not use drugs. Family History Problem Relation Age of Onset ? High cholesterol Mother  ? Hypertension Mother  ? Stroke Mother  ? Diabetes Father  ? Kidney disease Father  ? Hyperthyroidism Sister  ? Diabetes Brother  ? Hypertension Brother  ? Diabetes Brother  ? Breast cancer Maternal Grandmother  ? Asthma Son  ? Ovarian cancer Maternal Aunt  ? Heart attack Paternal Uncle   Medications Reviewed and updated is the medication list:Current Medications Medication Sig ? metoprolol succinate XL (TOPROL-XL)  25 mg 24 hr tablet Take 1 tablet (25 mg total) by mouth daily. Take with or immediately following a meal.  Allergies She is allergic to penicillins, sulfa (sulfonamide antibiotics), sulfamethoxazole-trimethoprim, sulfasalazine, and moxifloxacin. Review of Systems: ROSThe remainder of the 12 point review of systems was reviewed with the patient and is negative.Objective: Vital Signs:Ht 5' 5 (1.651 m) Comment: per pt - Wt 76.7 kg Comment: per pt - LMP 08/30/2017 (Exact Date) Comment: hyst 08/30/2017 - BMI 28.12 kg/m?  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  She also has a diagnosis of undifferentiated connective tissue disease managed by Dr. Annye Rusk and untreated hyperlipidemia.She is doing well from the cardiovascular perspective. Based on current testing, I doubt there is cardiac sarcoidosis involvement. Furthermore, she has negative NT-proBNP, so her intermittent fatigue is not heart failure. She has not exertional chest pain or other symptoms that would be concerning for anginal equivalent and CAD. [place] chest has no coronary calcifications. However I provided written information about symptoms that should prompt earlier evaluation. Her lipids are elevated and she was recommended to take Crestor 10 mg daily and I have encouraged her today to do so.She has borderline mitral valve prolapse and I will repeat an echocardiogram later this year.She feels like her palpitations are limited now. She does not want to take metoprolol. Holter showed ectopy but no sustained arrhythmias. I recommend trying OTC magnesium and see if that helps. Otherwise, no other changes. I will see her again in person with echocardiogram results in 6-9 months, sooner if changes or new symptoms.I encouraged patient to call me with questions or concerns.40 minutes of my time was spent during this evaluation.  Lucillie Garfinkel, MD MHSAssistant Professor of Medicine (Cardiology)[place] Cardiomyopathy ProgramYale Heart and [facility]ascular [facility] of MedicineSection of Cardiovascular MedicineNew [place] Office: [phone] Office: [phone] Lebron Conners Kattan2/14/2024 3:37 PM

## 2022-06-25 ENCOUNTER — Inpatient Hospital Stay: Admit: 2022-06-25 | Discharge: 2022-06-25 | Payer: MEDICARE | Primary: Internal Medicine

## 2022-06-25 DIAGNOSIS — R109 Unspecified abdominal pain: Secondary | ICD-10-CM

## 2022-06-25 DIAGNOSIS — I10 Essential (primary) hypertension: Secondary | ICD-10-CM

## 2022-06-25 LAB — URINALYSIS-MACROSCOPIC W/REFLEX MICROSCOPIC
BKR BILIRUBIN, UA: NEGATIVE
BKR BLOOD, UA: NEGATIVE
BKR GLUCOSE, UA: NEGATIVE
BKR KETONES, UA: NEGATIVE
BKR LEUKOCYTE ESTERASE, UA: NEGATIVE
BKR NITRITE, UA: NEGATIVE
BKR PH, UA: 6 (ref 5.5–7.5)
BKR PROTEIN, UA: NEGATIVE
BKR SPECIFIC GRAVITY, UA: 1.014 (ref 1.005–1.030)
BKR UROBILINOGEN, UA (MG/DL): <2 mg/dL (ref <=2.0)

## 2022-06-25 NOTE — Other
UA unremarkable, no blood

## 2022-06-26 ENCOUNTER — Encounter: Admit: 2022-06-26 | Payer: PRIVATE HEALTH INSURANCE | Primary: Internal Medicine

## 2022-06-27 LAB — HEMOGLOBIN A1C
BKR ESTIMATED AVERAGE GLUCOSE: 128 mg/dL
BKR HEMOGLOBIN A1C: 6.1 % — ABNORMAL HIGH (ref 4.0–5.6)

## 2022-06-29 ENCOUNTER — Inpatient Hospital Stay: Admit: 2022-06-29 | Discharge: 2022-06-29 | Payer: PRIVATE HEALTH INSURANCE | Primary: Internal Medicine

## 2022-06-29 NOTE — Other
A1c 6.1, which is in prediabetes range and slightly higher than 5.7 over a year ago. Please advise patient to engage in lifestyle modifications, including healthy diet low in sugary foods/drinks and exercise.

## 2022-07-09 ENCOUNTER — Inpatient Hospital Stay: Admit: 2022-07-09 | Discharge: 2022-07-09 | Payer: MEDICARE | Primary: Internal Medicine

## 2022-07-09 DIAGNOSIS — M79643 Pain in unspecified hand: Secondary | ICD-10-CM

## 2022-07-12 NOTE — Other
Reviewed X-ray results, notable for mild CMC OA but no other erosive or degenerative changes. Called pt to discuss; still notes b/l hand pain/stiffness in the morning. Given hx of autoimmune disease, advised her to f/u with her rheumatologist for next steps. Also forwarding results to PCP.Cathy Evans PGY3Yale Internal MedicineBest contact: MHB

## 2022-07-23 ENCOUNTER — Ambulatory Visit: Admit: 2022-07-23 | Payer: MEDICARE | Primary: Internal Medicine

## 2022-07-29 ENCOUNTER — Inpatient Hospital Stay: Admit: 2022-07-29 | Discharge: 2022-07-29 | Payer: MEDICARE | Primary: Internal Medicine

## 2022-07-29 ENCOUNTER — Ambulatory Visit: Admit: 2022-07-29 | Payer: MEDICARE | Primary: Internal Medicine

## 2022-07-29 DIAGNOSIS — M25551 Pain in right hip: Secondary | ICD-10-CM

## 2022-08-16 ENCOUNTER — Inpatient Hospital Stay: Admit: 2022-08-16 | Discharge: 2022-08-16 | Payer: MEDICARE | Primary: Internal Medicine

## 2022-08-16 DIAGNOSIS — R0600 Dyspnea, unspecified: Secondary | ICD-10-CM

## 2022-08-17 NOTE — Other
CXR looks normal. Clinical Support can you let the pt know and that she should see pulm ASAP?

## 2022-08-18 ENCOUNTER — Inpatient Hospital Stay: Admit: 2022-08-18 | Discharge: 2022-08-18 | Payer: PRIVATE HEALTH INSURANCE | Primary: Internal Medicine

## 2022-08-23 ENCOUNTER — Ambulatory Visit
Admit: 2022-08-23 | Payer: MEDICARE | Attending: Student in an Organized Health Care Education/Training Program | Primary: Internal Medicine

## 2022-08-23 ENCOUNTER — Encounter
Admit: 2022-08-23 | Payer: PRIVATE HEALTH INSURANCE | Attending: Student in an Organized Health Care Education/Training Program | Primary: Internal Medicine

## 2022-08-23 DIAGNOSIS — D869 Sarcoidosis, unspecified: Secondary | ICD-10-CM

## 2022-08-23 MED ORDER — FLUTICASONE PROPIONATE 50 MCG/ACTUATION NASAL SPRAY,SUSPENSION
50 mcg/actuation | Freq: Every day | NASAL | 12 refills | Status: AC
Start: 2022-08-23 — End: ?

## 2022-08-23 NOTE — Progress Notes
[facility] Department of Internal Medicine Section of Pulmonary, Critical Care, and Sleep Medicine	[facility] [address]  [place], [place] [address]UEAVW:  [phone]  Fax: [phone], Glo Herring, MD, PhD				Medical Director, Milas Hock, MD			Associate Director, Stark Klein, MD, [facility] Ginnie Smart, MD, MScJean Rhina Brackett, MDGenta Camillia Herter, MD, MPHChangwan Elizbeth Squires, MD, MPHJonathan Siner, MDKelly Margaretmary Eddy, APRNLisa Rebmann, RNChristine Tancreti, RNPatient Name:  Cathy Evans of Birth:  1967-01-22Date of Service: 4/15/2024EPIC MRN:  [id number] Provider:  Fletcher Anon Cerro-Chiang, MDI had the pleasure of seeing Cathy Evans in follow up for sarcoidosis. She was found to have mediastinal adenopathies on [place] chest as part of the workup for a neck mass. 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 At our first visit, she had some CTD related symptoms as well as palpitations. She had a Holter monitor for 2 weeks which was unrevealing. 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 LAST VISIT: She reports head and toe pain in addition to a general feeling of tightness in her body. The sensation worsens throughout the day and it worst at night. Her rheumatologist advised her to restart Plaquenil but the patient deferred in favor of herbal remedies. She is scheduled to see a Rheumatologist soon. She reports some episodes shortness of breath that she attributed to anxiety but denies any cough.CHART REVIEW:05/17/22 Rheumatology Annye Rusk): Monitoring off DMARD10/25/23 Cardiology [facility]):  No evidence of conduction disease, arrhythmia or syncope. Repeat Holter was reasuring, had some SVT episodes but on metoprolol. Repeat echo with normal size and function. CURRENT VISIT:Today, Cathy Evans says that she has been experiencing shortness of breath, primarily located in the middle of her chest. She also notes discomfort in her throat, and endorses a cough that accompanies her shortness of breath. She says that this shortness of breath occurs when she is laying down or doing physical activity. During PT for her back, she says that when she is lying in supine position and brings her knees to her chest, she feels like her breathing is being cut off.She says that she feels her breathing has worsened since October 2023. She notes that her shortness of breath was more intermittent in October 2023, but it has now become constant.She denies heartburn and acid reflux. She has some nasal congestionMEDICATIONS: Current Outpatient Medications:   cholecalciferol (vitamin D3), Take 1 tablet (1,000 Units total) by mouth daily. (Patient not taking: Reported on 06/23/2022)  meloxicam, Take 1 tablet (7.5 mg total) by mouth daily. (Patient not taking: Reported on 08/02/2022)  metoprolol succinate XL, Take 1 tablet (25 mg total) by mouth daily. Take with or immediately following a meal. (Patient not taking: Reported on 08/02/2022)  nortriptyline, Take 1 capsule (10 mg total) by mouth nightly. (Patient not taking: Reported on 08/02/2022)  rosuvastatin, Take 1 tablet (10 mg total) by mouth daily. (Patient not taking: Reported on 05/17/2022)ALLERGIES:Allergies Allergen Reactions  Penicillins Hives   Hives Has patient had a PCN reaction causing immediate rash, facial/tongue/throat swelling, SOB or lightheadedness with hypotension: YESHas patient had a PCN reaction causing severe rash involving mucus membranes or skin necrosis: NOHas patient had a PCN reaction that required hospitalization NOHas patient had a PCN reaction occurring within the last 10 years: NOIf all of the above answers are NO, then may proceed with Cephalosporin use.  Sulfa (Sulfonamide Antibiotics) Hives and Rash Sulfamethoxazole-Trimethoprim Hives  Sulfasalazine Hives   rashrash  Moxifloxacin Rash PAST MEDICAL HISTORY:Past Medical History: Diagnosis Date  Chronic kidney disease  PKD, but normal creatinine  Eosinophilic esophagitis 08/26/2020  GERD (gastroesophageal reflux disease)   History of torn meniscus of right knee 02/2022  Hypertension   Mitral valve regurgitation   Palpitations   Polycystic kidney disease   Sickle cell trait (HC Code) (HC CODE) (HC Code)   Natera testing 2021  Vitamin D deficiency  Past Surgical History: Procedure Laterality Date  APPENDECTOMY  1984  COLONOSCOPY    DILATION AND CURETTAGE OF UTERUS    HYSTERECTOMY  08/30/2017  LAPAROSCOPY    LEEP    TONSILLECTOMY    TUBAL LIGATION    UPPER GASTROINTESTINAL ENDOSCOPY   PHYSICAL EXAMINATION:Deferred as this was a video visitREVIEW OF DATA:Pulmonary Function Testing: Date FVC FEV1 FEV1/FVC TLC DLCO  01/21/2022 2.46 (73%) 1.96 (73%) 80% 3.44 (66%) 15.95 (78%)  Personal Interpretation:  No obstruction, mild restriction, normal DLCOChest Radiology: 10/18/21 [place] ChestLungs/Airways/Pleura: Central tracheobronchial tree is patent. Minimal tree-in-bud and groundglass opacities in the right lower lobe, likely infectious or inflammatory etiology (4:367). No reticulations, bronchiectasis, honeycombing, architectural distortion or other evidence of interstitial lung disease. No suspicious pulmonary nodules or masses. No pleural effusion or pneumothorax. Mediastinum/Lymph nodes: Left subclavicular, superior mediastinal, right paratracheal, prevascular, bilateral hilar and subcarinal adenopathy. Largest nodes measure up to 14 mm in the subcarinal region. Other Radiology/Studies:[place] [facility] 6/23/23The liver, gallbladder, spleen, pancreas, and adrenal glands are unremarkable, except for tiny hepatic cysts.  Again seen are multiple bilateral renal cysts and hypodensities too small to characterize, including a 2.9 cm lesion in the right kidney that likely represents a cyst with new hemorrhagic/proteinaceous debris (image 39, series 2) that has increased from 2.0 cm. There is no bowel obstruction, ascites, or lymphadenopathy. No aggressive osseous lesions are seen.  Pathology:7/11/23Lung, right mainstem, endobronchial biopsy:       - Superficial fragments of bronchial mucosa with no significant abnormality.  FNA lung1.  RIGHT MIDDLE LOBE BRONCHOALVEOLAR LAVAGE:      - NEGATIVE FOR MALIGNANT CELLS.      - REACTIVE PULMONARY MACROPHAGES.      - GMS STAIN IS NEGATIVE FOR FUNGUS/PJP.      - AFB STAIN IS NEGATIVE FOR MYCOBACTERIA. 2.  LYMPH NODE, 4R, FINE NEEDLE ASPIRATION/WASH:      - NEGATIVE FOR MALIGNANT CELLS.           - REACTIVE LYMPHOCYTES AND FOCAL LYMPHOHISTIOCYTIC AGGREGATES. 3.  LYMPH NODE, 10R, FINE NEEDLE ASPIRATION/WASH:      - NEGATIVE FOR MALIGNANT CELLS.           - REACTIVE LYMPHOCYTES AND FOCAL LYMPHOHISTIOCYTIC AGGREGATES. 4.  LYMPH NODE, 4L, FINE NEEDLE ASPIRATION/WASH:      - NEGATIVE FOR MALIGNANT CELLS.      - FOCAL NON-CASEATING GRANULOMATOUS INFLAMMATION.      - GMS STAIN IS NEGATIVE FOR FUNGUS/PJP.      - AFB STAIN IS NEGATIVE FOR MYCOBACTERIA.  Cardiac studies:07/11/20 * Normal left ventricular size, wall thickness, systolic function and wall motion. LVEF calculated by 3DE was 69%.  Diastolic function was difficult to determine due to mitral valve abnormalities.* Normal right ventricular cavity size and systolic function.  Right ventricular systolic pressure is unable to be estimated due to insufficient Doppler signal.* Atria are normal in size.  No interatrial shunt by color Doppler.  Interatrial septum is bowed toward the left, consistent with elevated right atrial pressure.* Normal mitral valve leaflets.  Moderate posteriorly directed mitral regurgitation. By PISA, EROA PISA is 0.24 cm2 and regurgitant volume 46 ml.* All visible segments of the  aorta are normal in size.* IVC diameter < 2.1 cm that collapses > 50% with a sniff suggests normal RAP (0-5 mmHg, mean 3 mmHg).* No significant pericardial effusion.* No prior study available for comparison. Labs:10/2021:CRP 5.1, ESR 34Neg ACE, RFNormal complementIgG: 1625, remainder unremarkableANA > 1:2560 (nuclear dots) 1:160 speckled. DsDNA, RNP, SSA, SSB, Smith, all negative 12/2019ANA     1:1280 nuclear, multiple nuclear dotsdsDNA Negative Scl-70  Negative SSA     NegativeSSB     NegativeJo-1     NegativeCCP     <16RF IgM            10ANCA  NegativeSm Ab NegativeMito Ab            Negative C3        161C4        42IMPRESSION:Perl Biondo is a 57 y.o. 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 On [place] scan she has no evidence of parenchymal involvement from sarcoidosis.  She is evidence of restriction on pulmonary function tests and I believe these could be due to neuromuscular weakness given that her parenchymal findings are quite minimal. RECOMMENDATIONS:SarcoidosisPathology: EBUS with FNA showing non caseating granulomasOrgan involvement: LADsPrimary target organ for therapy: none currentlyCurrent treatment: nonePast treatments: noneFurther work-up:[place] to evaluate her parenchyma given worsening dyspnea and cough although I think this is all related to PNDStart daily sinus rinses followed by flonase Please do not hesitate to call me if you would like to discuss Ms. Goeden's care.Sincerely,Hala Narula Ginnie Smart, MD, MScInstructorYale Interstitial Lung Disease CenterSection of Pulmonary and Critical Care MedicineYale School of MedicineOn the day of this patient's encounter, a total of 32 minutes was personally spent by me, which includes time spent on chart and records review, review of radiological images, medical consultation, education, coordination of care/services , counseling and chart documentation.This does not include any resident/fellow teaching time, or any time spent performing a procedural service.Scribed for Cerro-Chiang, Feliciana Rossetti, MD by Champ Mungo, medical scribe April 15, 2024The documentation recorded by the scribe accurately reflects the services I personally performed and the decisions made by me. I reviewed and confirmed all material entered and/or pre-charted by the scribe. The documentation recorded by the scribe accurately reflects the services I personally performed and the decisions made by me. I reviewed and confirmed all material entered and/or pre-charted by the scribe.VIDEO TELEHEALTH VISIT: This clinician is part of the telehealth program and is conducting this visit in a currently approved location. For this visit the clinician and patient were present via interactive audio & video telecommunications system that permits real-time communications, via the [employer].Patient's use of the telehealth platform followed consent and acknowledges agreement to permit telehealth for this visit. State patient is located in: [place]The clinician is appropriately licensed in the above state to provide care for this visit. Other individuals present during the telehealth encounter and their role/relation: noneBecause this visit was completed over video, a hands-on physical exam was not performed. Patient/parent or guardian understands and knows to call back if condition changes.

## 2022-08-30 ENCOUNTER — Inpatient Hospital Stay: Admit: 2022-08-30 | Discharge: 2022-08-30 | Payer: MEDICARE | Primary: Internal Medicine

## 2022-08-30 DIAGNOSIS — D869 Sarcoidosis, unspecified: Secondary | ICD-10-CM

## 2022-09-01 ENCOUNTER — Inpatient Hospital Stay: Admit: 2022-09-01 | Discharge: 2022-09-01 | Payer: PRIVATE HEALTH INSURANCE | Primary: Internal Medicine

## 2022-09-09 ENCOUNTER — Telehealth: Admit: 2022-09-09 | Payer: PRIVATE HEALTH INSURANCE | Attending: Rheumatology | Primary: Internal Medicine

## 2022-09-09 NOTE — Telephone Encounter
Completed faxed and placed to scan.Marylene Land RN

## 2022-09-09 NOTE — Telephone Encounter
[facility] MESSAGETime of call:   11:31 AMReason for call:   Patient is needing a medical hardship form filled out for [employer]. They will be sending something from [employer] for a special accommodations form.Provider:  Dr. Madelon Lips clinic visit date:  1/8/24Best telephone number for callback:   [phone] time to return call (encourage patient to be available for callback):   Any TimePermission to leave message:  yes   [facility] Scheduler

## 2022-09-14 ENCOUNTER — Encounter: Admit: 2022-09-14 | Payer: MEDICARE | Attending: Rheumatology | Primary: Internal Medicine

## 2022-09-16 ENCOUNTER — Encounter: Admit: 2022-09-16 | Payer: PRIVATE HEALTH INSURANCE | Attending: Rheumatology | Primary: Internal Medicine

## 2022-09-16 ENCOUNTER — Encounter: Admit: 2022-09-16 | Payer: MEDICARE | Attending: Rheumatology | Primary: Internal Medicine

## 2022-09-16 DIAGNOSIS — D869 Sarcoidosis, unspecified: Secondary | ICD-10-CM

## 2022-09-16 DIAGNOSIS — I340 Nonrheumatic mitral (valve) insufficiency: Secondary | ICD-10-CM

## 2022-09-16 DIAGNOSIS — I10 Essential (primary) hypertension: Secondary | ICD-10-CM

## 2022-09-16 DIAGNOSIS — Q613 Polycystic kidney, unspecified: Secondary | ICD-10-CM

## 2022-09-16 DIAGNOSIS — Z87828 Personal history of other (healed) physical injury and trauma: Secondary | ICD-10-CM

## 2022-09-16 DIAGNOSIS — K200 Eosinophilic esophagitis: Secondary | ICD-10-CM

## 2022-09-16 DIAGNOSIS — N189 Chronic kidney disease, unspecified: Secondary | ICD-10-CM

## 2022-09-16 DIAGNOSIS — D573 Sickle-cell trait: Secondary | ICD-10-CM

## 2022-09-16 DIAGNOSIS — R002 Palpitations: Secondary | ICD-10-CM

## 2022-09-16 DIAGNOSIS — K219 Gastro-esophageal reflux disease without esophagitis: Secondary | ICD-10-CM

## 2022-09-16 DIAGNOSIS — E559 Vitamin D deficiency, unspecified: Secondary | ICD-10-CM

## 2022-09-16 NOTE — Patient Instructions
Labs MRI spine

## 2022-09-16 NOTE — Progress Notes
Review of Systems   Constitutional: Negative.    HENT: Negative.    Eyes: Negative.    Respiratory: Negative.    Cardiovascular: Negative.    Gastrointestinal: Negative.    Genitourinary: Negative.    Musculoskeletal: Negative.    Skin: Negative.    Neurological: Negative.    Endo/Heme/Allergies: Negative.    Psychiatric/Behavioral: Negative.

## 2022-09-16 NOTE — Progress Notes
VIDEO TELEHEALTH VISIT: This clinician is part of the telehealth program and is conducting this visit in a currently approved location. For this visit the clinician and patient were present via interactive audio & video telecommunications system that permits real-time communications, via the [employer].Patient's use of the telehealth platform followed consent and acknowledges agreement to permit telehealth for this visit. State patient is located in: [place]The clinician is appropriately licensed in the above state to provide care for this visit. Other individuals present during the telehealth encounter and their role/relation: noneBecause this visit was completed over video, a hands-on physical exam was not performed. Patient/parent or guardian understands and knows to call back if condition changes.[facility] RheumatologyDiagnosis: Undifferentiated Connective Tissue Disease Serologies: +ANA 1:2560 nuclear dots; negative Sm, RNP, SSA, SSB, dsDNALA not detected, B2GP, ACL Ab negativeCCP negative, RF negativeUrine Prot/Cr ratio 0.06C3 140, C4 : alopecia, arthralgias, dry eyes, dry mouthPast medications: hydroxychloroquine 200 mg BIDCurrent therapy: noneHPI from 08/2018:Ms Marban presents for initial visit, never saw a rheumatologist in the past.Shaconda Flannigan is a 57 y.o. female with PMHx notable for HTN, polycystic kidney disease, who presents for initial visit. She was referred by her gastroenterologist Dr. Vesta Mixer for concern of positive ANA, found as part of evaluation for dysphagia. She has underwent EGD and colonoscopy fall 2019 but notes symptoms persist despite initiation of PPI. She reports fatigue, hair thinning, episode of oral ulcers, along with arthralgia and myalgia, pregnancy loss at 3 months and possible issue with her platelets in the past. 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 Pt encouraged to use MyChart to let us know if she has any questions or concerns.Interval Change:--Seen 12/2018, we discussed how her presentation may be suggestive of an evolving UCTD for which we discussed starting HCQ (AEs discussed) to which she agreed. She will start HCQ 200 mg daily and schedule Ophthalmology visit with [facility]. She will return for followup in 3 months or earlier PRN.--Seen 05/22/2019, on HCQ 200 mg daily that was started after her last visit, noting overall improvement, particularly in her MSK symptoms. She still endorses sicca, hair thinning, and fatigue; she describes some mild hand symptoms that are suggestive of inflammatory arthritis. We discussed continuing to monitor on HCQ 200 mg daily--she is up to date on Ophthalmology evaluation and has VF testing next week. We will see her for followup in 3-4 months.  --Seen 08/28/2019, on HCQ 200 mg daily, noting some MSK symptoms that are seem more mechanical in etiology. She also continues to have hair thinning, dry mouth, and some dysphagia. Her last GI visit was in early 2020 after endoscopies in 2019; I encouraged her to followup with GI prior to considering barium swallow study. At this time, we discussed scheduling an in-person visit and will plan to repeat labs before her followup in 3-4 months. 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 --Seen 12/03/2021, for her first in-person visit with me. She was started on HCQ but returns today after she self-DCd one year prior. Recently, clinical course c/b parotitis and thoracic adenopathy, now undergoing [place] abdomen/pelvis and evaluation by Oncology. Mammogram and ultrasound notable for BI-RAD2. She also describes fatigue, sicca, dysphagia, cramping abdominal pain. At this time, we discussed awaiting possible biopsy for further elucidation of underlying process--we will touch base after workup for thoracic adenopathy. In the interim, we will repeat serologies. We  will hold on resuming HCQ for now. --Seen 02/11/2022, with palpitations and going to see Cardiology next month--also recommended to schedule PET myocardial for sarcoid involvement. She continues to have some SOB and noted to have restriction on PFTs concerning for neuromuscular involvement so myositis panel was ordered. At this time, we discussed how it is unclear if her palpitations are related to sarcoid and we will await additional workup. We will also check muscle enzymes along with monitoring labs. In the interim, she was encouraged to restart HCQ, which she is not taking. --Seen 05/17/2022, noting ongoing palpitations s/p Cardiology evaluation, noted to unlikely to have cardiac sarcoid given no evidence of conduction disease, arrhythmia, or syncope/heart failure symptoms. She last saw Pulmonary 04/2022, recommended for PFTs in one year, with no indication for treatment. She is having fatigue. She notes MSK symptoms in her left mid-back that is suggestive of mechanical etiologies, other joints are overall stable. She is also using topical treatment for rash.  At this time, she was encouraged to schedule followup with Dermatology to evaluate for active skin in the setting of sarcoid, as there is no indication for immunosuppression in the setting of pulmonary and cardiac systems. Her MSK symptoms seem more from mechanical etiologies (normal CK, aldolase, ESR 21, CRP <10) for which we will start trial of diclofenac topical. We will continue to monitor clinicallyToday:She is off statin, metoprolol--says she discussed with Cardiology. She gets some SOB upon exertion. No chest pain.She describes worsening back pain that worsens with activity. She takes Tylenol and cyclobenzaprine as needed. Other joints stable. She tried to participate in PT, now referred to aquatherapy.Review of Systems (negative except as noted; positive findings bold)Constitutional: weight changes, fatigue, malaise, fever, chills, sweatsSkin: rashes, photosensitivity, hives, easy bruisability, alopecia, scalp tenderness, skin nodules or psoriasis Eyes:  pain, redness, itching, visual blurring, dryness, foreign body sensationENT: tinnitus, hearing loss, sinus congestion, loss of smell, dry nose, bloody nose, jaw claudication, oral ulcers, loss of taste, dry mouth, hoarsenessCardiac: chest pain, palpitations, irregular heartbeat, exertional dyspneaVascular: Raynaud?s, chilblains, frostbite, venous stasis, thrombosisPulmonary: shortness of breath, cough, wheeze, hemoptysis, chest wall pain, pleuritic chest pain, current tobacco use GI: difficulty swallowing, nausea, vomiting, GERD, ulcers, constipation, diarrhea, change in bowel habits, abdominal pain, liver diseaseGU: genital ulcers, dysuria, blood in urine, nocturia, infections, kidney stonesMusculoskeletal: morning stiffness, neck pain, back pain, joint pain, joint swelling, muscle aching or tenderness, muscle weaknessNeurological: numbness, tingling, headaches, fainting, dizziness, imbalance, memory loss, seizure, strokeHeme/Lymph: swollen or tender glands, anemiaPsychiatric:  anxiety, irritability, depression, sleep disturbancePast Medical History:Past Medical History: Diagnosis Date  Chronic kidney disease   PKD, but normal creatinine  Eosinophilic esophagitis 08/26/2020  GERD (gastroesophageal reflux disease)   History of torn meniscus of right knee 02/2022  Hypertension   Mitral valve regurgitation   Palpitations   Polycystic kidney disease   Sickle cell trait (HC Code) (HC CODE) (HC Code)   Natera testing 2021  Vitamin D deficiency  Past Surgical History: Procedure Laterality Date  APPENDECTOMY  1984  COLONOSCOPY    DILATION AND CURETTAGE OF UTERUS    HYSTERECTOMY  08/30/2017  LAPAROSCOPY    LEEP    TONSILLECTOMY TUBAL LIGATION    UPPER GASTROINTESTINAL ENDOSCOPY   Allergy:Allergies Allergen Reactions  Penicillins Hives   Hives Has patient had a PCN reaction causing immediate rash, facial/tongue/throat swelling, SOB or lightheadedness with hypotension: YESHas patient had a PCN reaction causing severe rash involving mucus membranes or skin necrosis: NOHas patient had a PCN reaction that required hospitalization NOHas patient  had a PCN reaction occurring within the last 10 years: NOIf all of the above answers are NO, then may proceed with Cephalosporin use.  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  cholecalciferol (vitamin D3) Take 1 tablet (1,000 Units total) by mouth daily. (Patient not taking: Reported on 06/23/2022)  meloxicam Take 1 tablet (7.5 mg total) by mouth daily. (Patient not taking: Reported on 08/02/2022)  metoprolol succinate XL Take 1 tablet (25 mg total) by mouth daily. Take with or immediately following a meal. (Patient not taking: Reported on 08/02/2022)  nortriptyline Take 1 capsule (10 mg total) by mouth nightly. (Patient not taking: Reported on 08/02/2022)  rosuvastatin Take 1 tablet (10 mg total) by mouth daily. (Patient not taking: Reported on 05/17/2022) No current facility-administered medications for this visit. Lab and Imaging ResultsLab Results Component Value Date  WBC 5.9 04/20/2022  HGB 12.8 04/20/2022  HCT 39.00 04/20/2022  MCV 87.2 04/20/2022  PLT 307 04/20/2022 Lab Results Component Value Date  CREATININE 0.61 04/27/2022  BUN 14 04/27/2022  NA 141 04/27/2022  K 4.1 04/27/2022  CL 105 04/27/2022  CO2 28 04/27/2022 Lab Results Component Value Date  ALT 28 02/26/2022  AST 23 02/26/2022  ALKPHOS 91 02/26/2022  BILITOT 0.2 02/26/2022  12/2019ANA	1:1280 nuclear, multiple nuclear dotsdsDNA	Negative Scl-70 	Negative SSA	NegativeSSB	NegativeJo-1 	NegativeCCP 	<16RF IgM 	10ANCA	NegativeSm Ab	NegativeMito Ab	Negative C3	161C4	4212/19/2019 [place] abdomen pelvisThe liver, gallbladder, spleen, pancreas, and adrenal glands are unremarkable, except for tiny hepatic cysts. There are multiple bilateral renal cysts and hypodensities too small to characterize, including a 2.0 cm lesion in the right kidney that likely represents a cyst with new hemorrhagic/proteinaceous debris (image 35, series 2). There is no bowel obstruction, ascites, or lymphadenopathy. No aggressive osseous lesions are seen.IMPRESSION: No evidence of metastatic disease.08/30/2022 [place] chestSlight increase in the size of mediastinal adenopathy compared to study from June 2023. No change in lung findings including a few perilymphatic nodules. No significant bronchiectasis or architectural distortion in the lungs. SummaryMerashe Evans is a 57 y.o. 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 She returns today, noting SOB upon exertion, s/p repeat [place] chest 08/2022 that showed hilar adenopathy but no change in lung findings, Pulmonary team recommended treatment for post nasal drip, no systemic treatment for sarcoid. She continues to have low back pain despite Tylenol PRN, cyclobenzaprine PRN. She had worsened symptoms with PT and now referred to aquatherapy. Other joints okay. At this time, we discussed how her low back symptoms are most likely mechanical but will proceed with MRI lumbar spine to exclude sarcoid involvement--we will also repeat labs this summer. Otherwise, no indication for immunosuppression for her pulmonary and cardiac systems. RecommendationsUCTDSarcoid lymphadenopathyRestriction on PFTsNo parenchymal involvementNo cardiac involvementLabs to monitor disease activity and medications: Orders Placed This Encounter Procedures  C-reactive protein (CRP)  Sedimentation rate (ESR)  ANGIOTENSIN CONVERTING ENZYME  CBC and differential  Comprehensive metabolic panel Monitor off DMARDLow back pain, worseningStart meloxicam 15 mg daily for 2 weeksContinue Tylenol PRNAquatherapy with progression to  land based exercise (Referred already)MRI L-spinePatient education/counseling RE: UCTD, sarcoidFollow up: The patient knows to call with any concerns before the next appointment. Next visit: 6 Lorelee Cover, MDY[facility] Rheumatology Marcello [email or web address]

## 2022-10-01 ENCOUNTER — Encounter: Admit: 2022-10-01 | Payer: PRIVATE HEALTH INSURANCE | Attending: Rheumatology | Primary: Internal Medicine

## 2022-10-01 ENCOUNTER — Ambulatory Visit: Admit: 2022-10-01 | Payer: MEDICARE | Attending: Cardiovascular Disease | Primary: Internal Medicine

## 2022-10-01 ENCOUNTER — Ambulatory Visit: Admit: 2022-10-01 | Payer: MEDICARE | Attending: Rheumatology | Primary: Internal Medicine

## 2022-10-01 ENCOUNTER — Inpatient Hospital Stay: Admit: 2022-10-01 | Discharge: 2022-10-01 | Payer: MEDICARE | Primary: Internal Medicine

## 2022-10-01 ENCOUNTER — Encounter: Admit: 2022-10-01 | Payer: PRIVATE HEALTH INSURANCE | Attending: Cardiovascular Disease | Primary: Internal Medicine

## 2022-10-01 DIAGNOSIS — E785 Hyperlipidemia, unspecified: Secondary | ICD-10-CM

## 2022-10-01 DIAGNOSIS — D869 Sarcoidosis, unspecified: Secondary | ICD-10-CM

## 2022-10-01 LAB — COMPREHENSIVE METABOLIC PANEL
BKR A/G RATIO: 1.1 (ref 1.0–2.2)
BKR ALANINE AMINOTRANSFERASE (ALT): 23 U/L (ref 10–35)
BKR ALBUMIN: 4 g/dL (ref 3.6–5.1)
BKR ALKALINE PHOSPHATASE: 102 U/L (ref 9–122)
BKR ANION GAP: 10 (ref 7–17)
BKR ASPARTATE AMINOTRANSFERASE (AST): 26 U/L (ref 10–35)
BKR AST/ALT RATIO: 1.1
BKR BILIRUBIN TOTAL: 0.2 mg/dL (ref <=1.2)
BKR BLOOD UREA NITROGEN: 9 mg/dL (ref 6–20)
BKR BUN / CREAT RATIO: 14.8 % (ref 8.0–23.0)
BKR CALCIUM: 9.8 mg/dL (ref 8.8–10.2)
BKR CHLORIDE: 106 mmol/L (ref 98–107)
BKR CO2: 26 mmol/L (ref 20–30)
BKR CREATININE: 0.61 mg/dL (ref 0.40–1.30)
BKR EGFR, CREATININE (CKD-EPI 2021): >60 mL/min/{1.73_m2} (ref >=60)
BKR GLOBULIN: 3.5 g/dL (ref 2.0–3.9)
BKR GLUCOSE: 111 mg/dL — ABNORMAL HIGH (ref 70–100)
BKR POTASSIUM: 4 mmol/L (ref 3.3–5.3)
BKR PROTEIN TOTAL: 7.5 g/dL (ref 5.9–8.3)
BKR SODIUM: 142 mmol/L (ref 136–144)

## 2022-10-01 LAB — CBC WITH AUTO DIFFERENTIAL
BKR WAM ABSOLUTE IMMATURE GRANULOCYTES.: 0.01 x 1000/ÂµL (ref 0.00–0.30)
BKR WAM ABSOLUTE LYMPHOCYTE COUNT.: 1.9 x 1000/ÂµL (ref 0.60–3.70)
BKR WAM ABSOLUTE NRBC (2 DEC): 0 x 1000/ÂµL (ref 0.00–1.00)
BKR WAM ANALYZER ANC: 2.42 x 1000/ÂµL (ref 2.00–7.60)
BKR WAM BASOPHIL ABSOLUTE COUNT.: 0.03 x 1000/ÂµL (ref 0.00–1.00)
BKR WAM BASOPHILS: 0.6 % (ref 0.0–1.4)
BKR WAM EOSINOPHIL ABSOLUTE COUNT.: 0.52 x 1000/ÂµL (ref 0.00–1.00)
BKR WAM EOSINOPHILS: 9.7 % — ABNORMAL HIGH (ref 0.0–5.0)
BKR WAM HEMATOCRIT (2 DEC): 37.9 % (ref 35.00–45.00)
BKR WAM HEMOGLOBIN: 12.3 g/dL (ref 11.7–15.5)
BKR WAM IMMATURE GRANULOCYTES: 0.2 % (ref 0.0–1.0)
BKR WAM LYMPHOCYTES: 35.3 % (ref 17.0–50.0)
BKR WAM MCH (PG): 28.1 pg (ref 27.0–33.0)
BKR WAM MCHC: 32.5 g/dL (ref 31.0–36.0)
BKR WAM MCV: 86.7 fL (ref 80.0–100.0)
BKR WAM MONOCYTE ABSOLUTE COUNT.: 0.5 x 1000/ÂµL (ref 0.00–1.00)
BKR WAM MONOCYTES: 9.3 % (ref 4.0–12.0)
BKR WAM MPV: 9.6 fL (ref 8.0–12.0)
BKR WAM NEUTROPHILS: 44.9 % (ref 39.0–72.0)
BKR WAM NUCLEATED RED BLOOD CELLS: 0 % (ref 0.0–1.0)
BKR WAM PLATELETS: 270 x1000/ÂµL (ref 150–420)
BKR WAM RDW-CV: 13.6 % (ref 11.0–15.0)
BKR WAM RED BLOOD CELL COUNT.: 4.37 M/ÂµL (ref 4.00–6.00)
BKR WAM WHITE BLOOD CELL COUNT: 5.4 x1000/ÂµL (ref 4.0–11.0)

## 2022-10-01 LAB — LIPID PANEL
BKR CHOLESTEROL/HDL RATIO: 4.2 mg/L — ABNORMAL HIGH (ref 0.0–5.0)
BKR CHOLESTEROL: 177 mg/dL (ref 0.00–1.00)
BKR HDL CHOLESTEROL: 42 mg/dL (ref >=40)
BKR LDL CHOLESTEROL SAMPSON CALCULATED: 123 mg/dL — ABNORMAL HIGH (ref 0–20)
BKR TRIGLYCERIDES: 63 mg/dL

## 2022-10-01 LAB — SEDIMENTATION RATE (ESR): BKR SEDIMENTATION RATE, ERYTHROCYTE: 26 mm/h — ABNORMAL HIGH (ref 0–20)

## 2022-10-01 LAB — ANGIOTENSIN CONVERTING ENZYME: BKR ANGIOTENSIN CONVERTING ENZYME: 66 U/L (ref 12–82)

## 2022-10-01 LAB — C-REACTIVE PROTEIN     (CRP): BKR C-REACTIVE PROTEIN, HIGH SENSITIVITY: 10.2 mg/L — ABNORMAL HIGH

## 2022-10-01 NOTE — Other
CRP 10.2 from 8.2ESR 26 from 21Eos 9.7 from 9.1ACE 66

## 2022-10-04 NOTE — Other
Hi MS. [place],Your LDL (bad cholesterol) levels are significantly improved, but still elevated. Would consider increase the rosuvastatin (Crestor) to 20 mg daily. Think about it and let me know what you decide.Best,Dr. Reece Agar

## 2022-10-05 ENCOUNTER — Telehealth: Admit: 2022-10-05 | Payer: PRIVATE HEALTH INSURANCE | Primary: Internal Medicine

## 2022-10-05 NOTE — Telephone Encounter
LVM for pt to call back for Dr Timoteo Expose message/recommendation---- Message from Nino Glow, MD sent at 10/04/2022  4:31 PM EDT -----Hi Cathy Evans,Your LDL (bad cholesterol) levels are significantly improved, but still elevated. Would consider increase the rosuvastatin (Crestor) to 20 mg daily. Think about it and let me know what you decide.Best,Dr. Reece Agar

## 2022-10-06 ENCOUNTER — Telehealth: Admit: 2022-10-06 | Payer: PRIVATE HEALTH INSURANCE | Attending: Rheumatology | Primary: Internal Medicine

## 2022-10-06 NOTE — Telephone Encounter
Call to pt let her know this form has to come from her PCP.    Paula Compton RN

## 2022-10-06 NOTE — Telephone Encounter
Pt called left message on triage line asking that Dr Annye Rusk write a reasonable accomodation letter for her so she can move out of her apt which is 2 floors and into her sons where everything is on one floor and he will rent her a room. She states she needs this for section [employer].      Paula Compton RN

## 2022-10-07 NOTE — Telephone Encounter
Written by Nino Glow, MD on 10/04/2022  4:31 PM EDTSeen by patient Cathy Evans on 10/05/2022  9:01 AM

## 2022-10-21 ENCOUNTER — Inpatient Hospital Stay: Admit: 2022-10-21 | Discharge: 2022-10-21 | Payer: MEDICARE | Primary: Internal Medicine

## 2022-10-21 DIAGNOSIS — N390 Urinary tract infection, site not specified: Secondary | ICD-10-CM

## 2022-10-21 LAB — BASIC METABOLIC PANEL
BKR ANION GAP: 11 (ref 7–17)
BKR BLOOD UREA NITROGEN: 6 mg/dL (ref 6–20)
BKR BUN / CREAT RATIO: 10 x 1000/ÂµL (ref 8.0–23.0)
BKR CALCIUM: 9.8 mg/dL (ref 8.8–10.2)
BKR CHLORIDE: 105 mmol/L (ref 98–107)
BKR CO2: 25 mmol/L (ref 20–30)
BKR CREATININE: 0.6 mg/dL (ref 0.40–1.30)
BKR EGFR, CREATININE (CKD-EPI 2021): >60 mL/min/{1.73_m2} (ref >=60)
BKR GLUCOSE: 90 mg/dL (ref 70–100)
BKR POTASSIUM: 4.2 mmol/L (ref 3.3–5.3)
BKR SODIUM: 141 mmol/L — ABNORMAL HIGH (ref 136–144)

## 2022-10-21 LAB — URINALYSIS WITH CULTURE REFLEX      (BH LMW YH)
BKR BILIRUBIN, UA: NEGATIVE
BKR BLOOD, UA: NEGATIVE
BKR GLUCOSE, UA: NEGATIVE
BKR KETONES, UA: NEGATIVE mL/min/{1.73_m2} (ref >=60–1.00)
BKR LEUKOCYTE ESTERASE, UA: NEGATIVE
BKR NITRITE, UA: NEGATIVE
BKR PH, UA: 6 (ref 5.5–7.5)
BKR PROTEIN, UA: NEGATIVE
BKR SPECIFIC GRAVITY, UA: 1.015 /HPF (ref 1.005–1.030)
BKR UROBILINOGEN, UA (MG/DL): <2 mg/dL (ref <=2.0)

## 2022-10-21 LAB — URINE MICROSCOPIC     (BH GH LMW YH)
BKR RBC/HPF INSTRUMENT: 2 /HPF (ref 0–2)
BKR URINE SQUAMOUS EPITHELIAL CELLS, UA (NUMERIC): 1 /HPF (ref 0–5)
BKR WBC/HPF INSTRUMENT: 2 /HPF (ref 0–5)

## 2022-10-22 LAB — UA REFLEX CULTURE

## 2022-10-25 ENCOUNTER — Emergency Department: Admit: 2022-10-25 | Payer: MEDICARE | Primary: Internal Medicine

## 2022-10-25 ENCOUNTER — Inpatient Hospital Stay
Admit: 2022-10-25 | Discharge: 2022-10-25 | Payer: MEDICARE | Attending: Student in an Organized Health Care Education/Training Program

## 2022-10-25 DIAGNOSIS — R109 Unspecified abdominal pain: Secondary | ICD-10-CM

## 2022-10-25 DIAGNOSIS — R300 Dysuria: Secondary | ICD-10-CM

## 2022-10-25 LAB — CBC WITH AUTO DIFFERENTIAL
BKR WAM ABSOLUTE IMMATURE GRANULOCYTES.: 0.02 x 1000/ÂµL (ref 0.00–0.30)
BKR WAM ABSOLUTE LYMPHOCYTE COUNT.: 2.49 x 1000/ÂµL (ref 0.60–3.70)
BKR WAM ABSOLUTE NRBC (2 DEC): 0 x 1000/ÂµL (ref 0.00–1.00)
BKR WAM ANALYZER ANC: 2.97 x 1000/ÂµL (ref 2.00–7.60)
BKR WAM BASOPHIL ABSOLUTE COUNT.: 0.04 x 1000/ÂµL (ref 0.00–1.00)
BKR WAM BASOPHILS: 0.6 % (ref 0.0–1.4)
BKR WAM EOSINOPHIL ABSOLUTE COUNT.: 0.56 x 1000/ÂµL (ref 0.00–1.00)
BKR WAM EOSINOPHILS: 8.3 % — ABNORMAL HIGH (ref 0.0–5.0)
BKR WAM HEMATOCRIT (2 DEC): 38 % (ref 35.00–45.00)
BKR WAM HEMOGLOBIN: 12.9 g/dL (ref 11.7–15.5)
BKR WAM IMMATURE GRANULOCYTES: 0.3 % (ref 0.0–1.0)
BKR WAM LYMPHOCYTES: 36.8 % (ref 17.0–50.0)
BKR WAM MCH (PG): 28.6 pg (ref 27.0–33.0)
BKR WAM MCHC: 33.9 g/dL (ref 31.0–36.0)
BKR WAM MCV: 84.3 fL (ref 80.0–100.0)
BKR WAM MONOCYTE ABSOLUTE COUNT.: 0.68 x 1000/ÂµL (ref 0.00–1.00)
BKR WAM MONOCYTES: 10.1 % (ref 4.0–12.0)
BKR WAM MPV: 9 fL (ref 8.0–12.0)
BKR WAM NEUTROPHILS: 43.9 % (ref 39.0–72.0)
BKR WAM NUCLEATED RED BLOOD CELLS: 0 % (ref 0.0–1.0)
BKR WAM PLATELETS: 304 x1000/ÂµL (ref 150–420)
BKR WAM RDW-CV: 13.8 % (ref 11.0–15.0)
BKR WAM RED BLOOD CELL COUNT.: 4.51 M/ÂµL (ref 4.00–6.00)
BKR WAM WHITE BLOOD CELL COUNT: 6.8 x1000/ÂµL (ref 4.0–11.0)

## 2022-10-25 LAB — URINALYSIS-MACROSCOPIC W/REFLEX MICROSCOPIC
BKR BILIRUBIN, UA: NEGATIVE
BKR BLOOD, UA: NEGATIVE
BKR GLUCOSE, UA: NEGATIVE
BKR KETONES, UA: NEGATIVE
BKR LEUKOCYTE ESTERASE, UA: NEGATIVE
BKR NITRITE, UA: NEGATIVE
BKR PH, UA: 7.5 (ref 5.5–7.5)
BKR PROTEIN, UA: NEGATIVE
BKR SPECIFIC GRAVITY, UA: 1.013 (ref 1.005–1.030)
BKR UROBILINOGEN, UA (MG/DL): <2 mg/dL (ref <=2.0)

## 2022-10-25 LAB — BASIC METABOLIC PANEL
BKR ANION GAP: 13 (ref 7–17)
BKR BLOOD UREA NITROGEN: 7 mg/dL (ref 6–20)
BKR BUN / CREAT RATIO: 10.9 (ref 8.0–23.0)
BKR CALCIUM: 10 mg/dL (ref 8.8–10.2)
BKR CHLORIDE: 103 mmol/L (ref 98–107)
BKR CO2: 25 mmol/L (ref 20–30)
BKR CREATININE: 0.64 mg/dL (ref 0.40–1.30)
BKR EGFR, CREATININE (CKD-EPI 2021): >60 mL/min/{1.73_m2} (ref >=60–12.0)
BKR GLUCOSE: 98 mg/dL (ref 70–100)
BKR POTASSIUM: 4.2 mmol/L (ref 3.3–5.3)
BKR SODIUM: 141 mmol/L (ref 136–144)

## 2022-10-25 MED ORDER — SODIUM CHLORIDE 0.9 % BOLUS (NEW BAG)
0.9 % | Freq: Once | INTRAVENOUS | Status: DC
Start: 2022-10-25 — End: 2022-10-26

## 2022-10-25 NOTE — ED Notes
4:36 PM Chief Complaint Patient presents with  Flank Pain   R flank pain x  1 week. +dysuria,no frequency,fever.pt currently on antibiotics -cipro for bacterial infection Past Medical History: Diagnosis Date  Chronic kidney disease   PKD, but normal creatinine  Eosinophilic esophagitis 08/26/2020  GERD (gastroesophageal reflux disease)   History of torn meniscus of right knee 02/2022  Hypertension   Mitral valve regurgitation   Palpitations   Polycystic kidney disease   Sickle cell trait (HC Code) (HC CODE) (HC Code)   Natera testing 2021  Vitamin D deficiency  Labs sent from triage. Pt awaiting [place].7:06 PM After two PIV attempts, pt refused anymore tried and stated she does not want an IV now. Provider aware. Darcella Gasman, RN

## 2022-10-26 ENCOUNTER — Ambulatory Visit: Admit: 2022-10-26 | Payer: PRIVATE HEALTH INSURANCE | Attending: Nephrology | Primary: Internal Medicine

## 2022-10-26 ENCOUNTER — Encounter: Admit: 2022-10-26 | Payer: PRIVATE HEALTH INSURANCE | Attending: Nephrology | Primary: Internal Medicine

## 2022-10-26 DIAGNOSIS — D573 Sickle-cell trait: Secondary | ICD-10-CM

## 2022-10-26 DIAGNOSIS — Z881 Allergy status to other antibiotic agents status: Secondary | ICD-10-CM

## 2022-10-26 DIAGNOSIS — R002 Palpitations: Secondary | ICD-10-CM

## 2022-10-26 DIAGNOSIS — N200 Calculus of kidney: Secondary | ICD-10-CM

## 2022-10-26 DIAGNOSIS — I340 Nonrheumatic mitral (valve) insufficiency: Secondary | ICD-10-CM

## 2022-10-26 DIAGNOSIS — Z882 Allergy status to sulfonamides status: Secondary | ICD-10-CM

## 2022-10-26 DIAGNOSIS — Q613 Polycystic kidney, unspecified: Secondary | ICD-10-CM

## 2022-10-26 DIAGNOSIS — K200 Eosinophilic esophagitis: Secondary | ICD-10-CM

## 2022-10-26 DIAGNOSIS — N189 Chronic kidney disease, unspecified: Secondary | ICD-10-CM

## 2022-10-26 DIAGNOSIS — K219 Gastro-esophageal reflux disease without esophagitis: Secondary | ICD-10-CM

## 2022-10-26 DIAGNOSIS — Z87828 Personal history of other (healed) physical injury and trauma: Secondary | ICD-10-CM

## 2022-10-26 DIAGNOSIS — I10 Essential (primary) hypertension: Secondary | ICD-10-CM

## 2022-10-26 DIAGNOSIS — E559 Vitamin D deficiency, unspecified: Secondary | ICD-10-CM

## 2022-10-26 DIAGNOSIS — Z880 Allergy status to penicillin: Secondary | ICD-10-CM

## 2022-10-26 LAB — URINE CULTURE: BKR URINE CULTURE, ROUTINE: NO GROWTH

## 2022-10-26 NOTE — Unmapped
[facility]                               Specialty Programs:	Cardio-Renal 		Kidney Stones			Glomerular Diseases	Onco- Nephrology			Inherited Diseases	Kidney Disease in Pregnancy			Hypertension		Chronic Kidney DiseaseY[facility], [address] [place], [place] [address] [phone] Date: 06/18/24Primary Care Provider: Oneita Jolly Ahmed Previously was followed by Dr Corey Harold DahlPrimary conditions for which we are involved:Polycystic kidney disease Update about patient's health, including new symptoms:The patient is a 57 y.o. female who does not have a family history of PKD.  Her father developed end-stage renal disease and underwent a kidney transplant, but she thinks that may have been due to diabetic kidney disease.  She has a history of both kidney and liver cysts and hypertension, but has never had significant loss of renal function. She previously had anemia, but this was due to iron deficiency, and has resolved completely.  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  We think the PKHD1 is likely causative.Interim HxPatient reports pain over her right flank pain, no diarrhea, ongoing for a week, no urinary symptoms, no vomiting,worst while lying on that side, tender to touch, no constipation Past Medical HistoryMerashe Calabrese has a past medical history of Chronic kidney disease, Eosinophilic esophagitis (08/26/2020), GERD (gastroesophageal reflux disease), History of torn meniscus of right knee (02/2022), Hypertension, Mitral valve regurgitation, Palpitations, Polycystic kidney disease, Sickle cell trait (HC Code) (HC CODE) (HC Code), and Vitamin D deficiency.Medications Current Outpatient Medications Medication Sig Dispense Refill ciprofloxacin HCl (CIPRO) 250 mg tablet Take 1 tablet (250 mg total) by mouth every 12 (twelve) hours for 5 days. 10 tablet 0  cholecalciferol, vitamin D3, 25 mcg (1,000 unit) tablet Take 1 tablet (1,000 Units total) by mouth daily. (Patient not taking: Reported on 06/23/2022) 90 tablet 1  cyclobenzaprine (FLEXERIL) 5 mg tablet Take 1 tablet (5 mg total) by mouth 3 (three) times daily as needed for muscle spasms for up to 10 days. (Patient not taking: Reported on 10/26/2022) 30 tablet 0  fluticasone propionate (FLONASE) 50 mcg/actuation nasal spray Use 1 spray in each nostril daily. (Patient not taking: Reported on 10/05/2022) 16 g 11  meloxicam (MOBIC) 7.5 mg tablet Take 1 tablet (7.5 mg total) by mouth daily. (Patient not taking: Reported on 08/02/2022) 30 tablet 2  metoprolol succinate XL (TOPROL-XL) 25 mg 24 hr tablet Take 1 tablet (25 mg total) by mouth daily. Take with or immediately following a meal. (Patient not taking: Reported on 08/02/2022) 90 tablet 1  nortriptyline (PAMELOR) 10 mg capsule Take 1 capsule (10 mg total) by mouth nightly. (Patient not taking: Reported on 08/02/2022) 30 capsule 1  rosuvastatin (CRESTOR) 10 mg tablet Take 1 tablet (10 mg total) by mouth daily. (Patient not taking: Reported on 05/17/2022) 90 tablet 3  tamsulosin (FLOMAX) 0.4 mg 24 hr capsule Take 1 capsule (0.4 mg total) by mouth daily. (Patient not taking: Reported on 10/26/2022) 14 capsule 0 No current facility-administered medications for this visit. Relevant vitals and labsBP Readings from Last 3 Encounters: 10/25/22 (!) 154/88 10/21/22 (!) 149/91 10/19/22 (!) 154/87 Lab Results Component Value Date  WBC 6.8 10/25/2022  HGB 12.9 10/25/2022  HCT 38.00 10/25/2022  PLT 304 10/25/2022  GLU 98 10/25/2022  BUN 7 10/25/2022  CREATININE 0.64 10/25/2022  CO2 25 10/25/2022  CL 103 10/25/2022  NA 141  10/25/2022  K 4.2 10/25/2022  CALCIUM 10.0 10/25/2022  EGFR >60 08/23/2012 PRCRRAU 0.06 12/11/2018  PHOS 3.5 06/27/2020  PTH 29.4 04/27/2022  HGBA1C 6.1 (H) 06/25/2022  IRON 109 05/01/2019  TIBC 335 05/01/2019  FERRITIN 98 05/01/2019 US RENAL - completed 09/14/19 COMPARISON: 01/11/2019. INDICATION: Left flank pain. FINDINGS: The right kidney measures 11.7 cm in length. The left kidney measures 12.1 cm in length. Multiple bilateral renal cysts are noted. On the right, there is a 2.5 x 2.4 x 2.7 cm simple cyst in the mid-lower pole and a 2.9 x 1.5 x 2.7 cm septated cyst laterally in the lower pole (previously measuring 2.2 x 1.3 x 1.8 cm). On the left, there is a 1.2 x 1.3 x 1.4 cm cyst with possible debris in the mid-upper pole. There is no evidence of nephrolithiasis or hydronephrosis. Mild fullness of the right renal pelvis is noted. The urinary bladder is partially distended. Bilateral ureteral jets are visualized.IMPRESSION: 1. No evidence of nephrolithiasis or hydronephrosis.2. Bilateral renal cysts as above. Follow-up renal ultrasound in one year is recommended. Reported And Signed By: Kathrin Greathouse, MD-------------------------------------------------------------------------------------------------------------------------------------------------------------------------------------------------------------Assessment and Plan:In summary, this is a 57 y.o. female with a history of cystic kidneys and a family history of end-stage renal disease.  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 We think that people with heterozygous PKHD1 mutations may have cysts and we believe that this explains her phenotype.  Overall, this is a very good prognostic finding, which is consistent with her normal renal function in her mid 20s and well-controlled blood pressure with minimal medication. She stopped lisinopril as she Is on metoprololFrom renal standpoint patient is stable.Right sided pain - MSK, advised ibuprofen for 4 days, to be taken with mealsNon obstructive stone in left side, encouraged hydrationFollow up in one yearMaryam Shanette Tamargo MDAssistant Professor of MedicineSection of NephrologyCell # [phone] TELEHEALTH VISIT: This clinician is part of the telehealth program and is conducting this visit in a currently approved location. For this visit the clinician and patient were present via interactive audio & video telecommunications system that permits real-time communications, via the [employer].Patient's use of the telehealth platform followed consent and acknowledges agreement to permit telehealth for this visit. State patient is located in: [place]The clinician is appropriately licensed in the above state to provide care for this visit. Other individuals present during the telehealth encounter and their role/relation: noneBecause this visit was completed over video, a hands-on physical exam was not performed. Patient/parent or guardian understands and knows to call back if condition changes.

## 2022-10-26 NOTE — ED Provider Notes
Chief Complaint Patient presents with  Flank Pain   R flank pain x  1 week. +dysuria,no frequency,fever.pt currently on antibiotics -cipro for bacterial infection HPI/PE:27F PMH significant for PKD, sickle cell trait, undifferentiated connective tissue disease, sarcoidosis, HTN, IDA, MVP, nephrolithiasis presenting with 1 week of R flank pain. She denies trauma or heavy lifting. She states she presented to urgent care 3x; first 2 visits UA negative. On most recent visit, Ucx positive for UCx growing klebsiella, was prescribed 5 day course of cipro. She states the pain has not improved and her PCP sent her in for [place] study. Patient denies dysuria, increased urgency or frequency. She endorses mild nausea, no vomiting, no fever/chills. She states she has had 2 stones in the past that she was able to pass. She states the pain is localized to her R flank and radiates to her groin. She took tylenol earlier today with minimal relief but refusing pain medication on arrival. She denies abnormal vaginal discharge; LMP over 2 years ago as she is s/p hysterectomy. On exam, HDS, afebrile. Abdomen non-distended, soft, non-tender. +R CVA tenderness, no L CVA tenderness. XBM:WUXLKGM is a 27F presenting w/ progressive R flank pain despite course of cipro for UTI, history of kidney stones in the past that she was able to pass. No fevers, chills, +nausea, no vomiting. No dysuria. She is afebrile, HDS on arrival. She is refusing IVF and pain control on arrival. Plan for basic labs, UA/ucx and [place] flank to assess for R sided kidney stone. Ddx: UTI, kidney stone, pyelonephritis Plan: - Labs: bmp, cbc, UA + culture- Diagnostics/Imaging: [place] flank - Medications: refusing IVF and pain medication ED Course:2100: pain present but refusing tylenol. Results reviewed with patient. Pertinent Results: - Labs: No electrolyte abnormalityKidney function normalNo leukocytosisUA negative Ucx pending - Imaging:[place] RENAL STONE (HIGHLY LIKELY OR PRIOR STONE, LOW RADIATION)History/Indication: Abdominal/flank pain, stone suspected Technique: TECHNIQUE AND LIMITATIONS: Axial Reduced Dose [place] scan of the abdomen and pelvis was obtained without the administration of intravenous contrast. The scan was performed using a very low dose technique in order to detect renal tract calculi. The remainder of the exam is extremely limited and may be non-diagnostic for other pathology.Comparison: [place] ABDOMEN PELVIS W IV CONTRAST 2023-06-23FINDINGS:  KIDNEYS & URETERS: Indeterminate focus of hyperdensity in the area of the distal left ureter measuring up to 3 mm without associated upstream hydroureter (series 3 image 539). No renal or right ureteral calculi. No right hydronephrosis. Evaluation of previously noted renal cysts and indeterminate mass lesions is significantly limited in setting of noncontrast exam, and is better appreciated on prior MRI.URINARY BLADDER: Under distended, which limits evaluation; no calculi are visualized within the urinary bladder.LUNG BASES: Unremarkable. LIVER: Unremarkable.GALLBLADDER: Unremarkable.SPLEEN: Unremarkable.PANCREAS: Unremarkable.ADRENALS: Unremarkable.BOWEL: No bowel obstruction. Scattered colonic diverticulosis without acute diverticulitis.PERITONEUM: Unremarkable.VESSELS: Limited noncontrast evaluation of the vessels is unremarkable.PELVIS: Unremarkable.BONES & SOFT TISSUE: No acute osseous injury. Grade 1 anterolisthesis of L4 on L5, unchanged from prior.IMPRESSION: Indeterminate 3 mm hyperdensity within the area of the distal left ureter, which may represent ureteral calculus, without obstructing left hydroureteronephrosis.Please note that previously noted renal cysts and indeterminate renal mass lesions are not well appreciated on this noncontrast [place] examination.[place] Rad[facility] Notify System Classification: Routine.Report initiated by:  Pablo , MDReported and signed by: Stanton Kidney, MD [facility] ER workup significant for: L nephrolithiasis w/o obstruction or hydronephosis. Discussed findings with patient. Pain still present on right side but manageable and refusing pain medication or IVF in house. Given small 3mm  hyperdensity L ureter w/o obstruction or hydronephrosis, plan to discharge home with OTC pain management. Instructed patient to follow up with PCP this week and complete course of antibiotics prescribed by PCP for UTI Disposition: Discharge home Prescriptions given: OTC tylenol and ibuprofen Out-patient F/U: PCP 1-2 days Strict return precautions discussedDiscussed with Dr. Jena Gauss, PA-CEmergency Medicine Available on [facility] An acute or life threatening problem was considered during this evaluation  A decision regarding hospitalization was made during this visit  Patient does not require admission or further ED Observation at this time    Physical ExamED Triage Vitals [10/25/22 1303]BP: (!) 168/100Pulse: 84Pulse from  O2 sat: n/aResp: 18Temp: 97.8 ?F (36.6 ?C)Temp src: n/aSpO2: 99 % BP (!) 157/94  - Pulse 75  - Temp 97.8 ?F (36.6 ?C)  - Resp 18  - LMP 08/30/2017 (Exact Date) Comment: hyst 08/30/2017 - SpO2 99% Physical ExamVitals reviewed. Constitutional:     General: She is not in acute distress.   Appearance: Normal appearance. She is not ill-appearing, toxic-appearing or diaphoretic. HENT:    Head: Normocephalic.    Mouth/Throat:    Mouth: Mucous membranes are moist.    Pharynx: Oropharynx is clear. Eyes:    Pupils: Pupils are equal, round, and reactive to light. Cardiovascular:    Rate and Rhythm: Normal rate and regular rhythm. Pulmonary:    Effort: Pulmonary effort is normal.    Breath sounds: Normal breath sounds. Abdominal:    General: Abdomen is flat. Bowel sounds are normal. There is no distension.    Palpations: Abdomen is soft.    Tenderness: There is no abdominal tenderness. There is right CVA tenderness. There is no left CVA tenderness or guarding. Musculoskeletal:       General: Normal range of motion. Skin:   General: Skin is warm and dry. Neurological:    General: No focal deficit present.    Mental Status: She is alert and oriented to person, place, and time. Mental status is at baseline. Psychiatric:       Mood and Affect: Mood normal.       Behavior: Behavior normal.       Thought Content: Thought content normal.       Judgment: Judgment normal.  ProceduresAttestation/Critical CarePatient Reevaluation: ED Attestation: PA/APRNFace to face evaluation was performed by me in collaboration with the Advanced Practice Provider to assess for significant health threats. I provided a substantive portion of the care of this patient.  I personally performed medically appropriate history and physical exam and the MDM: On my exam:Patient with concerns for recurrent nephrolithiasis, patient with recent urinary tract infection being treated with Cipro per culture data.Patient with right flank pain, concern for infected stone versus pyelonephritis, less likely given lack of fever, lack of leukocytosis.  Patient with nonobstructing stone to left side, plan to continue urinary tract infection treatment.  Given that stones or nonobstructive, do not see reason for tamsulosin or similar.  Patient has been deferring pain medication while in the emergency department.Patient seen outside normal primary care doctor hoursPt's condition can be appropriately outpatient. Anticipatory guidance given, return precautions discussed.  Emphasized the importance of close outpatient follow up.Tamala Fothergill MoylanClinical Impressions as of 10/25/22 2121 Nephrolithiasis Flank pain ED DispositionDischarge Sheilah Mins, PA06/17/24 2114 Amyre Segundo, Blythe Stanford, MD06/17/24 2121

## 2022-10-26 NOTE — ED Notes
8:12 PM Assumed care after report while patient was in imaging. Patient is now back room imaging. Updated patient, awaiting results. Patient denies needs.

## 2022-10-26 NOTE — Unmapped
Take ibuprofen 400 mg with meals twice daily for 4 daysContinue your antibioticsWork on hydration, close to a gallon of water daily

## 2022-10-26 NOTE — Discharge Instructions
You were evaluated for right sided flank pain today. Your labs are unremarkable, repeat urinalysis negative. Your [place] flank showed no stone on the right side, left side with small 3mm hyperdensity in the distal ureter without obstruction or back  up of urine. Please continue the antibiotic prescribed by your primary care doctor and make follow up appointment this week. Please alternate tylenol (650mg ) and motrin (400-600mg ) every 4-6 hours, please take motrin with food. Please return to the ER for new or worsening symptoms

## 2022-11-25 ENCOUNTER — Telehealth: Admit: 2022-11-25 | Payer: PRIVATE HEALTH INSURANCE | Attending: Cardiovascular Disease | Primary: Internal Medicine

## 2022-11-25 NOTE — Telephone Encounter
Faxed Dr. Shelva Majestic note for the functional capacity evaluation process with receipt confirmation.

## 2022-11-25 NOTE — Telephone Encounter
Pt scheduled to have Functional test tomorrow 7/19 at 9am and office needs clearance prior to this. They will be re faxing clearance form.

## 2023-02-04 ENCOUNTER — Inpatient Hospital Stay: Admit: 2023-02-04 | Discharge: 2023-02-04 | Payer: PRIVATE HEALTH INSURANCE | Primary: Internal Medicine

## 2023-02-04 DIAGNOSIS — D869 Sarcoidosis, unspecified: Secondary | ICD-10-CM

## 2023-02-08 ENCOUNTER — Inpatient Hospital Stay: Admit: 2023-02-08 | Discharge: 2023-02-08 | Payer: PRIVATE HEALTH INSURANCE | Primary: Internal Medicine

## 2023-02-08 DIAGNOSIS — R0602 Shortness of breath: Secondary | ICD-10-CM

## 2023-02-08 DIAGNOSIS — D869 Sarcoidosis, unspecified: Secondary | ICD-10-CM

## 2023-02-08 LAB — CBC WITH AUTO DIFFERENTIAL
BKR WAM ABSOLUTE IMMATURE GRANULOCYTES.: 0.01 x 1000/ÂµL (ref 0.00–0.30)
BKR WAM ABSOLUTE LYMPHOCYTE COUNT.: 1.63 x 1000/ÂµL (ref 0.60–3.70)
BKR WAM ABSOLUTE NRBC (2 DEC): 0 x 1000/ÂµL (ref 0.00–1.00)
BKR WAM ANC (ABSOLUTE NEUTROPHIL COUNT): 2.43 x 1000/ÂµL (ref 2.00–7.60)
BKR WAM BASOPHIL ABSOLUTE COUNT.: 0.05 x 1000/ÂµL (ref 0.00–1.00)
BKR WAM BASOPHILS: 1 % (ref 0.0–1.4)
BKR WAM EOSINOPHIL ABSOLUTE COUNT.: 0.47 x 1000/ÂµL (ref 0.00–1.00)
BKR WAM EOSINOPHILS: 9.3 % — ABNORMAL HIGH (ref 0.0–5.0)
BKR WAM HEMATOCRIT (2 DEC): 39 % (ref 35.00–45.00)
BKR WAM HEMOGLOBIN: 12.5 g/dL (ref 11.7–15.5)
BKR WAM IMMATURE GRANULOCYTES: 0.2 % (ref 0.0–1.0)
BKR WAM LYMPHOCYTES: 32.3 % (ref 17.0–50.0)
BKR WAM MCH (PG): 27.8 pg (ref 27.0–33.0)
BKR WAM MCHC: 32.1 g/dL (ref 31.0–36.0)
BKR WAM MCV: 86.9 fL (ref 80.0–100.0)
BKR WAM MONOCYTE ABSOLUTE COUNT.: 0.46 x 1000/ÂµL (ref 0.00–1.00)
BKR WAM MONOCYTES: 9.1 % (ref 4.0–12.0)
BKR WAM MPV: 9.5 fL (ref 8.0–12.0)
BKR WAM NEUTROPHILS: 48.1 % (ref 39.0–72.0)
BKR WAM NUCLEATED RED BLOOD CELLS: 0 % (ref 0.0–1.0)
BKR WAM PLATELETS: 293 x1000/ÂµL (ref 150–420)
BKR WAM RDW-CV: 14.1 % (ref 11.0–15.0)
BKR WAM RED BLOOD CELL COUNT.: 4.49 M/ÂµL (ref 4.00–6.00)
BKR WAM WHITE BLOOD CELL COUNT: 5.1 x1000/ÂµL (ref 4.0–11.0)

## 2023-02-08 LAB — COMPREHENSIVE METABOLIC PANEL
BKR A/G RATIO: 1.2 (ref 1.0–2.2)
BKR ALANINE AMINOTRANSFERASE (ALT): 23 U/L (ref 10–35)
BKR ALBUMIN: 4.2 g/dL (ref 3.6–5.1)
BKR ALKALINE PHOSPHATASE: 121 U/L (ref 9–122)
BKR ANION GAP: 12 (ref 7–17)
BKR ASPARTATE AMINOTRANSFERASE (AST): 36 U/L — ABNORMAL HIGH (ref 10–35)
BKR AST/ALT RATIO: 1.6
BKR BILIRUBIN TOTAL: 0.4 mg/dL (ref <=1.2)
BKR BLOOD UREA NITROGEN: 8 mg/dL (ref 6–20)
BKR BUN / CREAT RATIO: 12.5 (ref 8.0–23.0)
BKR CALCIUM: 9.9 mg/dL (ref 8.8–10.2)
BKR CHLORIDE: 103 mmol/L (ref 98–107)
BKR CO2: 27 mmol/L (ref 20–30)
BKR CREATININE: 0.64 mg/dL (ref 0.40–1.30)
BKR EGFR, CREATININE (CKD-EPI 2021): >60 mL/min/{1.73_m2} (ref >=60)
BKR GLOBULIN: 3.6 g/dL (ref 2.0–3.9)
BKR GLUCOSE: 90 mg/dL (ref 70–100)
BKR POTASSIUM: 4.3 mmol/L (ref 3.3–5.3)
BKR PROTEIN TOTAL: 7.8 g/dL (ref 5.9–8.3)
BKR SODIUM: 142 mmol/L (ref 136–144)

## 2023-02-08 LAB — TSH W/REFLEX TO FT4     (BH GH LMW Q YH): BKR THYROID STIMULATING HORMONE: 3.42 u[IU]/mL

## 2023-02-08 LAB — NT-PROBNPE: BKR B-TYPE NATRIURETIC PEPTIDE, PRO (PROBNP): <36 pg/mL (ref <125.0)

## 2023-02-08 NOTE — Other
 Result was reviewed for cell blood count. It is normal range. Patient has still labs specifically BNP pending. We will follow up with the patients when all result come back.

## 2023-02-08 NOTE — Other
 Spirometry result was reviewed. Patient has had interstitial lung disease and history of sarcoidosis. We have already discussed the result of spirometry with the patient. Patient was seen in person today by Dr. Patricia Nettle. Patient has more work ups pending. I will follow up with the patient when all results are available.

## 2023-02-09 NOTE — Other
 Labs results were reviewed. All are normal. Dr. Patricia Nettle sent a message to the patient via MyChart. I called the patient and spoke to her. Apparently, her pulmonologist, Dr. Ginnie Smart replied to Dr. Patricia Nettle and did not think that steroid is indicated. However, I would like them to see the patient in person. Ms. Clouatre is going to schedule an in person appointment with pulmonology clinic. She has TTE scheduled for 11.14.2024. We will follow up the result of the echo and pulmonology follow up.

## 2023-03-23 ENCOUNTER — Ambulatory Visit: Admit: 2023-03-23 | Payer: PRIVATE HEALTH INSURANCE | Attending: Cardiovascular Disease | Primary: Internal Medicine

## 2023-03-24 ENCOUNTER — Inpatient Hospital Stay: Admit: 2023-03-24 | Discharge: 2023-03-24 | Payer: PRIVATE HEALTH INSURANCE | Primary: Internal Medicine

## 2023-03-24 DIAGNOSIS — I341 Nonrheumatic mitral (valve) prolapse: Secondary | ICD-10-CM

## 2023-03-24 DIAGNOSIS — R002 Palpitations: Secondary | ICD-10-CM

## 2023-03-25 NOTE — Other
 Hi Ms. Chalina, [place] echocardiogram looks good. No changes.talk to you soon!Dr. Reece Agar

## 2023-03-29 ENCOUNTER — Inpatient Hospital Stay: Admit: 2023-03-29 | Discharge: 2023-03-29 | Payer: PRIVATE HEALTH INSURANCE | Primary: Internal Medicine

## 2023-03-29 DIAGNOSIS — K7689 Other specified diseases of liver: Secondary | ICD-10-CM

## 2023-03-30 ENCOUNTER — Ambulatory Visit: Admit: 2023-03-30 | Payer: PRIVATE HEALTH INSURANCE | Attending: Cardiovascular Disease | Primary: Internal Medicine

## 2023-03-31 ENCOUNTER — Encounter: Admit: 2023-03-31 | Payer: PRIVATE HEALTH INSURANCE | Attending: Rheumatology | Primary: Internal Medicine

## 2023-03-31 DIAGNOSIS — D869 Sarcoidosis, unspecified: Secondary | ICD-10-CM

## 2023-03-31 NOTE — Patient Instructions
 Labs before our next visitFollowup with lung doctor

## 2023-03-31 NOTE — Progress Notes
 VIDEO TELEHEALTH VISIT: This clinician is part of the telehealth program and is conducting this visit in a currently approved location. For this visit the clinician and patient were present via interactive audio & video telecommunications system that permits real-time communications, via the [employer].Patient's use of the telehealth platform followed consent and acknowledges agreement to permit telehealth for this visit. State patient is located in: [place]The clinician is appropriately licensed in the above state to provide care for this visit. Other individuals present during the telehealth encounter and their role/relation: noneBecause this visit was completed over video, a hands-on physical exam was not performed. Patient/parent or guardian understands and knows to call back if condition changes.Cathy RheumatologyDiagnosis: Undifferentiated Connective Tissue Disease Serologies: +ANA 1:2560 nuclear dots; negative Sm, RNP, SSA, SSB, dsDNALA not detected, B2GP, ACL Ab negativeCCP negative, RF negativeUrine Prot/Cr ratio 0.06C3 140, C4 : alopecia, arthralgias, dry eyes, dry mouthPast medications: hydroxychloroquine 200 mg BIDCurrent therapy: noneHPI from 08/2018:Cathy Evans presents for initial visit, never saw a rheumatologist in the past.Cathy Evans is a 57 y.o. female with PMHx notable for HTN, polycystic kidney disease, who presents for initial visit. She was referred by her gastroenterologist Dr. Vesta Mixer for concern of positive ANA, found as part of evaluation for dysphagia. She has underwent EGD and colonoscopy fall 2019 but notes symptoms persist despite initiation of PPI. She reports fatigue, hair thinning, episode of oral ulcers, along with arthralgia and myalgia, pregnancy loss at 3 months and possible issue with her platelets in the past. 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 Pt encouraged to use MyChart to let us know if she has any questions or concerns.Interval Change:--Seen 12/2018, we discussed how her presentation may be suggestive of an evolving UCTD for which we discussed starting HCQ (AEs discussed) to which she agreed. She will start HCQ 200 mg daily and schedule Ophthalmology visit with [facility]. She will return for followup in 3 months or earlier PRN.--Seen 05/22/2019, on HCQ 200 mg daily that was started after her last visit, noting overall improvement, particularly in her MSK symptoms. She still endorses sicca, hair thinning, and fatigue; she describes some mild hand symptoms that are suggestive of inflammatory arthritis. We discussed continuing to monitor on HCQ 200 mg daily--she is up to date on Ophthalmology evaluation and has VF testing next week. We will see her for followup in 3-4 months.  --Seen 08/28/2019, on HCQ 200 mg daily, noting some MSK symptoms that are seem more mechanical in etiology. She also continues to have hair thinning, dry mouth, and some dysphagia. Her last GI visit was in early 2020 after endoscopies in 2019; I encouraged her to followup with GI prior to considering barium swallow study. At this time, we discussed scheduling an in-person visit and will plan to repeat labs before her followup in 3-4 months. 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 --Seen 12/03/2021, for her first in-person visit with me. She was started on HCQ but returns today after she self-DCd one year prior. Recently, clinical course c/b parotitis and thoracic adenopathy, now undergoing [place] abdomen/pelvis and evaluation by Oncology. Mammogram and ultrasound notable for BI-RAD2. She also describes fatigue, sicca, dysphagia, cramping abdominal pain. At this time, we discussed awaiting possible biopsy for further elucidation of underlying process--we will touch base after workup for thoracic adenopathy. In the interim, we will repeat serologies. We  will hold on resuming HCQ for now. --Seen 02/11/2022, with palpitations and going to see Cardiology next month--also recommended to schedule PET myocardial for sarcoid involvement. She continues to have some SOB and noted to have restriction on PFTs concerning for neuromuscular involvement so myositis panel was ordered. At this time, we discussed how it is unclear if her palpitations are related to sarcoid and we will await additional workup. We will also check muscle enzymes along with monitoring labs. In the interim, she was encouraged to restart HCQ, which she is not taking. --Seen 05/17/2022, noting ongoing palpitations s/p Cardiology evaluation, noted to unlikely to have cardiac sarcoid given no evidence of conduction disease, arrhythmia, or syncope/heart failure symptoms. She last saw Pulmonary 04/2022, recommended for PFTs in one year, with no indication for treatment. She is having fatigue. She notes MSK symptoms in her left mid-back that is suggestive of mechanical etiologies, other joints are overall stable. She is also using topical treatment for rash.  At this time, she was encouraged to schedule followup with Dermatology to evaluate for active skin in the setting of sarcoid, as there is no indication for immunosuppression in the setting of pulmonary and cardiac systems. Her MSK symptoms seem more from mechanical etiologies (normal CK, aldolase, ESR 21, CRP <10) for which we will start trial of diclofenac topical. We will continue to monitor clinically--Seen 09/16/2022, noting SOB upon exertion, s/p repeat [place] chest 08/2022 that showed hilar adenopathy but no change in lung findings, Pulmonary team recommended treatment for post nasal drip, no systemic treatment for sarcoid. She continues to have low back pain despite Tylenol PRN, cyclobenzaprine PRN. She had worsened symptoms with PT and now referred to aquatherapy. Other joints okay. At this time, we discussed how her low back symptoms are most likely mechanical but will proceed with MRI lumbar spine to exclude sarcoid involvement--we will also repeat labs this summer. Otherwise, no indication for immunosuppression for her pulmonary and cardiac systems. Today:She requested in-person visit to be switched to telehealth today ~1 hour prior to appointment.Her MRI lumbar spine showed mild interval progression. She continues to have chest tightness, in the setting of PFTs showing normal DLCO, no CAD on [place]. She saw PCP Dr. Julian Reil 10/1 for similar symptoms. She notes skin feels tight around her chest. She is doing stretches, utilizing heating pad. She feels pressure in her muscle. She has not tired diclofenac. She is still having back pain, was referred to Spine Specialist. She has some rashes in the folds of her skin in her abdomen. She does not have neck swelling, no fevers.Review of Systems (negative except as noted; positive findings bold)Constitutional: weight changes, fatigue, malaise, fever, chills, sweatsSkin: rashes, photosensitivity, hives, easy bruisability, alopecia, scalp tenderness, skin nodules or psoriasis Eyes:  pain, redness, itching, visual blurring, dryness, foreign body sensationENT: tinnitus, hearing loss, sinus congestion, loss of smell, dry nose, bloody nose, jaw claudication, oral ulcers, loss of taste, dry mouth, hoarsenessCardiac: chest pain, palpitations, irregular heartbeat, exertional dyspneaVascular: Raynaud?s, chilblains, frostbite, venous stasis, thrombosisPulmonary: shortness of breath, cough, wheeze, hemoptysis, chest wall pain, pleuritic chest pain, current tobacco use, GI: difficulty swallowing, nausea, vomiting, GERD, ulcers, constipation, diarrhea, change in bowel habits, abdominal pain, liver diseaseGU: genital ulcers, dysuria, blood in urine, nocturia, infections, kidney stonesMusculoskeletal: morning stiffness, neck pain, back pain, joint pain, joint swelling, muscle aching or tenderness, muscle weakness, chest wall tightnessNeurological: numbness, tingling, headaches, fainting, dizziness, imbalance, memory loss, seizure, strokeHeme/Lymph: swollen or tender glands, anemiaPsychiatric:  anxiety, irritability, depression, sleep disturbancePast Medical History:Past  Medical History: Diagnosis Date  Chronic kidney disease   PKD, but normal creatinine  Eosinophilic esophagitis 08/26/2020  GERD (gastroesophageal reflux disease)   History of torn meniscus of right knee 02/2022  Hypertension   Mitral valve regurgitation   Palpitations   Polycystic kidney disease   Sickle cell trait (HC Code) (HC CODE) (HC Code)   [place] testing 2021  Vitamin D deficiency  Past Surgical History: Procedure Laterality Date  APPENDECTOMY  1984  COLONOSCOPY    DILATION AND CURETTAGE OF UTERUS    HYSTERECTOMY  08/30/2017  LAPAROSCOPY    LEEP    TONSILLECTOMY    TUBAL LIGATION    UPPER GASTROINTESTINAL ENDOSCOPY   Allergy:Allergies Allergen Reactions  Penicillins Hives   Hives Has patient had a PCN reaction causing immediate rash, facial/tongue/throat swelling, SOB or lightheadedness with hypotension: YESHas patient had a PCN reaction causing severe rash involving mucus membranes or skin necrosis: NOHas patient had a PCN reaction that required hospitalization NOHas patient had a PCN reaction occurring within the last 10 years: NOIf all of the above answers are NO, then may proceed with Cephalosporin use.  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 No current facility-administered medications for this visit. Lab and Imaging ResultsLab Results Component Value Date  WBC 5.1 02/08/2023  HGB 12.5 02/08/2023  HCT 39.00 02/08/2023  MCV 86.9 02/08/2023  PLT 293 02/08/2023 Lab Results Component Value Date  CREATININE 0.64 02/08/2023  BUN 8 02/08/2023  NA 142 02/08/2023  K 4.3 02/08/2023  CL 103 02/08/2023  CO2 27 02/08/2023 Lab Results Component Value Date  ALT 23 02/08/2023  AST 36 (H) 02/08/2023  ALKPHOS 121 02/08/2023  BILITOT 0.4 02/08/2023  12/2019ANA	1:1280 nuclear, multiple nuclear dotsdsDNA	Negative Scl-70 	Negative SSA	NegativeSSB	NegativeJo-1 	NegativeCCP 	<16RF IgM 	10ANCA	NegativeSm Ab	NegativeMito Ab	Negative C3	161C4	4212/19/2019 [place] abdomen pelvisThe liver, gallbladder, spleen, pancreas, and adrenal glands are unremarkable, except for tiny hepatic cysts. There are multiple bilateral renal cysts and hypodensities too small to characterize, including a 2.0 cm lesion in the right kidney that likely represents a cyst with new hemorrhagic/proteinaceous debris (image 35, series 2). There is no bowel obstruction, ascites, or lymphadenopathy. No aggressive osseous lesions are seen.IMPRESSION: No evidence of metastatic disease.08/30/2022 [place] chestSlight increase in the size of mediastinal adenopathy compared to study from June 2023. No change in lung findings including a few perilymphatic nodules. No significant bronchiectasis or architectural distortion in the lungs. SummaryMerashe Byrdsong is a 57 y.o. 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 She returns for followup, noting ongoing back pain for which she will see Spine Surgery. She also has continued chest tightness despite PFTs showing normal DLCO and [place] imaging that did not show CAC. She has recently underwent MRI abdomen without contrast that demonstrated hepatic lesions most c/w benign cyst versus hamartoma, mild hepatic steatosis, no suspicious masses or adenopathy. At this time, we discussed how she fortunately has not needed immunosuppression for sarcoid as her back symptoms are in the setting of DDD. She was encouraged to schedule followup with Pulmonary. We will repeat monitoring labs before  her next visit.  RecommendationsUCTDSarcoid lymphadenopathyRestriction on PFTsNo parenchymal involvementNo cardiac involvementLabs to monitor disease activity and medications: Orders Placed This Encounter Procedures  Sedimentation rate (ESR) ([facility])  C-reactive protein (CRP) ([facility])  CBC and differential ([facility])  Comprehensive metabolic panel ([facility]) Monitor off DMARDMSKLow back painSuspect costochondritisHome stretchesDiclofenac 1% topical PRNPatient education/counseling RE: UCTD, sarcoidFollow up: The patient knows to call with any concerns before the next appointment. Next visit: 6 monthsOn the day of this patient's encounter, a total of 32 minutes was personally spent by me.  This does not include any resident/fellow teaching time, or any time spent performing a procedural service.Hessie Diener, MDYale Evans Laqueshia Cihlar.[email or web address]/[email or web address]/2024

## 2023-04-10 ENCOUNTER — Inpatient Hospital Stay: Admit: 2023-04-10 | Discharge: 2023-04-10 | Payer: PRIVATE HEALTH INSURANCE | Primary: Internal Medicine

## 2023-04-11 ENCOUNTER — Encounter: Admit: 2023-04-11 | Payer: PRIVATE HEALTH INSURANCE | Primary: Internal Medicine

## 2023-04-11 ENCOUNTER — Ambulatory Visit: Admit: 2023-04-11 | Payer: PRIVATE HEALTH INSURANCE | Attending: Cardiovascular Disease | Primary: Internal Medicine

## 2023-04-11 ENCOUNTER — Encounter
Admit: 2023-04-11 | Payer: PRIVATE HEALTH INSURANCE | Attending: Student in an Organized Health Care Education/Training Program | Primary: Internal Medicine

## 2023-04-11 ENCOUNTER — Encounter: Admit: 2023-04-11 | Payer: PRIVATE HEALTH INSURANCE | Attending: Cardiovascular Disease | Primary: Internal Medicine

## 2023-04-11 VITALS — Ht 65.0 in | Wt 185.0 lb

## 2023-04-11 DIAGNOSIS — Q613 Polycystic kidney, unspecified: Secondary | ICD-10-CM

## 2023-04-11 DIAGNOSIS — D573 Sickle-cell trait: Secondary | ICD-10-CM

## 2023-04-11 DIAGNOSIS — E559 Vitamin D deficiency, unspecified: Secondary | ICD-10-CM

## 2023-04-11 DIAGNOSIS — I340 Nonrheumatic mitral (valve) insufficiency: Secondary | ICD-10-CM

## 2023-04-11 DIAGNOSIS — I10 Essential (primary) hypertension: Secondary | ICD-10-CM

## 2023-04-11 DIAGNOSIS — K200 Eosinophilic esophagitis: Secondary | ICD-10-CM

## 2023-04-11 DIAGNOSIS — R002 Palpitations: Secondary | ICD-10-CM

## 2023-04-11 DIAGNOSIS — Z87828 Personal history of other (healed) physical injury and trauma: Secondary | ICD-10-CM

## 2023-04-11 DIAGNOSIS — N189 Chronic kidney disease, unspecified: Secondary | ICD-10-CM

## 2023-04-11 DIAGNOSIS — R0609 Other forms of dyspnea: Secondary | ICD-10-CM

## 2023-04-11 DIAGNOSIS — K219 Gastro-esophageal reflux disease without esophagitis: Secondary | ICD-10-CM

## 2023-04-11 MED ORDER — ROSUVASTATIN 10 MG TABLET
10 | ORAL_TABLET | Freq: Every evening | ORAL | 4 refills | Status: AC
Start: 2023-04-11 — End: ?

## 2023-04-11 NOTE — Progress Notes
 [employer] of MedicineSection of Cardiovascular MedicineYale Cardiomyopathy ProgramReferring Provider:Referring MDSubjective: Chief Complaint:   Cathy Evans comes in today for follow-up on palpitations, mitral valve prolapse, sarcoidosis screening and dyspnea with exertion.  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  She follows with Dr. Annye Rusk from Rheumatology.Please refer to my initial note from 03/03/2022. At that time, patient reported daytime fatigue and SOB.  She had an echocardiogram in March of 2022 showing normal biventricular size and function with LVEF of 69%, elevated right atrial pressure, moderate mitral regurgitation with posteriorly directed jet, and no significant pericardial effusion.  She had an event monitor in March of 2022, showing a short atrial run, PACs, and PVCs, less than 1%.  Eventually, she was transitioned to metoprolol succinate but felt it had not helped her.  She feels that her sensation of palpitations has changed since last year.  She feels that whenever she experiences nausea and belching, she has sensation of her heart fluttering.  Again, she remains without syncope or near-syncope.  She experiences musculoskeletal chest pain when she lifts things, but no exertional chest pain, pressure, or other anginal equivalents. 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 Degree of MR was slightly better than previous year. Holter monitor showed <1% burden of PVCs/SVE, with some SVT. NT-proBNP was negative. LDL, LP (a) and APoB were elevated. She was started on Crestor 10 mg daily.She was seen on video visit in June 23, 2022.  Felt like palpitations were related to eating foods.  She has stopped her metoprolol and rosuvastatin.Echocardiogram in March 24, 2023 showed normal biventricular size and function with mild mitral and tricuspid regurgitation, unchanged from prior.  In the past, and an FDG cardiac PET was discussed to assess for inflammation but patient declined.She presents today April 11, 2023 for video follow-up.  Continues to experience palpitations mostly with certain foods.  She did not feel that beta blockers were helpful.  She has had significant weight gain and was trying to increase her physical activity and change her diet to improve her cholesterol.  She remains off rosuvastatin.  Continues to experience dyspnea with exertion.  [place] scan from August 30, 2022 to not show coronary calcifications.Medical History: Reviewed and updated is the past medical history, past surgical history, and past social history.  They are as follows: PMH PSH  She    has a past medical history of Chronic kidney disease, Eosinophilic esophagitis (08/26/2020), GERD (gastroesophageal reflux disease), History of torn meniscus of right knee (02/2022), Hypertension, Mitral valve regurgitation, Palpitations, Polycystic kidney disease, Sickle cell trait (HC Code) (HC CODE) (HC Code), and Vitamin D deficiency. She  has a past surgical history that includes Tubal ligation; LEEP; Dilation and curettage of uterus; laparoscopy; Tonsillectomy; Hysterectomy (08/30/2017); Appendectomy (1984); Colonoscopy; and Upper gastrointestinal endoscopy. Social History Family History She  reports that she has never smoked. She has been exposed to tobacco smoke. She has never used smokeless tobacco. She reports that she does not currently use alcohol. She reports that she does not use drugs. Family History Problem Relation Age of Onset  High cholesterol Mother   Hypertension Mother  Stroke Mother   Diabetes Father   Kidney disease Father   Hyperthyroidism Sister   Diabetes Brother   Hypertension Brother   Diabetes Brother   Breast cancer Maternal Grandmother   Asthma Son   Ovarian cancer Maternal Aunt   Heart attack Paternal Uncle   Medications Reviewed and updated is the medication list:No outpatient medications have been marked as taking for the 04/11/23 encounter (Telemedicine) with Elyn Aquas, Webb Laws, MD.  Allergies She is allergic to penicillins, sulfa (sulfonamide antibiotics), sulfamethoxazole-trimethoprim, sulfasalazine, and moxifloxacin. Review of Systems: ROSThe remainder of the 12 point review of systems was reviewed with the patient and is negative.Objective: Vital Signs:Ht 5' 5 (1.651 m)  - Wt 83.9 kg Comment: per pt - LMP 08/30/2017 (Exact Date) Comment: hyst 08/30/2017 - BMI 30.79 kg/m?  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  She follows with Dr. Annye Rusk from Rheumatology.Ms. Mazeika continues to experience chronic symptoms:- palpitations, these are not new.  We know from previous Holter monitors that she has both PACs, PVCs, SVT.  These are not bothersome to her. I have explained to her that we could try higher dose of metoprolol or change to a calcium channel blocker.  She will think about it at this time.- ongoing shortness of breath now with exertion, I recommend proceeding with coronary CTA to assess for obstructive CAD as symptoms may be anginal equivalents.  It is possible that it is also secondary to deconditioning giving decreased activity due to musculoskeletal issues.  She is in agreement with this plan.- hyperlipidemia, most recent LDL was 126 mg/dL in May.  She agrees to be restarted on cholesterol-lowering therapy.  I have reordered Crestor 10 mg daily with plan to repeat lipid panel in 3 months and adjust as needed.- in the past, she has declined other testing to assess for cardiac involvement of sarcoidosis, however she has remained without significant ventricular arrhythmias, evidence of conduction disease on EKG, normal LV function.  NT proBNP has also been consistently normal.I will contact her with results of testing and plan going forward.  Next visit will be in 1 year but this needs to be in-person, sooner if changes or new symptoms.I encouraged patient to call me with questions or concerns.41 minutes of my time was spent during this evaluation.  This time comprised review of medical records, personal interpretation of ECG and/or diagnostic studies and lab tests,meeting with the patient face to face for a lengthy interview, detailed physical exam, documentation of this visit, written instructions, coordination of care, and discussion with other clinicians as needed. Lucillie Garfinkel, MD MHSAssistant Professor of Medicine (Cardiology)[place] Cardiomyopathy ProgramYale [facility] of MedicineSection of Cardiovascular MedicineNew [place] Office: [phone] Office: [phone] Lebron Conners Kattan12/06/2022 12:13 PM VIDEO TELEHEALTH VISIT: This clinician is part of the telehealth program and is conducting this visit in a currently approved location. For this visit the clinician and patient were present via interactive audio & video telecommunications system that permits real-time communications, via the [employer].Patient's use of the telehealth platform followed consent and acknowledges agreement to permit telehealth for this  visit. State patient is located in: [place] clinician is appropriately licensed in the above state to provide care for this visit. Other individuals present during the telehealth encounter and their role/relation: noneBecause this visit was completed over video, a hands-on physical exam was not performed. Patient/parent or guardian understands and knows to call back if condition changes.

## 2023-04-12 DIAGNOSIS — E785 Hyperlipidemia, unspecified: Secondary | ICD-10-CM

## 2023-04-18 ENCOUNTER — Encounter: Admit: 2023-04-18 | Payer: PRIVATE HEALTH INSURANCE | Primary: Internal Medicine

## 2023-04-21 ENCOUNTER — Inpatient Hospital Stay: Admit: 2023-04-21 | Payer: PRIVATE HEALTH INSURANCE | Primary: Internal Medicine

## 2023-05-02 ENCOUNTER — Telehealth
Admit: 2023-05-02 | Payer: PRIVATE HEALTH INSURANCE | Attending: Student in an Organized Health Care Education/Training Program | Primary: Internal Medicine

## 2023-05-02 ENCOUNTER — Encounter: Admit: 2023-05-02 | Payer: PRIVATE HEALTH INSURANCE | Attending: Rheumatology | Primary: Internal Medicine

## 2023-05-02 ENCOUNTER — Encounter
Admit: 2023-05-02 | Payer: PRIVATE HEALTH INSURANCE | Attending: Student in an Organized Health Care Education/Training Program | Primary: Internal Medicine

## 2023-05-02 DIAGNOSIS — R0602 Shortness of breath: Secondary | ICD-10-CM

## 2023-05-02 NOTE — Telephone Encounter
 Hi Sam,Pended order for pft for app to sign.Please schedule with upcoming appointment.Thank you.Electronically Signed by Jabier Gauss, RN, May 02, 2023

## 2023-05-02 NOTE — Telephone Encounter
 Patient called to make a return appt with Dr Ginnie Smart, she said her PCP told her to make an appt for breathing test.No active request in chart.  Please place order and let admin know if needed.

## 2023-05-14 ENCOUNTER — Inpatient Hospital Stay: Admit: 2023-05-14 | Discharge: 2023-05-14 | Payer: PRIVATE HEALTH INSURANCE | Primary: Internal Medicine

## 2023-05-14 DIAGNOSIS — D869 Sarcoidosis, unspecified: Secondary | ICD-10-CM

## 2023-05-14 DIAGNOSIS — Z7689 Persons encountering health services in other specified circumstances: Secondary | ICD-10-CM

## 2023-05-14 DIAGNOSIS — R7303 Prediabetes: Secondary | ICD-10-CM

## 2023-05-14 LAB — CBC WITH AUTO DIFFERENTIAL
BKR WAM ABSOLUTE IMMATURE GRANULOCYTES.: 0.01 x 1000/ÂµL (ref 0.00–0.30)
BKR WAM ABSOLUTE LYMPHOCYTE COUNT.: 1.83 x 1000/ÂµL (ref 0.60–3.70)
BKR WAM ABSOLUTE NRBC (2 DEC): 0 x 1000/ÂµL (ref 0.00–1.00)
BKR WAM ANC (ABSOLUTE NEUTROPHIL COUNT): 3.28 x 1000/ÂµL (ref 2.00–7.60)
BKR WAM BASOPHIL ABSOLUTE COUNT.: 0.06 x 1000/ÂµL (ref 0.00–1.00)
BKR WAM BASOPHILS: 1 % (ref 0.0–1.4)
BKR WAM EOSINOPHIL ABSOLUTE COUNT.: 0.58 x 1000/ÂµL (ref 0.00–1.00)
BKR WAM EOSINOPHILS: 9.2 % — ABNORMAL HIGH (ref 0.0–5.0)
BKR WAM HEMATOCRIT (2 DEC): 39 % (ref 35.00–45.00)
BKR WAM HEMOGLOBIN: 12.7 g/dL (ref 11.7–15.5)
BKR WAM IMMATURE GRANULOCYTES: 0.2 % (ref 0.0–1.0)
BKR WAM LYMPHOCYTES: 29 % (ref 17.0–50.0)
BKR WAM MCH (PG): 27.9 pg (ref 27.0–33.0)
BKR WAM MCHC: 32.6 g/dL (ref 31.0–36.0)
BKR WAM MCV: 85.5 fL (ref 80.0–100.0)
BKR WAM MONOCYTE ABSOLUTE COUNT.: 0.55 x 1000/ÂµL (ref 0.00–1.00)
BKR WAM MONOCYTES: 8.7 % (ref 4.0–12.0)
BKR WAM MPV: 9.4 fL (ref 8.0–12.0)
BKR WAM NEUTROPHILS: 51.9 % (ref 39.0–72.0)
BKR WAM NUCLEATED RED BLOOD CELLS: 0 % (ref 0.0–1.0)
BKR WAM PLATELETS: 284 x1000/ÂµL (ref 150–420)
BKR WAM RDW-CV: 13.6 % (ref 11.0–15.0)
BKR WAM RED BLOOD CELL COUNT.: 4.56 M/ÂµL (ref 4.00–6.00)
BKR WAM WHITE BLOOD CELL COUNT: 6.3 x1000/ÂµL (ref 4.0–11.0)

## 2023-05-14 LAB — COMPREHENSIVE METABOLIC PANEL
BKR A/G RATIO: 1.1 (ref 1.0–2.2)
BKR ALANINE AMINOTRANSFERASE (ALT): 41 U/L — ABNORMAL HIGH (ref 10–35)
BKR ALBUMIN: 4 g/dL (ref 3.6–5.1)
BKR ALKALINE PHOSPHATASE: 147 U/L — ABNORMAL HIGH (ref 9–122)
BKR ANION GAP: 12 (ref 7–17)
BKR ASPARTATE AMINOTRANSFERASE (AST): 34 U/L (ref 10–35)
BKR AST/ALT RATIO: 0.8
BKR BILIRUBIN TOTAL: 0.4 mg/dL (ref <=1.2)
BKR BLOOD UREA NITROGEN: 8 mg/dL (ref 6–20)
BKR BUN / CREAT RATIO: 14 (ref 8.0–23.0)
BKR CALCIUM: 10 mg/dL (ref 8.8–10.2)
BKR CHLORIDE: 104 mmol/L (ref 98–107)
BKR CO2: 26 mmol/L (ref 20–30)
BKR CREATININE DELTA: -0.07
BKR CREATININE: 0.57 mg/dL (ref 0.40–1.30)
BKR EGFR, CREATININE (CKD-EPI 2021): >60 mL/min/{1.73_m2} (ref >=60)
BKR GLOBULIN: 3.6 g/dL (ref 2.0–3.9)
BKR GLUCOSE: 107 mg/dL — ABNORMAL HIGH (ref 70–100)
BKR POTASSIUM: 4.2 mmol/L (ref 3.3–5.3)
BKR PROTEIN TOTAL: 7.6 g/dL (ref 5.9–8.3)
BKR SODIUM: 142 mmol/L (ref 136–144)

## 2023-05-14 LAB — LIPID PANEL
BKR CHOLESTEROL/HDL RATIO: 3.7 (ref 0.0–5.0)
BKR CHOLESTEROL: 189 mg/dL
BKR HDL CHOLESTEROL: 51 mg/dL (ref >=40)
BKR LDL CHOLESTEROL SAMPSON CALCULATED: 123 mg/dL — ABNORMAL HIGH
BKR TRIGLYCERIDES: 84 mg/dL

## 2023-05-14 LAB — SEDIMENTATION RATE (ESR): BKR SEDIMENTATION RATE, ERYTHROCYTE: 47 mm/h — ABNORMAL HIGH (ref 0–20)

## 2023-05-14 LAB — C-REACTIVE PROTEIN     (CRP): BKR C-REACTIVE PROTEIN, HIGH SENSITIVITY: 12.3 mg/L — ABNORMAL HIGH

## 2023-05-15 LAB — HEMOGLOBIN A1C
BKR ESTIMATED AVERAGE GLUCOSE: 143 mg/dL
BKR HEMOGLOBIN A1C: 6.6 % — ABNORMAL HIGH (ref 4.0–5.6)

## 2023-05-16 NOTE — Other
 I spoke with Cathy Evans about the results of her most recent labs and referred that her HbA1c  (6.6) now represents T2DM. I told her that we would need to start medication for this diagnosis. Because she has obesity she would be an appropriate candidate for GLP1 agonist and I have sent a prescription for tirzepatide 2.5 mg injector to her pharmacy. I have also asked her kindly to please call the clinic in order to schedule an UV to discuss the results of her this new diagnosis.  I ordered a urinary microalbumin as is indicated upon new diagnosis of T2DM. She will need referral for diabetic eye and foot exam on her next visit. I also provided her with a refill for her rosuvastatin 10 mg which she endorsed she was not taking because she thought she would manage with diet alone. She endorsed that her BP at home was usually 147 and will also likely need treatment for HTN depending on her BP on her next week appointment. Moreover, she was endorsing concerns about enlarged lymph nodes and about her urine smelling like oranges. I have ordered a urine analysis along with her urinary microalbumin.

## 2023-05-19 ENCOUNTER — Inpatient Hospital Stay: Admit: 2023-05-19 | Payer: PRIVATE HEALTH INSURANCE | Primary: Internal Medicine

## 2023-05-20 ENCOUNTER — Ambulatory Visit: Admit: 2023-05-20 | Payer: PRIVATE HEALTH INSURANCE | Primary: Internal Medicine

## 2023-05-20 ENCOUNTER — Encounter: Admit: 2023-05-20 | Payer: PRIVATE HEALTH INSURANCE | Attending: Rheumatology | Primary: Internal Medicine

## 2023-05-20 NOTE — Other
 CRP 12.3 from 10.2CMP notable for ALT 41, alk phos 147AST normal ESR 47 from 26CBC notable for eos 9.2, from 9.3

## 2023-05-21 ENCOUNTER — Ambulatory Visit: Admit: 2023-05-21 | Payer: PRIVATE HEALTH INSURANCE | Attending: Rheumatology | Primary: Internal Medicine

## 2023-05-21 ENCOUNTER — Encounter: Admit: 2023-05-21 | Payer: PRIVATE HEALTH INSURANCE | Attending: Rheumatology | Primary: Internal Medicine

## 2023-05-21 ENCOUNTER — Inpatient Hospital Stay: Admit: 2023-05-21 | Discharge: 2023-05-21 | Payer: PRIVATE HEALTH INSURANCE | Primary: Internal Medicine

## 2023-05-21 ENCOUNTER — Encounter: Admit: 2023-05-21 | Payer: PRIVATE HEALTH INSURANCE | Attending: Cardiovascular Disease | Primary: Internal Medicine

## 2023-05-21 DIAGNOSIS — E785 Hyperlipidemia, unspecified: Secondary | ICD-10-CM

## 2023-05-21 DIAGNOSIS — D869 Sarcoidosis, unspecified: Secondary | ICD-10-CM

## 2023-05-21 DIAGNOSIS — E119 Type 2 diabetes mellitus without complications: Secondary | ICD-10-CM

## 2023-05-21 LAB — URINALYSIS-MACROSCOPIC W/REFLEX MICROSCOPIC
BKR BILIRUBIN, UA: NEGATIVE
BKR BLOOD, UA: NEGATIVE
BKR GLUCOSE, UA: NEGATIVE
BKR KETONES, UA: NEGATIVE
BKR LEUKOCYTE ESTERASE, UA: NEGATIVE
BKR NITRITE, UA: POSITIVE — AB
BKR PH, UA: 6.5 (ref 5.5–7.5)
BKR PROTEIN, UA: NEGATIVE
BKR SPECIFIC GRAVITY, UA: 1.012 (ref 1.005–1.030)
BKR UROBILINOGEN, UA (MG/DL): <2 mg/dL (ref <=2.0)

## 2023-05-21 LAB — LIPID PANEL
BKR CHOLESTEROL/HDL RATIO: 4.2 (ref 0.0–5.0)
BKR CHOLESTEROL: 200 mg/dL — ABNORMAL HIGH
BKR HDL CHOLESTEROL: 48 mg/dL (ref >=40)
BKR LDL CHOLESTEROL SAMPSON CALCULATED: 138 mg/dL — ABNORMAL HIGH
BKR TRIGLYCERIDES: 75 mg/dL

## 2023-05-21 LAB — CBC WITH AUTO DIFFERENTIAL
BKR WAM ABSOLUTE IMMATURE GRANULOCYTES.: 0.02 x 1000/ÂµL (ref 0.00–0.30)
BKR WAM ABSOLUTE LYMPHOCYTE COUNT.: 2.02 x 1000/ÂµL (ref 0.60–3.70)
BKR WAM ABSOLUTE NRBC (2 DEC): 0 x 1000/ÂµL (ref 0.00–1.00)
BKR WAM ANC (ABSOLUTE NEUTROPHIL COUNT): 2.58 x 1000/ÂµL (ref 2.00–7.60)
BKR WAM BASOPHIL ABSOLUTE COUNT.: 0.05 x 1000/ÂµL (ref 0.00–1.00)
BKR WAM BASOPHILS: 0.9 % (ref 0.0–1.4)
BKR WAM EOSINOPHIL ABSOLUTE COUNT.: 0.48 x 1000/ÂµL (ref 0.00–1.00)
BKR WAM EOSINOPHILS: 8.5 % — ABNORMAL HIGH (ref 0.0–5.0)
BKR WAM HEMATOCRIT (2 DEC): 38.1 % (ref 35.00–45.00)
BKR WAM HEMOGLOBIN: 12.7 g/dL (ref 11.7–15.5)
BKR WAM IMMATURE GRANULOCYTES: 0.4 % (ref 0.0–1.0)
BKR WAM LYMPHOCYTES: 35.6 % (ref 17.0–50.0)
BKR WAM MCH (PG): 28.2 pg (ref 27.0–33.0)
BKR WAM MCHC: 33.3 g/dL (ref 31.0–36.0)
BKR WAM MCV: 84.5 fL (ref 80.0–100.0)
BKR WAM MONOCYTE ABSOLUTE COUNT.: 0.52 x 1000/ÂµL (ref 0.00–1.00)
BKR WAM MONOCYTES: 9.2 % (ref 4.0–12.0)
BKR WAM MPV: 9.1 fL (ref 8.0–12.0)
BKR WAM NEUTROPHILS: 45.4 % (ref 39.0–72.0)
BKR WAM NUCLEATED RED BLOOD CELLS: 0 % (ref 0.0–1.0)
BKR WAM PLATELETS: 289 x1000/ÂµL (ref 150–420)
BKR WAM RDW-CV: 13.4 % (ref 11.0–15.0)
BKR WAM RED BLOOD CELL COUNT.: 4.51 M/ÂµL (ref 4.00–6.00)
BKR WAM WHITE BLOOD CELL COUNT: 5.7 x1000/ÂµL (ref 4.0–11.0)

## 2023-05-21 LAB — COMPREHENSIVE METABOLIC PANEL
BKR A/G RATIO: 1.1 (ref 1.0–2.2)
BKR ALANINE AMINOTRANSFERASE (ALT): 35 U/L (ref 10–35)
BKR ALBUMIN: 4 g/dL (ref 3.6–5.1)
BKR ALKALINE PHOSPHATASE: 135 U/L — ABNORMAL HIGH (ref 9–122)
BKR ANION GAP: 11 (ref 7–17)
BKR ASPARTATE AMINOTRANSFERASE (AST): 35 U/L (ref 10–35)
BKR AST/ALT RATIO: 1
BKR BILIRUBIN TOTAL: 0.4 mg/dL (ref <=1.2)
BKR BLOOD UREA NITROGEN: 9 mg/dL (ref 6–20)
BKR BUN / CREAT RATIO: 13.6 (ref 8.0–23.0)
BKR CALCIUM: 9.8 mg/dL (ref 8.8–10.2)
BKR CHLORIDE: 105 mmol/L (ref 98–107)
BKR CO2: 26 mmol/L (ref 20–30)
BKR CREATININE DELTA: 0.09
BKR CREATININE: 0.66 mg/dL (ref 0.40–1.30)
BKR EGFR, CREATININE (CKD-EPI 2021): >60 mL/min/{1.73_m2} (ref >=60)
BKR GLOBULIN: 3.5 g/dL (ref 2.0–3.9)
BKR GLUCOSE: 97 mg/dL (ref 70–100)
BKR POTASSIUM: 4.3 mmol/L (ref 3.3–5.3)
BKR PROTEIN TOTAL: 7.5 g/dL (ref 5.9–8.3)
BKR SODIUM: 142 mmol/L (ref 136–144)

## 2023-05-21 LAB — ALBUMIN/CREATININE PANEL, URINE, RANDOM
BKR ALBUMIN, URINE, RANDOM: <12 mg/L
BKR CREATININE, URINE, RANDOM: 62 mg/dL

## 2023-05-21 LAB — SEDIMENTATION RATE (ESR): BKR SEDIMENTATION RATE, ERYTHROCYTE: 79 mm/h — ABNORMAL HIGH (ref 0–20)

## 2023-05-21 LAB — URINE MICROSCOPIC     (BH GH LMW YH)
BKR RBC/HPF INSTRUMENT: <1 /HPF (ref 0–2)
BKR URINE SQUAMOUS EPITHELIAL CELLS, UA (NUMERIC): 1 /HPF (ref 0–5)
BKR WBC/HPF INSTRUMENT: 1 /HPF (ref 0–5)

## 2023-05-21 LAB — C-REACTIVE PROTEIN     (CRP): BKR C-REACTIVE PROTEIN, HIGH SENSITIVITY: 10.4 mg/L — ABNORMAL HIGH

## 2023-05-22 NOTE — Other
 Hi Dr Kari Baars, I have forwarded Mrs. Donigan's labs to Daleen Snook who will be seeing her on urgent visits tomorrow. Positive nitrites suggest possible UTI. No albuminuria. May need a urine culture done tomorrow after her visit (unfortunately ordered the U/A with reflex microscopy instead of reflex culture).

## 2023-05-25 NOTE — Other
 Labs reviewedCRP 10.4 from 12.3CMP notable for Alk phos 135 from 147ESR 79 from 47CBC showed eos 8.5 from 9.2%

## 2023-05-25 NOTE — Other
 Hi Ms. Liat, [place] LDL cholesterol (bad cholesterol) remains elevated. We spoke one month ago and you said you were going to restart the cholesterol medication. Did you restart? Otherwise I recommend that you consider restarting.Best,Dr. Reece Agar

## 2023-05-28 ENCOUNTER — Emergency Department: Admit: 2023-05-28 | Payer: PRIVATE HEALTH INSURANCE | Primary: Internal Medicine

## 2023-05-28 ENCOUNTER — Inpatient Hospital Stay: Admit: 2023-05-28 | Discharge: 2023-05-28 | Payer: PRIVATE HEALTH INSURANCE | Primary: Internal Medicine

## 2023-05-28 DIAGNOSIS — Z043 Encounter for examination and observation following other accident: Principal | ICD-10-CM

## 2023-05-28 DIAGNOSIS — R8290 Unspecified abnormal findings in urine: Secondary | ICD-10-CM

## 2023-05-28 LAB — URINALYSIS WITH CULTURE REFLEX      (BH LMW YH)
BKR BILIRUBIN, UA: NEGATIVE
BKR BLOOD, UA: NEGATIVE
BKR GLUCOSE, UA: NEGATIVE
BKR KETONES, UA: NEGATIVE
BKR NITRITE, UA: POSITIVE — AB
BKR PH, UA: 6 (ref 5.5–7.5)
BKR PROTEIN, UA: NEGATIVE
BKR SPECIFIC GRAVITY, UA: 1.012 (ref 1.005–1.030)
BKR UROBILINOGEN, UA (MG/DL): <2 mg/dL (ref <=2.0)

## 2023-05-28 LAB — URINE MICROSCOPIC     (BH GH LMW YH)
BKR RBC/HPF INSTRUMENT: <1 /HPF (ref 0–2)
BKR URINE SQUAMOUS EPITHELIAL CELLS, UA (NUMERIC): <1 /HPF (ref 0–5)
BKR WBC/HPF INSTRUMENT: 5 /HPF (ref 0–5)

## 2023-05-28 LAB — UA REFLEX CULTURE

## 2023-05-28 MED ORDER — LIDOCAINE 5 % ADHESIVE PATCH
5 % | MEDICATED_PATCH | TRANSDERMAL | 1 refills | Status: AC
Start: 2023-05-28 — End: ?

## 2023-05-28 MED ORDER — LIDOCAINE 4 % TOPICAL PATCH
4 % | Freq: Once | TRANSDERMAL | Status: DC
Start: 2023-05-28 — End: 2023-05-29

## 2023-05-28 MED ORDER — ACETAMINOPHEN 325 MG TABLET
325 mg | Freq: Once | ORAL | Status: DC
Start: 2023-05-28 — End: 2023-05-29

## 2023-05-29 DIAGNOSIS — Z881 Allergy status to other antibiotic agents status: Secondary | ICD-10-CM

## 2023-05-29 DIAGNOSIS — R8290 Unspecified abnormal findings in urine: Secondary | ICD-10-CM

## 2023-05-29 DIAGNOSIS — Z880 Allergy status to penicillin: Secondary | ICD-10-CM

## 2023-05-29 DIAGNOSIS — S0083XA Contusion of other part of head, initial encounter: Secondary | ICD-10-CM

## 2023-05-29 DIAGNOSIS — R0789 Other chest pain: Secondary | ICD-10-CM

## 2023-05-29 DIAGNOSIS — Z882 Allergy status to sulfonamides status: Secondary | ICD-10-CM

## 2023-05-29 NOTE — Discharge Instructions
 Your chest x-ray and [place] scan of your head and neck are negative.  Please take Tylenol for pain, can cautiously use ibuprofen 400 mg every 4-6 hours as needed.  Please ensure you are taking this with food.  Please follow up with your primary care doctor within 2-3 days.  Please return to the emergency department if you are experiencing intractable headaches, persistent dizziness and vomiting, any numbness or tingling to your extremitiesYour history and physical exam can be due to a concussion. Concussion is a functional brain injury. This means that there is nothing wrong with the structure of your brain and that there is no obvious external injury. A concussion is defined as a head injury with symptoms afterwards, such as headache, nausea, dizziness, or trouble concentrating.The most important thing in concussion is to prevent a second concussion while recovering from the first. This means no contact sports, no bike or motorcycle riding, and no gym class for children. A person is recovered from a concussion when he or she can perform activities without feeling sick and without needing medication for symptoms.Some patient fiend that their symptoms are improved by cocooning. We do this by asking people with concussion to stop any activities that make their symptoms worse. For example, if a person is watching TV and they begin to feel a worsening headache they should turn off the TV and lay down in the dark. There is ongoing research about how long a person should practice cocooning before returning to normal activities. We are not sure about the optimal time to rest and avoid injury. Please contact your primary care doctor to arrange for concussion follow up and return to play instructions.It is fine to use over the counter medications to relieve symptoms. Medicines like ibuprofen (Motrin or Advil) or acetaminophen (Tylenol) can be used alone or in combination. If you need medications to control symptoms you have not recovered yet and must take care to avoid another head injury.If you experience ongoing symptoms, you should discuss a neurology referral with your primary care doctor. Please return to the Emergency Department for worsening or concerning symptoms.

## 2023-05-29 NOTE — ED Provider Notes
 Chief Complaint Patient presents with  Fall   Trip and fall up the stairs just prior to arrival, struck her forehead, chest, and left arm on the stairs. Pt with multiple abrasions to hands and forehead. Pt reports chest soreness where she struck it, worse with palpation.  HPI:43F PMH CKD, GERD, HTN, MV regurgitation, PCKD, sickle cell trait presenting s/p fall.  Patient states she was walking up 2 steps and tripped.  Denies lightheadedness or dizziness prior to the fall.  She endorses positive history, denies LOC your blood thinning medication.  She states that she also landed on her chest and complains of sternal and right-sided chest pain.  She denies shortness of breath.  She states she was unable to get herself up and had to wait for EMS.  She denies any other injuries.  She denies any lightheadedness, dizziness, nausea, vomiting.  On exam, patient is in no acute distress, well-appearing.  She is afebrile, satting 98% on room air, non tachycardic.  She has a small abrasion to the right side of her forehead, tender to palpation.  Pupils PERRLA, EOMI.  Patient is slow following hand to nose coordination, denies any drug or alcohol on board.  Strength equal upper and lower extremities, sensation intact.  Lungs clear to auscultation, normal S1-S2, sternal and right-sided chest tenderness, no crepitus.  Doubt ICH however given patient is having difficulty with coordination in the setting of a positive head strike, we will obtain [place] head and neck to rule out ICH, fracture.  Obtain chest x-ray to rule out fracture.  We will treat pain with Tylenol and lidocaine patch pending results of [place] scan.Physical ExamConstitutional:     General: She is not in acute distress.   Appearance: Normal appearance. She is not toxic-appearing. HENT:    Head: Normocephalic.    Comments: Small abrasion and hematoma to right forehead   Right Ear: Tympanic membrane normal.    Left Ear: Tympanic membrane normal. Mouth/Throat:    Mouth: Mucous membranes are moist.    Pharynx: Oropharynx is clear. Eyes:    Extraocular Movements: Extraocular movements intact.    Pupils: Pupils are equal, round, and reactive to light. Cardiovascular:    Rate and Rhythm: Normal rate and regular rhythm.    Pulses: Normal pulses. Chest:    Chest wall: Tenderness (Midsternal and right-sided) present. Abdominal:    Palpations: Abdomen is soft. Musculoskeletal:       General: No swelling or tenderness. Normal range of motion.    Right lower leg: No edema.    Left lower leg: No edema. Skin:   General: Skin is warm and dry. Neurological:    Mental Status: She is alert and oriented to person, place, and time.    Coordination: Coordination abnormal (Slow following finger-to-nose). ED Triage Vitals [05/28/23 1902]BP: (!) 138/96Pulse: (!) 99Pulse from  O2 sat: n/aResp: 18Temp: 97.8 ?F (36.6 ?C)Temp src: OralSpO2: 98 % BP (!) 138/96  - Pulse (!) 99  - Temp 97.8 ?F (36.6 ?C) (Oral)  - Resp 18  - LMP 08/30/2017 (Exact Date) Comment: hyst 08/30/2017 - SpO2 98% Ddx:  Mechanical fall, mild concussion, contusion, low suspicion ICH, fracturePlan: -  head and neck non con- chest x-ray- Tylenol, lidocaine patchPertinent Results: - Imaging:CXR (Final result)Result time 05/28/23 20:43:07Final result by Tera Helper, MD (05/28/23 20:43:07)      Impression:  No focal consolidation or pneumothorax.Unchanged cardiomediastinal silhouette.[facility] Communications Center: Routine.Reported and signed by: Ellin Goodie, MD [facility] Radiology and Biomedical Imaging  Narrative:  INDICATION: Status post fall, sternal and right-sided chest painTECHNIQUE: XR CHEST PA AND LATERALCOMPARISON: XR CHEST PA AND LATERAL 2023-02-08        [place] Head Cervical Spine wo IV Contrast (Final result)Result time 05/28/23 20:51:32Final result by Orlie Pollen, MD (05/28/23 20:51:32)      Impression:  1. No evidence of acute intracranial abnormality.  2. No evidence for acute cervical spine fracture or traumatic subluxation.Please note that Noncontrast Head [place] is not sensitive for the detection of ischemic infarct. If ischemic infarct is of clinical concern, additional clinical or imaging evaluation is recommended. Radiology Notify System Classification: Routine.Report initiated by:  Ozzie Hoyle, MDReported and signed by: Orlie Pollen, MD [facility] Radiology and Biomedical Imaging    Narrative:  [place] HEAD CERVICAL SPINE WO IV CONTRAST ([facility])INDICATION: Status post falll, + head strike, no LOC.COMPARISON: MRI 7/13/2005TECHNIQUE: [place] images were obtained from the vertex through T1 without intravenous contrast. Coronal and sagittal multiplanar reformatted images were provided.FINDINGS:Brain:There is no intracranial hemorrhage, edema, mass effect or midline shift. There is no evidence of acute major vascular distribution infarct. The ventricles and sulci are symmetric and normal in size. The basal cisterns are patent.There is left maxillary sinus mucosal polyp. The visualized orbits and osseous structures are unremarkable.Cervical Spine:There is no compression deformity or evidence for acute fracture or subluxation. The atlanto-occipital and atlanto-axial articulations are intact. Mild cervical spondylosis with marginal osteophytosis multilevel degenerative disc disease most prominent at C5-C6.The prevertebral soft tissues are within normal limits.The visualized lung apices are clear.    ED Course:Imaging reviewed and negative. Discussed with patient. Strict return precautions discussed Disposition:  PCP follow up in 2-3 days Strict return precautions discussedDr. Beatrice Lecher available for Romero Liner, PA-CEmergency Medicine Available on [facility] MDM ProceduresAttestation/Critical CareClinical Impressions as of 05/28/23 2055 Fall, initial encounter  ED DispositionDischarge  Sheilah Mins, PA01/18/25 2056

## 2023-05-30 LAB — URINE CULTURE: BKR URINE CULTURE, ROUTINE: >=100000 — AB

## 2023-05-30 NOTE — Other
 Called patient to notify her of final urine culture results>>klepsiella pneum. Sensitive to bactrim and cipro. She has numerous allergies including pcn and sulfa. Additionally with rash to moxafloxacin however review of epic shows that she has received this as recently as June 2024.

## 2023-05-31 ENCOUNTER — Ambulatory Visit: Admit: 2023-05-31 | Payer: PRIVATE HEALTH INSURANCE | Primary: Internal Medicine

## 2023-06-04 ENCOUNTER — Ambulatory Visit: Admit: 2023-06-04 | Payer: PRIVATE HEALTH INSURANCE | Primary: Internal Medicine

## 2023-06-06 ENCOUNTER — Ambulatory Visit: Admit: 2023-06-06 | Payer: PRIVATE HEALTH INSURANCE | Primary: Internal Medicine

## 2023-06-08 ENCOUNTER — Ambulatory Visit: Admit: 2023-06-08 | Payer: PRIVATE HEALTH INSURANCE | Primary: Internal Medicine

## 2023-06-09 ENCOUNTER — Ambulatory Visit
Admit: 2023-06-09 | Payer: PRIVATE HEALTH INSURANCE | Attending: Student in an Organized Health Care Education/Training Program | Primary: Internal Medicine

## 2023-06-09 ENCOUNTER — Inpatient Hospital Stay: Admit: 2023-06-09 | Discharge: 2023-06-09 | Payer: PRIVATE HEALTH INSURANCE | Primary: Internal Medicine

## 2023-06-09 ENCOUNTER — Encounter
Admit: 2023-06-09 | Payer: PRIVATE HEALTH INSURANCE | Attending: Student in an Organized Health Care Education/Training Program | Primary: Internal Medicine

## 2023-06-09 VITALS — Ht 63.819 in | Wt 182.1 lb

## 2023-06-09 VITALS — BP 137/88 | HR 87 | Temp 97.90000°F | Resp 18 | Ht 63.0 in | Wt 182.1 lb

## 2023-06-09 DIAGNOSIS — K219 Gastro-esophageal reflux disease without esophagitis: Secondary | ICD-10-CM

## 2023-06-09 DIAGNOSIS — Q613 Polycystic kidney, unspecified: Secondary | ICD-10-CM

## 2023-06-09 DIAGNOSIS — R002 Palpitations: Secondary | ICD-10-CM

## 2023-06-09 DIAGNOSIS — Z87828 Personal history of other (healed) physical injury and trauma: Secondary | ICD-10-CM

## 2023-06-09 DIAGNOSIS — R0602 Shortness of breath: Secondary | ICD-10-CM

## 2023-06-09 DIAGNOSIS — I340 Nonrheumatic mitral (valve) insufficiency: Secondary | ICD-10-CM

## 2023-06-09 DIAGNOSIS — D869 Sarcoidosis, unspecified: Secondary | ICD-10-CM

## 2023-06-09 DIAGNOSIS — E559 Vitamin D deficiency, unspecified: Secondary | ICD-10-CM

## 2023-06-09 DIAGNOSIS — K200 Eosinophilic esophagitis: Secondary | ICD-10-CM

## 2023-06-09 DIAGNOSIS — N189 Chronic kidney disease, unspecified: Secondary | ICD-10-CM

## 2023-06-09 DIAGNOSIS — I10 Essential (primary) hypertension: Secondary | ICD-10-CM

## 2023-06-09 DIAGNOSIS — D573 Sickle-cell trait: Secondary | ICD-10-CM

## 2023-06-09 MED ORDER — CYCLOBENZAPRINE 10 MG TABLET
10 | Freq: Every evening | ORAL | Status: AC
Start: 2023-06-09 — End: 2023-06-13

## 2023-06-09 NOTE — Progress Notes
 [facility] Department of Internal Medicine Section of Pulmonary, Critical Care, and Sleep Medicine	[facility] [address];  [place], [place] [address]GMWNU:  [phone]  Fax: [phone], Glo Herring, MD, PhD				Medical Director, Milas Hock, MD			Associate Director, Stark Klein, MD, [facility] Ginnie Smart, MD, MScJean Rhina Brackett, MDGenta Camillia Herter, MD, MPHChangwan Elizbeth Squires, MD, MPHJonathan Siner, MDKelly Margaretmary Eddy, APRNLisa Rebmann, RNChristine Tancreti, RNPatient Name:  Cathy Evans of Birth:  [birth year]Date of Service: 1/30/2025EPIC MRN:  [id number] Provider:  Fletcher Anon Cerro-Chiang, MDI had the pleasure of seeing Cathy Evans in follow up for sarcoidosis. HPI:She was found to have mediastinal adenopathies on [place] chest as part of the workup for a neck mass. 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 At our first visit, she had some CTD related symptoms as well as palpitations. She had a Holter monitor for 2 weeks which was unrevealing. 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 LAST VISIT Telehealth (08/23/22): Today, Cathy Evans says that she has been experiencing shortness of breath, primarily located in the middle of her chest. She also notes discomfort in her throat, and endorses a cough that accompanies her shortness of breath. She says that this shortness of breath occurs when she is laying down or doing physical activity. During PT for her back, she says that when she is lying in supine position and brings her knees to her chest, she feels like her breathing is being cut off.She says that she feels her breathing has worsened since October 2023. She notes that her shortness of breath was more intermittent in October 2023, but it has now become constant.She denies heartburn and acid reflux. She has some nasal congestionCHART REVIEW:09/16/22 Rheum: low back most likely mechanical but will proceed with MRI lumbar spine to exclude sarcoid. Rx meloxicam6/18/24 Neph: Seen for cystic  kidneys and family history of ESRD. Given normal function and good prognosis, no intervention. 02/07/23 PCP : Worsening SOB and chest tightness since 2 months. Repeat TTE. F/u Cardiology and ILD clinic. 02/08/23 IM: EKG normal. Ordered CXR and labs. CXR, BNP stable12/2/24 Cards: Continues to have palpitations, has had significant weight gain. Plan for coronary CTA to assess for CAD. Could try higher beta blocker dose for palpications. 05/28/23 ED for fall from stairs. Imaging negative. CURRENT VISIT:Since her last visit, she states she has been having more SOB with activities. She feels winded when she moves around the house, making the bed, walking up to clinic makes her winded also going up a flight of stairs.Also has dry cough, usually occurs along with SOB and with deep breaths. The cough is not associated with any activities but she notices it more when she is talking for a long period of timeShe continues to get palpitations and states it occurs when she eats too much. Review of Systems Constitutional: Negative.  HENT: Negative.   Eyes: Negative.  Respiratory:  Positive for cough (dry) and shortness of breath (with activities).  Cardiovascular:  Positive for palpitations. Gastrointestinal: Negative.  Genitourinary: Negative.  Musculoskeletal: Negative.  Skin: Negative.  Neurological: Negative.  Endo/Heme/Allergies: Negative.  Psychiatric/Behavioral: Negative.   All other systems reviewed and are negative.MEDICATIONS: Current Outpatient Medications:   cyclobenzaprine, Take 1 tablet (10 mg total) by mouth nightly., Disp: , Rfl:   ciprofloxacin HCl, Take 1 tablet (250 mg total) by mouth every 12 (twelve) hours for 10 days. (Patient not taking: Reported on 06/09/2023), Disp: 20 tablet, Rfl: 0  lidocaine, Place 1 patch over 12 hours onto  the skin every 24 hours. Remove & Discard patch within 12 hours or as directed by MD (Patient not taking: Reported on 06/09/2023), Disp: 15 patch, Rfl: 0  losartan, Take 1 tablet (25 mg total) by mouth daily. (Patient not taking: Reported on 06/09/2023), Disp: 30 tablet, Rfl: 3  rosuvastatin, Take 1 tablet (10 mg total) by mouth nightly. (Patient not taking: Reported on 05/23/2023), Disp: 90 tablet, Rfl: 3  Mounjaro, Inject 1 Pen (2.5 mg total) under the skin once a week. (Patient not taking: Reported on 06/09/2023), Disp: 2 mL, Rfl: 0  triamcinolone, Apply topically 2 (two) times daily. (Patient not taking: Reported on 06/09/2023), Disp: 30 g, Rfl: 1ALLERGIES:Allergies Allergen Reactions  Penicillins Hives   Hives Has patient had a PCN reaction causing immediate rash, facial/tongue/throat swelling, SOB or lightheadedness with hypotension: YESHas patient had a PCN reaction causing severe rash involving mucus membranes or skin necrosis: NOHas patient had a PCN reaction that required hospitalization NOHas patient had a PCN reaction occurring within the last 10 years: NOIf all of the above answers are NO, then may proceed with Cephalosporin use.  Sulfa (Sulfonamide Antibiotics) Hives and Rash  Sulfamethoxazole-Trimethoprim Hives  Sulfasalazine Hives   rashrash  Moxifloxacin Rash PAST MEDICAL HISTORY:Past Medical History: Diagnosis Date  Chronic kidney disease   PKD, but normal creatinine  Eosinophilic esophagitis 08/26/2020  GERD (gastroesophageal reflux disease)   History of torn meniscus of right knee 02/2022  Hypertension   Mitral valve regurgitation   Palpitations   Polycystic kidney disease   Sickle cell trait (HC Code) (HC CODE) (HC Code)   Natera testing 2021  Vitamin D deficiency  Past Surgical History: Procedure Laterality Date  APPENDECTOMY  1984 COLONOSCOPY    DILATION AND CURETTAGE OF UTERUS    HYSTERECTOMY  08/30/2017  LAPAROSCOPY    LEEP    TONSILLECTOMY    TUBAL LIGATION    UPPER GASTROINTESTINAL ENDOSCOPY   PHYSICAL EXAMINATION:Vital signs:  BP 137/88 (Site: r a, Position: Sitting, Cuff Size: Medium)  - Pulse 87  - Temp 97.9 ?F (36.6 ?C) (Temporal)  - Resp 18  - Ht 5' 3  - Wt 182 lb 1.6 oz  - LMP 08/30/2017 (Exact Date) Comment: hyst 08/30/2017 - SpO2 98%  - BMI 32.26 kg/m?  Weight 182.1 lbs. on RAPhysical ExamConstitutional:     Appearance: Normal appearance. HENT:    Head: Normocephalic.    Mouth/Throat:    Mouth: Mucous membranes are moist.    Pharynx: Oropharynx is clear. No oropharyngeal exudate or posterior oropharyngeal erythema. Cardiovascular:    Rate and Rhythm: Normal rate and regular rhythm.    Pulses: Normal pulses.    Heart sounds: No murmur heard.Pulmonary:    Effort: Pulmonary effort is normal. No respiratory distress.    Breath sounds: No stridor. No wheezing. Musculoskeletal:       General: No swelling, deformity or signs of injury. Skin:   General: Skin is warm and dry.    Capillary Refill: Capillary refill takes less than 2 seconds.    Findings: No erythema. Neurological:    General: No focal deficit present.    Mental Status: She is alert and oriented to person, place, and time. Psychiatric:       Mood and Affect: Mood normal.       Behavior: Behavior normal. REVIEW OF DATA:Pulmonary Function Testing: Date FVC FEV1 FEV1/FVC TLC DLCO  01/21/2022 2.46 (73%) 1.96 (73%) 80% 3.44 (66%) 15.95 (78%) 02/04/23 2.02 (65) 1.62 (64) 80% 3.35 (64) 16.33 (81) 06/09/23  2.17 (71) 1.62 (66) 75% 2.85 (56) 17.06 (85)  Personal Interpretation:  No obstruction, moderate restriction, normal DLCO. FVC and DLCO are improvedChest Radiology: 10/18/21 [place] ChestLungs/Airways/Pleura: Central tracheobronchial tree is patent. Minimal tree-in-bud and groundglass opacities in the right lower lobe, likely infectious or inflammatory etiology (4:367). No reticulations, bronchiectasis, honeycombing, architectural distortion or other evidence of interstitial lung disease. No suspicious pulmonary nodules or masses. No pleural effusion or pneumothorax.Mediastinum/Lymph nodes: Left subclavicular, superior mediastinal, right paratracheal, prevascular, bilateral hilar and subcarinal adenopathy. Largest nodes measure up to 14 mm in the subcarinal region.[place] Chest 4/22/24Slight increase in the size of mediastinal adenopathy compared to study from June 2023. No change in lung findings including a few perilymphatic nodules. No significant bronchiectasis or architectural distortion in the lungs.  Other Radiology/Studies:[place] AP 6/23/23The liver, gallbladder, spleen, pancreas, and adrenal glands are unremarkable, except for tiny hepatic cysts.  Again seen are multiple bilateral renal cysts and hypodensities too small to characterize, including a 2.9 cm lesion in the right kidney that likely represents a cyst with new hemorrhagic/proteinaceous debris (image 39, series 2) that has increased from 2.0 cm. There is no bowel obstruction, ascites, or lymphadenopathy. No aggressive osseous lesions are seen.  Pathology:7/11/23Lung, right mainstem, endobronchial biopsy:       - Superficial fragments of bronchial mucosa with no significant abnormality.  FNA lung1.  RIGHT MIDDLE LOBE BRONCHOALVEOLAR LAVAGE:      - NEGATIVE FOR MALIGNANT CELLS.      - REACTIVE PULMONARY MACROPHAGES.      - GMS STAIN IS NEGATIVE FOR FUNGUS/PJP.      - AFB STAIN IS NEGATIVE FOR MYCOBACTERIA. 2.  LYMPH NODE, 4R, FINE NEEDLE ASPIRATION/WASH:      - NEGATIVE FOR MALIGNANT CELLS.           - REACTIVE LYMPHOCYTES AND FOCAL LYMPHOHISTIOCYTIC AGGREGATES. 3.  LYMPH NODE, 10R, FINE NEEDLE ASPIRATION/WASH:      - NEGATIVE FOR MALIGNANT CELLS.           - REACTIVE LYMPHOCYTES AND FOCAL LYMPHOHISTIOCYTIC AGGREGATES. 4.  LYMPH NODE, 4L, FINE NEEDLE ASPIRATION/WASH:      - NEGATIVE FOR MALIGNANT CELLS.      - FOCAL NON-CASEATING GRANULOMATOUS INFLAMMATION.      - GMS STAIN IS NEGATIVE FOR FUNGUS/PJP.      - AFB STAIN IS NEGATIVE FOR MYCOBACTERIA.  Cardiac studies:07/11/20 * Normal left ventricular size, wall thickness, systolic function and wall motion. LVEF calculated by 3DE was 69%.  Diastolic function was difficult to determine due to mitral valve abnormalities.* Normal right ventricular cavity size and systolic function.  Right ventricular systolic pressure is unable to be estimated due to insufficient Doppler signal.* Atria are normal in size.  No interatrial shunt by color Doppler.  Interatrial septum is bowed toward the left, consistent with elevated right atrial pressure.* Normal mitral valve leaflets.  Moderate posteriorly directed mitral regurgitation. By PISA, EROA PISA is 0.24 cm2 and regurgitant volume 46 ml.* All visible segments of the aorta are normal in size.* IVC diameter < 2.1 cm that collapses > 50% with a sniff suggests normal RAP (0-5 mmHg, mean 3 mmHg). Echo 03/24/23 * Normal left ventricular size, systolic function and wall motion. Mild concentric left ventricular hypertrophy.  LVEF calculated by 3DE was 63%.  Normal global longitudinal strain (-21%).  Normal diastolic function and filling pressures.* Normal right ventricular cavity size and systolic function.  Estimated right ventricular systolic pressure is 26 mmHg.* Mild mitral regurgitation.* Mild tricuspid regurgitation.* Normal sinuses of Valsalva  with a diameter of 2.9 cm and normal ascending aorta with a diameter of 3.0 cm.* IVC diameter < 2.1 cm that collapses > 50% with a sniff suggests normal RAP (0-5 mmHg, mean 3 mmHg).Labs:10/2021:CRP 5.1, ESR 34Neg ACE, RFNormal complementIgG: 1625, remainder unremarkableANA > 1:2560 (nuclear dots) 1:160 speckled. DsDNA, RNP, SSA, SSB, Smith, all negative 12/2019ANA     1:1280 nuclear, multiple nuclear dotsdsDNA Negative Scl-70  Negative SSA     NegativeSSB     NegativeJo-1     NegativeCCP     <16RF IgM            10ANCA  NegativeSm Ab NegativeMito Ab            Negative C3        161C4        42IMPRESSION:Cathy Evans is a 58 y.o. 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 On [place] scan she has no evidence of parenchymal involvement from sarcoidosis.  She is evidence of restriction on pulmonary function tests and I believe these could be due to neuromuscular weakness given that her parenchymal findings are quite minimal.  RECOMMENDATIONS:SarcoidosisPathology: EBUS with FNA showing non caseating granulomasOrgan involvement: LADsPrimary target organ for therapy: none currentlyCurrent treatment: nonePast treatments: noneFurther work-up:[place] to evaluate her parenchyma given worsening dyspnea and cough, and her decline in PFTs. Overall she would like to defer treatmentAnnual vitamin D levels. Continue  daily sinus rinses followed by flonase Please do not hesitate to call me if you would like to discuss Cathy Evans's care.Sincerely,Myranda Pavone Ginnie Smart, MD, MScAssistant ProfessorYale Interstitial Lung Disease CenterSection of Pulmonary and Critical Care MedicineYale School of MedicineOn the day of this patient's encounter, a total of 30 minutes was personally spent by me, which includes time spent on chart and records review, review of radiological images, medical consultation, education, coordination of care/services , counseling and chart documentation.This does not include any resident/fellow teaching time, or any time spent performing a procedural service.Scribed for Cerro-Chiang, Feliciana Rossetti, MD by Minta Balsam, medical scribe January 30, 2025The documentation recorded by the scribe accurately reflects the services I personally performed and the decisions made by me. I reviewed and confirmed all material entered and/or pre-charted by the scribe.

## 2023-06-09 NOTE — Patient Instructions
 It was a pleasure to meet you in clinic today.  Today we discussed your diagnosis of sarcoidosisThe immediate plan includes the following studies and/or referrals:Bloodwork todayCT scan next week ( I will message you with the results)Lung function tests in 12 monthsPlease keep me updated on any changes in your breathing and any acute illnesses, particularly if hospitalization is required. Our goal at the [facility] is to provide you comprehensive care and support from diagnosis onward.[facility] for [facility]: [phone] Pulmonary Function Test Lab: [phone] Radiology (to schedule [place] scans): [phone]

## 2023-06-15 ENCOUNTER — Inpatient Hospital Stay: Admit: 2023-06-15 | Discharge: 2023-06-15 | Payer: PRIVATE HEALTH INSURANCE | Primary: Internal Medicine

## 2023-06-15 DIAGNOSIS — R590 Localized enlarged lymph nodes: Secondary | ICD-10-CM

## 2023-06-17 ENCOUNTER — Inpatient Hospital Stay: Admit: 2023-06-17 | Discharge: 2023-06-17 | Payer: PRIVATE HEALTH INSURANCE | Primary: Internal Medicine

## 2023-06-17 DIAGNOSIS — R0609 Other forms of dyspnea: Secondary | ICD-10-CM

## 2023-06-17 DIAGNOSIS — E785 Hyperlipidemia, unspecified: Secondary | ICD-10-CM

## 2023-06-28 NOTE — Other
 Could you let patient know that regarding the next ultrasound>>no significant abnormality noted, we should continue to monitor. Thank you

## 2023-07-01 NOTE — Other
 Writer contacted and informed patient that per the next ultrasound the PCP will continue to monitor ,there isn't any significant abnormalities noted.

## 2023-07-03 ENCOUNTER — Inpatient Hospital Stay: Admit: 2023-07-03 | Discharge: 2023-07-03 | Payer: PRIVATE HEALTH INSURANCE | Primary: Internal Medicine

## 2023-07-03 DIAGNOSIS — D869 Sarcoidosis, unspecified: Secondary | ICD-10-CM

## 2023-07-05 ENCOUNTER — Telehealth
Admit: 2023-07-05 | Payer: PRIVATE HEALTH INSURANCE | Attending: Student in an Organized Health Care Education/Training Program | Primary: Internal Medicine

## 2023-07-05 NOTE — Telephone Encounter
 Dr. Sigmund Hazel is requesting the results of her [place] scan done on 2/23.Thank you.Electronically Signed by Jabier Gauss, RN, July 05, 2023

## 2023-07-05 NOTE — Telephone Encounter
Patient would like a call about her test results. °

## 2023-07-06 ENCOUNTER — Encounter
Admit: 2023-07-06 | Payer: PRIVATE HEALTH INSURANCE | Attending: Student in an Organized Health Care Education/Training Program | Primary: Internal Medicine

## 2023-07-13 ENCOUNTER — Ambulatory Visit
Admit: 2023-07-13 | Payer: PRIVATE HEALTH INSURANCE | Attending: Student in an Organized Health Care Education/Training Program | Primary: Internal Medicine

## 2023-07-13 DIAGNOSIS — D869 Sarcoidosis, unspecified: Secondary | ICD-10-CM

## 2023-07-13 MED ORDER — ATOVAQUONE 750 MG/5 ML ORAL SUSPENSION
7505 | Freq: Two times a day (BID) | ORAL | 2 refills | Status: AC
Start: 2023-07-13 — End: ?

## 2023-07-13 MED ORDER — PREDNISONE 20 MG TABLET
20 | ORAL_TABLET | Freq: Every day | ORAL | 1 refills | Status: AC
Start: 2023-07-13 — End: ?

## 2023-07-13 NOTE — Patient Instructions
 It was a pleasure to meet you in clinic today.  Today we discussed your diagnosis of sarcoidosisThe immediate plan includes the following studies and/or referrals:Start prednisone 30mg  dailyStart atovaquone while on prednisone to prevent infectionI will call you in 3 weeks to assess your symptoms and make a plan moving forward. Please keep me updated on any changes in your breathing and any acute illnesses, particularly if hospitalization is required. Our goal at the [facility]oidosis Disease Program is to provide you comprehensive care and support from diagnosis onward.[facility] for Lung Disease: [phone] Pulmonary Function Test Lab: [phone] Radiology (to schedule [place] scans): [phone]

## 2023-07-13 NOTE — Progress Notes
 [facility] Department of Internal Medicine Section of Pulmonary, Critical Care, and Sleep Medicine	[facility] [address]  [place], [place] [address]UEAVW:  [phone]  Fax: [phone], Glo Herring, MD, PhD				Medical Director, Milas Hock, MD			Associate Director, Stark Klein, MD, [facility] Ginnie Smart, MD, MScJean Rhina Brackett, MDGenta Camillia Herter, MD, MPHChangwan Elizbeth Squires, MD, MPHJonathan Siner, MDKelly Margaretmary Eddy, APRNLisa Rebmann, RNChristine Tancreti, RNPatient Name:  Cathy Evans of Birth:  May 30, 1967Date of Service: 3/5/2025EPIC MRN:  [id number] Provider:  Fletcher Anon Cerro-Chiang, MDI had the pleasure of seeing Jahlisa Rossitto in follow up for sarcoidosis. HPI:She was found to have mediastinal adenopathies on [place] chest as part of the workup for a neck mass. 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 At our first visit, she had some CTD related symptoms as well as palpitations. She had a Holter monitor for 2 weeks which was unrevealing. 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 LAST VISIT (06/09/23): Since her last visit, she states she has been having more SOB with activities. She feels winded when she moves around the house, making the bed, walking up to clinic makes her winded also going up a flight of stairs.Also has dry cough, usually occurs along with SOB and with deep breaths. The cough is not associated with any activities but she notices it more when she is talking for a long period of timeShe continues to get palpitations and states it occurs when she eats too much. We obtained HRCT given worsening dyspnea and cough, and decline in PFTs. CURRENT VISIT 3/5/2025Since her last visit, she states she still continues to feel short of breath, it has been mildly more than how it was in January. Also gets cough when she feels short of breath. Review of Systems Constitutional: Negative.  HENT: Negative.   Eyes: Negative.  Respiratory:  Positive for cough and shortness of breath.  Cardiovascular: Negative.  Gastrointestinal: Negative.  Genitourinary: Negative.  Musculoskeletal: Negative.  Skin: Negative.  Neurological: Negative.  Endo/Heme/Allergies: Negative.  Psychiatric/Behavioral: Negative.   All other systems reviewed and are negative.MEDICATIONS: Current Outpatient Medications:   BLOOD GLUCOSE METER, Dispense 1 Blood Glucose Meter Accucheck. Dx E11.9 Use to check BS daily., Disp: 1 each, Rfl: 0  glucose blood, Testing Frequency: Three times daily DX E11.9. Accucheck test strips., Disp: 100 each, Rfl: 3  diclofenac, Apply topically 4 (four) times daily., Disp: 100 g, Rfl: 2  lancets, USE AS DIRECTED TO CHECK BLOOD SUGAR UP TO 3 TIMES DAILY E11.9, Disp: 100 each, Rfl: 3  lidocaine, Place 1 patch over 12 hours onto the skin every 24 hours. Remove & Discard patch within 12 hours or as directed by MD, Disp: 30 patch, Rfl: 1  losartan, Take 1 tablet (25 mg total) by mouth daily., Disp: 90 tablet, Rfl: 3  rosuvastatin, Take 1 tablet (10 mg total) by mouth nightly., Disp: 90 tablet, Rfl: 3  triamcinolone, Apply topically 2 (two) times daily., Disp: 30 g, Rfl: 1ALLERGIES:Allergies Allergen Reactions  Penicillins Hives   Hives Has patient had a PCN reaction causing immediate rash, facial/tongue/throat swelling, SOB or lightheadedness with hypotension: YESHas patient had a PCN reaction causing severe rash involving mucus membranes or skin necrosis: NOHas patient had a PCN reaction that required hospitalization NOHas patient had a PCN reaction occurring within the last 10 years: NOIf all of the above answers are NO, then may proceed with Cephalosporin use.  Sulfa (Sulfonamide Antibiotics) Hives and Rash Sulfamethoxazole-Trimethoprim Hives  Sulfasalazine Hives  rashrash  Moxifloxacin Rash PAST MEDICAL HISTORY:Past Medical History: Diagnosis Date  Chronic kidney disease   PKD, but normal creatinine  Eosinophilic esophagitis 08/26/2020  GERD (gastroesophageal reflux disease)   History of torn meniscus of right knee 02/2022  Hypertension   Mitral valve regurgitation   Palpitations   Polycystic kidney disease   Sickle cell trait (HC Code) (HC CODE) (HC Code)   Natera testing 2021  Vitamin D deficiency  Past Surgical History: Procedure Laterality Date  APPENDECTOMY  1984  COLONOSCOPY    DILATION AND CURETTAGE OF UTERUS    HYSTERECTOMY  08/30/2017  LAPAROSCOPY    LEEP    TONSILLECTOMY    TUBAL LIGATION    UPPER GASTROINTESTINAL ENDOSCOPY   PHYSICAL EXAMINATION:Vital signs:  LMP 08/30/2017 (Exact Date) Comment: hyst 08/30/2017 Weight   lbs. on RAPhysical ExamConstitutional:     Appearance: Normal appearance. HENT:    Head: Normocephalic.    Mouth/Throat:    Mouth: Mucous membranes are moist.    Pharynx: Oropharynx is clear. No oropharyngeal exudate or posterior oropharyngeal erythema. Cardiovascular:    Rate and Rhythm: Normal rate and regular rhythm.    Pulses: Normal pulses.    Heart sounds: No murmur heard.Pulmonary:    Effort: Pulmonary effort is normal. No respiratory distress.    Breath sounds: No stridor. No wheezing. Musculoskeletal:       General: No swelling, deformity or signs of injury. Skin:   General: Skin is warm and dry.    Capillary Refill: Capillary refill takes less than 2 seconds.    Findings: No erythema. Neurological:    General: No focal deficit present.    Mental Status: She is alert and oriented to person, place, and time. Psychiatric:       Mood and Affect: Mood normal.       Behavior: Behavior normal. REVIEW OF DATA:Pulmonary Function Testing: Date FVC FEV1 FEV1/FVC TLC DLCO  01/21/2022 2.46 (73%) 1.96 (73%) 80% 3.44 (66%) 15.95 (78%) 02/04/23 2.02 (65) 1.62 (64) 80% 3.35 (64) 16.33 (81) 06/09/23 2.17 (71) 1.62 (66) 75% 2.85 (56) 17.06 (85)  Personal Interpretation:  No obstruction, moderate restriction, normal DLCO. FVC and DLCO are improvedChest Radiology: 10/18/21 [facility] ChestLungs/Airways/Pleura: Central tracheobronchial tree is patent. Minimal tree-in-bud and groundglass opacities in the right lower lobe, likely infectious or inflammatory etiology (4:367). No reticulations, bronchiectasis, honeycombing, architectural distortion or other evidence of interstitial lung disease. No suspicious pulmonary nodules or masses. No pleural effusion or pneumothorax.Mediastinum/Lymph nodes: Left subclavicular, superior mediastinal, right paratracheal, prevascular, bilateral hilar and subcarinal adenopathy. Largest nodes measure up to 14 mm in the subcarinal region.[place] [facility] 4/22/24Slight increase in the size of mediastinal adenopathy compared to study from June 2023. No change in lung findings including a few perilymphatic nodules. No significant bronchiectasis or architectural distortion in the lungs.  HRCT - 07/03/23 - Worsening pleuropulmonary findings related to known sarcoidosis with no true parenchymal progression. Unchanged thoracic adenopathy. Other Radiology/Studies:[place] [facility] 6/23/23The liver, gallbladder, spleen, pancreas, and adrenal glands are unremarkable, except for tiny hepatic cysts. Again seen are multiple bilateral renal cysts and hypodensities too small to characterize, including a 2.9 cm lesion in the right kidney that likely represents a cyst with new hemorrhagic/proteinaceous debris (image 39, series 2) that has increased from 2.0 cm. There is no bowel obstruction, ascites, or lymphadenopathy. No aggressive osseous lesions are seen.  Pathology:7/11/23Lung, right mainstem, endobronchial biopsy:       - Superficial fragments of bronchial mucosa with no significant abnormality.  FNA lung1.  RIGHT MIDDLE  LOBE BRONCHOALVEOLAR LAVAGE:      - NEGATIVE FOR MALIGNANT CELLS.      - REACTIVE PULMONARY MACROPHAGES.      - GMS STAIN IS NEGATIVE FOR FUNGUS/PJP.      - AFB STAIN IS NEGATIVE FOR MYCOBACTERIA. 2.  LYMPH NODE, 4R, FINE NEEDLE ASPIRATION/WASH:      - NEGATIVE FOR MALIGNANT CELLS.           - REACTIVE LYMPHOCYTES AND FOCAL LYMPHOHISTIOCYTIC AGGREGATES. 3.  LYMPH NODE, 10R, FINE NEEDLE ASPIRATION/WASH:      - NEGATIVE FOR MALIGNANT CELLS.           - REACTIVE LYMPHOCYTES AND FOCAL LYMPHOHISTIOCYTIC AGGREGATES. 4.  LYMPH NODE, 4L, FINE NEEDLE ASPIRATION/WASH:      - NEGATIVE FOR MALIGNANT CELLS.      - FOCAL NON-CASEATING GRANULOMATOUS INFLAMMATION.      - GMS STAIN IS NEGATIVE FOR FUNGUS/PJP.      - AFB STAIN IS NEGATIVE FOR MYCOBACTERIA.  Cardiac studies:07/11/20 * Normal left ventricular size, wall thickness, systolic function and wall motion. LVEF calculated by 3DE was 69%.  Diastolic function was difficult to determine due to mitral valve abnormalities.* Normal right ventricular cavity size and systolic function.  Right ventricular systolic pressure is unable to be estimated due to insufficient Doppler signal.* Atria are normal in size.  No interatrial shunt by color Doppler.  Interatrial septum is bowed toward the left, consistent with elevated right atrial pressure.* Normal mitral valve leaflets.  Moderate posteriorly directed mitral regurgitation. By PISA, EROA PISA is 0.24 cm2 and regurgitant volume 46 ml.* All visible segments of the aorta are normal in size.* IVC diameter < 2.1 cm that collapses > 50% with a sniff suggests normal RAP (0-5 mmHg, mean 3 mmHg). Echo 03/24/23 * Normal left ventricular size, systolic function and wall motion. Mild concentric left ventricular hypertrophy.  LVEF calculated by 3DE was 63%.  Normal global longitudinal strain (-21%).  Normal diastolic function and filling pressures.* Normal right ventricular cavity size and systolic function.  Estimated right ventricular systolic pressure is 26 mmHg.* Mild mitral regurgitation.* Mild tricuspid regurgitation.* Normal sinuses of Valsalva with a diameter of 2.9 cm and normal ascending aorta with a diameter of 3.0 cm.* IVC diameter < 2.1 cm that collapses > 50% with a sniff suggests normal RAP (0-5 mmHg, mean 3 mmHg).Labs:10/2021:CRP 5.1, ESR 34Neg ACE, RFNormal complementIgG: 1625, remainder unremarkableANA > 1:2560 (nuclear dots) 1:160 speckled. DsDNA, RNP, SSA, SSB, Smith, all negative 12/2019ANA     1:1280 nuclear, multiple nuclear dotsdsDNA Negative Scl-70  Negative SSA     NegativeSSB     NegativeJo-1     NegativeCCP     <16RF IgM            10ANCA  NegativeSm Ab NegativeMito Ab            Negative C3        161C4        42IMPRESSION:Cathy Evans is a 58 y.o. 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 On [place] scan she has no evidence of parenchymal involvement from sarcoidosis.  She is evidence of restriction on pulmonary function tests and I believe these could be due to neuromuscular weakness given that her parenchymal findings are quite minimal.  RECOMMENDATIONS:SarcoidosisPathology: EBUS with FNA showing non caseating granulomasOrgan involvement: LADsPrimary target organ  for therapy: none currentlyCurrent treatment: nonePast treatments: noneFurther work-up:Given increased nodularity on [place] scan and worsening symptoms we discussed the role of treatment today. Start prednisone 30mg  QD with atovaquone for PJP prophy (has sulfa allergy). I will call her in 3 weeks, if no improvement then would need to look for alternative causes for dyspnea and I will wean her pred down. If improvement, then will switch to MTX for long acting treatment. Annual vitamin D levels. Continue  daily sinus rinses followed by flonase Please do not hesitate to call me if you would like to discuss Ms. Leckrone's care.Sincerely,Ephrem Carrick Ginnie Smart, MD, MScAssistant ProfessorYale Interstitial Lung Disease CenterSection of Pulmonary and Critical Care MedicineYale School of MedicineOn the day of this patient's encounter, a total of 40 minutes was personally spent by me, which includes time spent on chart and records review, review of radiological images, medical consultation, education, coordination of care/services , counseling and chart documentation.This does not include any resident/fellow teaching time, or any time spent performing a procedural service.Scribed for Cerro-Chiang, Feliciana Rossetti, MD by Minta Balsam, medical scribe March 5, 2025The documentation recorded by the scribe accurately reflects the services I personally performed and the decisions made by me. I reviewed and confirmed all material entered and/or pre-charted by the scribe.

## 2023-07-28 ENCOUNTER — Ambulatory Visit: Admit: 2023-07-28 | Payer: PRIVATE HEALTH INSURANCE | Primary: Internal Medicine

## 2023-07-28 ENCOUNTER — Emergency Department: Admit: 2023-07-28 | Payer: PRIVATE HEALTH INSURANCE | Primary: Internal Medicine

## 2023-07-28 ENCOUNTER — Inpatient Hospital Stay: Admit: 2023-07-28 | Discharge: 2023-07-29 | Payer: PRIVATE HEALTH INSURANCE | Primary: Internal Medicine

## 2023-07-28 LAB — BASIC METABOLIC PANEL
BKR ANION GAP: 15 (ref 7–17)
BKR BLOOD UREA NITROGEN: 6 mg/dL (ref 6–20)
BKR BUN / CREAT RATIO: 11.1 (ref 8.0–23.0)
BKR CALCIUM: 9.7 mg/dL (ref 8.8–10.2)
BKR CHLORIDE: 104 mmol/L (ref 98–107)
BKR CO2: 21 mmol/L (ref 20–30)
BKR CREATININE DELTA: -0.12
BKR CREATININE: 0.54 mg/dL (ref 0.40–1.30)
BKR EGFR, CREATININE (CKD-EPI 2021): >60 mL/min/{1.73_m2} (ref >=60)
BKR GLUCOSE: 90 mg/dL (ref 70–100)
BKR SODIUM: 140 mmol/L (ref 136–144)

## 2023-07-28 LAB — PROTIME AND INR
BKR INR: 0.98 (ref 0.86–1.13)
BKR PROTHROMBIN TIME: 10.8 s (ref 9.6–12.3)

## 2023-07-28 LAB — CBC WITH AUTO DIFFERENTIAL
BKR WAM ABSOLUTE IMMATURE GRANULOCYTES.: 0.02 x 1000/ÂµL (ref 0.00–0.30)
BKR WAM ABSOLUTE LYMPHOCYTE COUNT.: 1.95 x 1000/ÂµL (ref 0.60–3.70)
BKR WAM ABSOLUTE NRBC (2 DEC): 0 x 1000/ÂµL (ref 0.00–1.00)
BKR WAM ANC (ABSOLUTE NEUTROPHIL COUNT): 2.88 x 1000/ÂµL (ref 2.00–7.60)
BKR WAM BASOPHIL ABSOLUTE COUNT.: 0.05 x 1000/ÂµL (ref 0.00–1.00)
BKR WAM BASOPHILS: 0.8 % (ref 0.0–1.4)
BKR WAM EOSINOPHIL ABSOLUTE COUNT.: 0.95 x 1000/ÂµL (ref 0.00–1.00)
BKR WAM EOSINOPHILS: 14.9 % — ABNORMAL HIGH (ref 0.0–5.0)
BKR WAM HEMATOCRIT (2 DEC): 39.6 % (ref 35.00–45.00)
BKR WAM HEMOGLOBIN: 13.7 g/dL (ref 11.7–15.5)
BKR WAM IMMATURE GRANULOCYTES: 0.3 % (ref 0.0–1.0)
BKR WAM LYMPHOCYTES: 30.6 % (ref 17.0–50.0)
BKR WAM MCH (PG): 29 pg (ref 27.0–33.0)
BKR WAM MCHC: 34.6 g/dL (ref 31.0–36.0)
BKR WAM MCV: 83.9 fL (ref 80.0–100.0)
BKR WAM MONOCYTE ABSOLUTE COUNT.: 0.53 x 1000/ÂµL (ref 0.00–1.00)
BKR WAM MONOCYTES: 8.3 % (ref 4.0–12.0)
BKR WAM MPV: 9.5 fL (ref 8.0–12.0)
BKR WAM NEUTROPHILS: 45.1 % (ref 39.0–72.0)
BKR WAM NUCLEATED RED BLOOD CELLS: 0 % (ref 0.0–1.0)
BKR WAM PLATELETS: 375 x1000/ÂµL (ref 150–420)
BKR WAM RDW-CV: 13.5 % (ref 11.0–15.0)
BKR WAM RED BLOOD CELL COUNT.: 4.72 M/ÂµL (ref 4.00–6.00)
BKR WAM WHITE BLOOD CELL COUNT: 6.4 x1000/ÂµL (ref 4.0–11.0)

## 2023-07-28 LAB — POTASSIUM: BKR POTASSIUM: 3.8 mmol/L (ref 3.3–5.3)

## 2023-07-28 LAB — PARTIAL THROMBOPLASTIN TIME     (BH GH LMW Q YH): BKR PARTIAL THROMBOPLASTIN TIME: 26.7 s (ref 23.0–31.0)

## 2023-07-28 MED ORDER — IOHEXOL 350 MG IODINE/ML INTRAVENOUS SOLUTION
350 | Freq: Once | INTRAVENOUS | Status: CP | PRN
Start: 2023-07-28 — End: ?
  Administered 2023-07-28: 18:00:00 350 mL via INTRAVENOUS

## 2023-07-28 MED ORDER — LOSARTAN 25 MG TABLET
25 | Freq: Every day | ORAL | Status: DC
Start: 2023-07-28 — End: 2023-07-29

## 2023-07-28 MED ORDER — SODIUM CHLORIDE 0.9 % LARGE VOLUME SYRINGE FOR AUTOINJECTOR
Freq: Once | INTRAVENOUS | Status: CP | PRN
Start: 2023-07-28 — End: ?
  Administered 2023-07-28: 18:00:00 via INTRAVENOUS

## 2023-07-28 MED ORDER — ROSUVASTATIN 10 MG TABLET
10 | Freq: Every evening | ORAL | Status: DC
Start: 2023-07-28 — End: 2023-07-29

## 2023-07-28 MED ORDER — ATOVAQUONE 750 MG/5 ML ORAL SUSPENSION
7505 | Freq: Two times a day (BID) | ORAL | Status: DC
Start: 2023-07-28 — End: 2023-07-29

## 2023-07-28 NOTE — Consults
 Neurology Consult NoteReason for consult: 1 week of dizziness, blurry vision, R sided weaknessHistory of Present Cathy Evans is a 58 y.o. year old F with PMH significant for HTN, HLD, diabetes, sickle cell trait, polycystic kidney disease, mitral valve regurgitation, pulmonary sarcoidosis. She presented to the ED with 1 week of dizziness and blurry vision in her R eye. Neurology was consulted in the ED for this.Patient says she is here in the ED due to blurry vision in her R eye. She incidentally reports dizziness but says this is not the reason why she came to the ED. Patient say she started having dizziness approximately 1 week ago. She describes the dizziness more as lightheaded. It comes when standing up and does not involve room spinning sensation although it does feel like she is on a boat. She has been intermittently nauseous but has not vomited. She is not sure if she has ever had similar symptoms to this before.She felt she had some blurry vision intermittantly but it worsened last night around midnight. It got better this morning briefly but has not entirely resolved so she presented to the ED today for further evaluation. In the ED evaluation by provider she was noted to have some possible R arm and leg weakness although patient did not notice this subjectivelely herself so cannot say when these symptoms started. Medical historyPast Medical History: Diagnosis Date  Chronic kidney disease   PKD, but normal creatinine  Eosinophilic esophagitis 08/26/2020  GERD (gastroesophageal reflux disease)   History of torn meniscus of right knee 02/2022  Hypertension   Mitral valve regurgitation   Palpitations   Polycystic kidney disease   Sickle cell trait (HC Code) (HC CODE) (HC Code)   Natera testing 2021  Vitamin D deficiency    Outpatient MedicationsCurrent Outpatient Medications Medication Instructions  atovaquone (MEPRON) 750 mg, Oral, EVERY 12 HOURS  BLOOD GLUCOSE METER device Dispense 1 Blood Glucose Meter Accucheck. Dx E11.9 Use to check BS daily.  blood sugar diagnostic (GLUCOSE BLOOD) test strips Testing Frequency: Three times daily DX E11.9. Accucheck test strips.  diclofenac (VOLTAREN) 1 % gel Topical (Top), 4 Times Daily Scheduled  lancets (ACCU-CHEK SOFTCLIX LANCETS) USE AS DIRECTED TO CHECK BLOOD SUGAR UP TO 3 TIMES DAILY E11.9  lidocaine (ASPERCREME, LIDOCAINE,) 4 % topical patch 1 patch, Transdermal, EVERY 24 HOURS, Remove & Discard patch within 12 hours or as directed by MD  losartan (COZAAR) 25 mg, Oral, Daily  predniSONE (DELTASONE) 30 mg, Oral, Daily, Take with food.  rosuvastatin (CRESTOR) 10 mg, Oral, Nightly  triamcinolone (KENALOG) 0.1 % ointment Topical (Top), 2 Times Daily Scheduled  Surgical historyPast Surgical History:1984: AppendectomyNo date: ColonoscopyNo date: Dilation and curettage of uterus04/23/2019: HysterectomyNo date: LaparoscopyNo date: LeepNo date: TonsillectomyNo date: Tubal ligationNo date: Upper gastrointestinal endoscopy  AllergiesAllergies Allergen Reactions  Penicillins Hives   Hives Has patient had a PCN reaction causing immediate rash, facial/tongue/throat swelling, SOB or lightheadedness with hypotension: YESHas patient had a PCN reaction causing severe rash involving mucus membranes or skin necrosis: NOHas patient had a PCN reaction that required hospitalization NOHas patient had a PCN reaction occurring within the last 10 years: NOIf all of the above answers are NO, then may proceed with Cephalosporin use.  Sulfa (Sulfonamide Antibiotics) Hives and Rash  Sulfamethoxazole-Trimethoprim Hives  Sulfasalazine Hives   rashrash  Moxifloxacin Rash  Relevant social historySocial History Tobacco Use  Smoking status: Never   Passive exposure: Yes  Smokeless tobacco: Never Vaping Use  Vaping status: Never Used Substance Use Topics  Alcohol use: Not Currently   Comment: social  Drug use: No Relevant family historyFamily History Problem Relation Age of Onset  High cholesterol Mother   Hypertension Mother   Stroke Mother   Diabetes Father   Kidney disease Father   Hyperthyroidism Sister   Diabetes Brother   Hypertension Brother   Diabetes Brother   Breast cancer Maternal Grandmother   Asthma Son   Ovarian cancer Maternal Aunt   Heart attack Paternal Uncle   BP (!) 159/88  - Pulse (!) 91  - Temp 97.6 ?F (36.4 ?C) (Temporal)  - Resp 18  - Wt 83.9 kg  - LMP 08/30/2017 (Exact Date) Comment: hyst 08/30/2017 - SpO2 100%  - BMI 32.77 kg/m? Neurological exam:General: No acute distress. No respiratory distress.Awake, alert, oriented to name, location, date, and situation. Naming and repetition intact. Follows two step commands.PERLA. EOM intact. No nystagmus. Visual fields full. Face symmetric. Tongue midline. No appreciable dysarthria.RUE: antigravity no driftLUE: antigravity no driftRLE: antigravity no drift, proximal strength 4/5 in the thigh but improves to 5/5 distally. Patient denies current weakness in this leg but reports significant pain in her lower back.LLE: antigravity no drift, strength 5/5 throughoutDenies sensory deficits to light touch.No dysmetria finger to noseNo hyperreflexia in biceps and patella. Toes mute bilaterally.No results for input(s): WBC, HGB, HCT, PLT in the last 168 hours. No results for input(s): NEUTROPHILS, LABLYMP, LABEOS, BANDSP in the last 168 hours. No results for input(s): NA, K, CL, CO2, BUN, CREATININE, GLU, ANIONGAP in the last 168 hours. No results for input(s): CALCIUM, MG, PHOS in the last 168 hours. No results for input(s): ALT, AST, ALKPHOS, BILITOT, BILIDIR, PROT, ALBUMIN in the last 168 hours. No results for input(s): PTT, LABPROT, INR in the last 168 hours. Stroke Risk Factor Testing:Test Most recent values Hemoglobin A1c Lab Results Component Value Date  HGBA1C 6.6 (H) 05/14/2023  Cholesterol and Lipids Lab Results Component Value Date  CHOL 200 (H) 05/21/2023  TRIG 75 05/21/2023  HDL 48 05/21/2023  LDL 138 (H) 05/21/2023  LDL 116 (H) 03/13/2018  Thyroid Function Lab Results Component Value Date  TSH 3.420 02/08/2023  Inflammatory Markers Lab Results Component Value Date  SEDRATE 79 (H) 05/21/2023 Lab Results Component Value Date  HSCRP 10.4 (H) 05/21/2023 Lab Results Component Value Date  ANA Positive (A) 10/27/2021  Coagulation Factors Lab Results Component Value Date  INR 0.96 12/11/2018  PTT 25.2 12/11/2018  Systemic Lab Results Component Value Date  DDIMER 2.53 (H) 04/25/2017   Microbiology:Urine Culture, Routine Date Value Ref Range Status 05/28/2023 >=100,000 CFU/mL Klebsiella pneumoniae (A)  Final 10/25/2022 No Growth  Final 05/05/2020   Final  Less than 10,000 CFU/mL. Clinical significance is unlikely for organism(s) present in quantities of less than 10,000 CFU/mL. Lower Respiratory Culture Date Value Ref Range Status 11/17/2021 100-900 cfu/mL Normal Flora  Final Radiology: No results found.Cardiac:Results for orders placed or performed during the hospital encounter of 03/24/23 Echo 2D Complete w Doppler and CFI if Ind Image Enhancement 3D and or bubbles Result Value Ref Range  Reported 3DE EF% 63 %  Narrative   * Normal left ventricular size, systolic function and wall motion. Mild concentric left ventricular hypertrophy.  LVEF calculated by 3DE was 63%.  Normal global longitudinal strain (-21%).  Normal diastolic function and filling pressures.* Normal right ventricular cavity size and systolic function.  Estimated right ventricular systolic pressure is 26 mmHg.* Mild mitral regurgitation.* Mild tricuspid regurgitation.* Normal sinuses of Valsalva with a diameter of 2.9 cm and  normal ascending aorta with a diameter of 3.0 cm.* IVC diameter < 2.1 cm that collapses > 50% with a sniff suggests normal RAP (0-5 mmHg, mean 3 mmHg).* No significant pericardial effusion.* Compared with the prior study, dated 03/04/2022, there is no significant change. Neurological imaging/studies:MRA Brain wo IV Contrast (07/30/2017 4:00 PM) No aneurysm or significant stenosis identified within the visualized vessels. Fenestration of the basilar artery. Additional laboratory, microbiology, radiology data reviewed and notably discussed below. AssessmentMerashe Evans is a 58 y.o. year old F with PMH significant for HTN, HLD, diabetes, sickle cell trait, polycystic kidney disease, mitral valve regurgitation, pulmonary sarcoidosis. She presented to the ED with 1 week of dizziness and blurry vision in her R eye. Neurology was consulted in the ED for this.Patient's blurry vision has improved. Her vision is 20/20 currently and she does not have any field deficits on my bedside exam although formal VF testing not performed. Ophthalmology evaluated her and believed her symptoms related to dry eyes. Recommended artifical tears. Consult Note by Mack Hook, MD (07/28/2023 4:52 PM) We will defer to ophthalmology regarding her blurry eyes and need for further test regarding this.For her dizziness, she describes it more as lightheadedness when ambulating or standing up. It does not have room spinning sensation. She does not have other posterior brainstem symptoms such as diplopia, dysphagia, dysarthria. HINTS exam is not indicated in her case since she does not have the symptoms currently when lying down.Patient currently denies weakness on her R side although objectively her proximal hip flexor is weaker than her L, distally the strength is 5/5. She reports significant pain her lower back which may be confounding her strength exam. Could consider PT evaluation of her ambulating, however reasonable to defer this is patient feels comfortable ambulating given that she does not feel symptomatically weak on her R sideWe recommend to obtain MRI brain with and without contrast to further evaluate central cause of her symptoms since she has significant stroke risk factors, and stroke or other central neurological process would be a unifying diagnosis for her symptoms of dizziness and R sided weakness. If her MRI brain is unremarkable, no further inpatient neurological work-up would be indicated and she could follow-up with neurology as an outpatient.Recommendations- CTH, CTA H/N- Ophthalmology evaluation for her blurry vision, defer to them for management of this- MRI brain with and without contrast contrast in the setting of her sarcoidosis to rule out central cause of dizziness and weakness- Please check orthostatic vitals to evaluate for her light headedness- Consider PT evaluation of patient for safety with regards to ambulation, however reasonable to defer this is patient feels comfortable ambulating given that she does not feel symptomatically weak on her R side- If MRI is unremarkable, no further inpatient neurological work-up / treatment indicated and patient can be referred to neurology clinic for follow-upSeen and staffed with neurology attending Dr. Selena Batten.Signed:Jersee Winiarski Despina Hick, MD (Neurology, PGY-3)Incomplete until approved by senior resident/attending. Additions/modifications may followAvailable by MHB3/20/2025

## 2023-07-28 NOTE — ED Notes
 7:28 PM Report received from off going nurse. Patient care assumed at this time. 58 y/o female presents to ED c/o R eye blurry vision. MD found slight R sided weaknesses upon eval. No stroke code. Seen by optho:Recommendations- over-the-counter artificial tears QID, preservative free- over-the-counter nighttime ophthalmic ointment nightly- education providedSeen by neuro:Recommendations- CTH, CTA H/N- Ophthalmology evaluation for her blurry vision, defer to them for management - MRI brain WITH AND WITHOUT contrast in the setting of her sarcoidosis to rule out central cause of dizziness and weakness- Please check orthostatic vitals to evaluate for her light headedness- Consider PT evaluation of patient for safety with regards to ambulation.Pending MRI.7:51 PM MRI screening complete. Orthostatic BP documented.

## 2023-07-29 DIAGNOSIS — Z79899 Other long term (current) drug therapy: Secondary | ICD-10-CM

## 2023-07-29 DIAGNOSIS — H538 Other visual disturbances: Secondary | ICD-10-CM

## 2023-07-29 DIAGNOSIS — R531 Weakness: Secondary | ICD-10-CM

## 2023-07-29 DIAGNOSIS — H04123 Dry eye syndrome of bilateral lacrimal glands: Secondary | ICD-10-CM

## 2023-07-29 DIAGNOSIS — Z881 Allergy status to other antibiotic agents status: Secondary | ICD-10-CM

## 2023-07-29 DIAGNOSIS — Z880 Allergy status to penicillin: Secondary | ICD-10-CM

## 2023-07-29 DIAGNOSIS — H259 Unspecified age-related cataract: Secondary | ICD-10-CM

## 2023-07-29 DIAGNOSIS — R42 Dizziness and giddiness: Secondary | ICD-10-CM

## 2023-07-29 DIAGNOSIS — Z882 Allergy status to sulfonamides status: Secondary | ICD-10-CM

## 2023-07-29 MED ORDER — GADOTERATE MEGLUMINE 0.5 MMOL/ML (376.9 MG/ML) INTRAVENOUS SOLUTION
0.5376.9 | Freq: Once | INTRAVENOUS | Status: CP | PRN
Start: 2023-07-29 — End: ?
  Administered 2023-07-29: 0.5376.9 mL via INTRAVENOUS

## 2023-07-29 NOTE — ED Notes
 9:42 PM Received report from previous RN. Pt presented to ED for R eye blurry vision. Chief Complaint Patient presents with  Eye Problem Pt seen by optho and neuro, pending MRI at this time. 11:00 PM Pt refusing meds at this time, APRN Bernardi notified. 11:25 PM Pt to MRI via pt transport. 12:40 AM Pt ambulatory to restroom, slow steady gait noted. Pt denies any complaints at this time. Pt pending imaging results. 4:10 AM Pt resting in stretcher, equal chest rise and fall noted on assessment. Pt pending plan of care. 4:54 AM Provider at bedside. Pt pending disposition. 5:07 AM VS stable. PIV removed. Discharge paperwork provided, no questions at this time. Pt ambulatory upon discharge, steady gait noted upon exit.

## 2023-07-29 NOTE — Other
 [facility] HospitalOp[facility] Initial Consult NoteConsult Information: Date of Admission: 07/28/2023 Date of Service: July 28, 2023 Consult Requested By: Adult EDHawk, Lennox Grumbles, MD Reason for Consult: Right eye blurry visionThe patient was not personally examined. The chart was reviewed and discussed with the resident who evaluated the patient.  Findings and recommendations as outlined by the resident.   I agree with the findings and recommendations outlined below with the following additions:Transient blurry vision resolved at time of exam, back to 20/20 both eyes. With significant dryness on exam. Agree with plan and follow up below.Hollice Espy, MD3/21/20259:57 AM Presentation History: Cathy Evans is a 58 y.o.F with a history of  HTN, CKD, PCKD, mitral regurgitation, pulmonary sarcoidosis   who presented on July 28, 2023 with right eye blurry vision. Patient woke up at 1am this morning with right eye painless blurry vision with some clear tearing. Patient went back to sleep and woke up this morning with significant improvement of her blurred vision. At time of ophthalmologic exam blurred vision had almost completely resolved. Patient got a CTA HN in the ED which was unremarkable. This was done due to concern of possible right arm and leg weakness noted by ED providers (patient did not subjectively note herself). Endorses: blurry vision and tearingDenies: eye pain, diplopia, flashes, floaters, redness, discharge, and photosensitivityOphthalmologic History: Eye care provider:    (optometry)Last eye exam:  6 months agoPast ocular history: as per HPIPast medical history: as aboveOcular meds: Reviewed. noneMedications: Reviewed.  Family history: non-contributoryAllergies: Allergies Allergen Reactions  Penicillins Hives   Hives Has patient had a PCN reaction causing immediate rash, facial/tongue/throat swelling, SOB or lightheadedness with hypotension: YESHas patient had a PCN reaction causing severe rash involving mucus membranes or skin necrosis: NOHas patient had a PCN reaction that required hospitalization NOHas patient had a PCN reaction occurring within the last 10 years: NOIf all of the above answers are NO, then may proceed with Cephalosporin use.  Sulfa (Sulfonamide Antibiotics) Hives and Rash  Sulfamethoxazole-Trimethoprim Hives  Sulfasalazine Hives   rashrash  Moxifloxacin Rash  Objective Ophthalmologic Exam: Vitals:  07/28/23 1441 BP: (!) 159/88 Pulse: (!) 91 Resp: 18 Temp: 97.6 ?F (36.4 ?C) General / External Exam Comfortable and in no acute distress. Appears appropriate for age externally. Vision / Pressure / Pupils Right Left Vision: Near CC 20/20 20/20 IOP (mmHg) (tonopen) 16 17 Pupils 4->2, brisk, no RAPD 4->2, brisk Functional Exam Right Left Motility Full Full Confrontational Visual Fields Grossly Full Grossly Full Color Plates Harlan Stains) 11/11 11/11 Anterior Exam via Slit Lamp Right Left Lids and Lashes Smooth and well positioned Smooth and well positioned Conjunctiva and Sclera White and quiet White and quiet Cornea 3+ PEEs inferiorly 1+ PEEs Anterior chamber Deep and quiet Deep and quiet Iris Flat Flat [x] Both eyes [] Right eye [] Left eye dilated with Phenylephrine 2.5% and Tropicamide 1% at 4:53 PMDilated Fundus Exam (DFE) Right Left Lens phakic with NS and ACC phakic with NS and ACC Vitreous Clear Clear Disc 0.5 cup to disc, sharp, pink and flat 0.5 cup to disc, sharp, pink and flat Macula Flat Flat Vessels Normal caliber and distribution Normal caliber and distribution Periphery Peripheral drusen, flat; no tears, detachments, whitening, commotio Peripheral drusen, flat; no tears, detachments, whitening, commotio Review of Labs/Diagnostics: Lab Review:No results found for requested labs within last 7 days. Results in Past 7 DaysResult Component Current Result Glucose, Meter 88 (07/28/2023) Diagnostic Review:No results found. Impression/Recommendations Dry eyes, both eyes (OD>OS)- 3+ PEEs OD, 1+ PEEs OS-  symptomatic due to transient episode of blurry vision with tearing- DFE unremarkable with sharp, pink, flat optic nerves, no evidence of retinal tear or hemorrhage  - VA 20/20 both eyes, no RAPD, no color plate deficit and pressures normal, no eye pain or pressure.Age related cataracts, both eyes- patient not bothered- monitor outpatientRecommendations- over-the-counter artificial tears QID, preservative free- over-the-counter nighttime ophthalmic ointment nightly- education providedFollow-up:Patient will follow up with their own ophthalmologist/optometrist as discussed. Return precautions discussedThank you for the opportunity to participate in the care of this patient. All recommendations are preliminary until cosigned by the consult attending.Please do not hesitate to page ophthalmology with any questions or concerns via OneCall/Qgenda. Please be aware of consult resident may switch at 5pm and 7am on weekdays.Signed:Geoffrey Rodman Comp, MDElectronically Signed by Marin Shutter, MDPGY-2, OphthalmologyMarch 20, 20254:53 PM

## 2023-08-02 ENCOUNTER — Encounter: Admit: 2023-08-02 | Payer: PRIVATE HEALTH INSURANCE | Attending: Rheumatology | Primary: Internal Medicine

## 2023-08-03 ENCOUNTER — Telehealth
Admit: 2023-08-03 | Payer: PRIVATE HEALTH INSURANCE | Attending: Student in an Organized Health Care Education/Training Program | Primary: Internal Medicine

## 2023-08-03 NOTE — Telephone Encounter
 TELEPHONE VISIT: For this visit the clinician and patient were present via telephone (audio only).Patient counseled on available options for visit type; Patient elected telephone visit (audio only); Patient consent given for telephone (audio only) visit : YesState patient is located in: CTThe clinician is appropriately licensed in the above state to provide care for this visitPatient Identity was confirmed during this call.  Other individuals actively participating in the telephone encounter and their name/relation to the patient: noneGreater than 10 minutes of time was spent with this patient in medical discussion to support the telephone (audio only) visit: NoBecause this visit was completed over telephone, a hands-on physical exam was not performed.  Patient understands and knows to call back if condition changes.Telephone visitCalled Cathy Evans to see how she was feeling after starting prednisone. She took Prednisone 30mg  for about 5 days, stopped because she did not want to gain weight. She has been feeling okay from the respiratory standpoint for the past week. Given improvement on respiratory symptoms off prednisone, decided to not pursue additional treatment.

## 2023-08-10 ENCOUNTER — Inpatient Hospital Stay: Admit: 2023-08-10 | Discharge: 2023-08-10 | Primary: Internal Medicine

## 2023-08-10 ENCOUNTER — Encounter
Admit: 2023-08-10 | Payer: PRIVATE HEALTH INSURANCE | Attending: Student in an Organized Health Care Education/Training Program | Primary: Internal Medicine

## 2023-08-10 DIAGNOSIS — D869 Sarcoidosis, unspecified: Secondary | ICD-10-CM

## 2023-08-10 LAB — CBC WITH AUTO DIFFERENTIAL
BKR WAM ABSOLUTE IMMATURE GRANULOCYTES.: 0.01 x 1000/ÂµL (ref 0.00–0.30)
BKR WAM ABSOLUTE LYMPHOCYTE COUNT.: 1.62 x 1000/ÂµL (ref 0.60–3.70)
BKR WAM ABSOLUTE NRBC (2 DEC): 0 x 1000/ÂµL (ref 0.00–1.00)
BKR WAM ANC (ABSOLUTE NEUTROPHIL COUNT): 2.47 x 1000/ÂµL (ref 2.00–7.60)
BKR WAM BASOPHIL ABSOLUTE COUNT.: 0.06 x 1000/ÂµL (ref 0.00–1.00)
BKR WAM BASOPHILS: 1.1 % (ref 0.0–1.4)
BKR WAM EOSINOPHIL ABSOLUTE COUNT.: 0.8 x 1000/ÂµL (ref 0.00–1.00)
BKR WAM EOSINOPHILS: 14.7 % — ABNORMAL HIGH (ref 0.0–5.0)
BKR WAM HEMATOCRIT (2 DEC): 39 % (ref 35.00–45.00)
BKR WAM HEMOGLOBIN: 12.7 g/dL (ref 11.7–15.5)
BKR WAM IMMATURE GRANULOCYTES: 0.2 % (ref 0.0–1.0)
BKR WAM LYMPHOCYTES: 29.7 % (ref 17.0–50.0)
BKR WAM MCH (PG): 28 pg (ref 27.0–33.0)
BKR WAM MCHC: 32.6 g/dL (ref 31.0–36.0)
BKR WAM MCV: 86.1 fL (ref 80.0–100.0)
BKR WAM MONOCYTE ABSOLUTE COUNT.: 0.5 x 1000/ÂµL (ref 0.00–1.00)
BKR WAM MONOCYTES: 9.2 % (ref 4.0–12.0)
BKR WAM MPV: 9.6 fL (ref 8.0–12.0)
BKR WAM NEUTROPHILS: 45.1 % (ref 39.0–72.0)
BKR WAM NUCLEATED RED BLOOD CELLS: 0 % (ref 0.0–1.0)
BKR WAM PLATELETS: 279 x1000/ÂµL (ref 150–420)
BKR WAM RDW-CV: 14.2 % (ref 11.0–15.0)
BKR WAM RED BLOOD CELL COUNT.: 4.53 M/ÂµL (ref 4.00–6.00)
BKR WAM WHITE BLOOD CELL COUNT: 5.5 x1000/ÂµL (ref 4.0–11.0)

## 2023-08-10 LAB — COMPREHENSIVE METABOLIC PANEL
BKR A/G RATIO: 1.2 (ref 1.0–2.2)
BKR ALANINE AMINOTRANSFERASE (ALT): 48 U/L — ABNORMAL HIGH (ref 10–35)
BKR ALBUMIN: 4.1 g/dL (ref 3.6–5.1)
BKR ALKALINE PHOSPHATASE: 153 U/L — ABNORMAL HIGH (ref 9–122)
BKR ANION GAP: 10 (ref 7–17)
BKR ASPARTATE AMINOTRANSFERASE (AST): 47 U/L — ABNORMAL HIGH (ref 10–35)
BKR AST/ALT RATIO: 1
BKR BILIRUBIN TOTAL: 0.4 mg/dL (ref <=1.2)
BKR BLOOD UREA NITROGEN: 9 mg/dL (ref 6–20)
BKR BUN / CREAT RATIO: 14.8 (ref 8.0–23.0)
BKR CALCIUM: 9.7 mg/dL (ref 8.8–10.2)
BKR CHLORIDE: 106 mmol/L (ref 98–107)
BKR CO2: 26 mmol/L (ref 20–30)
BKR CREATININE DELTA: 0.07
BKR CREATININE: 0.61 mg/dL (ref 0.40–1.30)
BKR EGFR, CREATININE (CKD-EPI 2021): >60 mL/min/{1.73_m2} (ref >=60)
BKR GLOBULIN: 3.3 g/dL (ref 2.0–3.9)
BKR GLUCOSE: 97 mg/dL (ref 70–100)
BKR POTASSIUM: 4.6 mmol/L (ref 3.3–5.3)
BKR PROTEIN TOTAL: 7.4 g/dL (ref 5.9–8.3)
BKR SODIUM: 142 mmol/L (ref 136–144)

## 2023-08-10 LAB — VITAMIN D, 25-HYDROXY: BKR VITAMIN D 25-HYDROXY TOTAL: 12 ng/mL

## 2023-08-10 LAB — SEDIMENTATION RATE (ESR): BKR SEDIMENTATION RATE, ERYTHROCYTE: 42 mm/h — ABNORMAL HIGH (ref 0–20)

## 2023-08-10 LAB — C-REACTIVE PROTEIN     (CRP): BKR C-REACTIVE PROTEIN, HIGH SENSITIVITY: 10.7 mg/L — ABNORMAL HIGH

## 2023-08-11 ENCOUNTER — Encounter: Admit: 2023-08-11 | Payer: PRIVATE HEALTH INSURANCE | Attending: Rheumatology | Primary: Internal Medicine

## 2023-08-11 ENCOUNTER — Telehealth: Admit: 2023-08-11 | Payer: PRIVATE HEALTH INSURANCE | Attending: Rheumatology | Primary: Internal Medicine

## 2023-08-11 DIAGNOSIS — R748 Abnormal levels of other serum enzymes: Secondary | ICD-10-CM

## 2023-08-11 NOTE — Telephone Encounter
 Copied from CRM [id number]. Topic: [facility] General CRM - General Inquiry>> Aug 11, 2023  9:56 AM [place] F wrote:Evans, Cathy called regarding Patient called requesting a call backIn reference to her lab resultsShe is requesting a referral to GI departmentPatient can be reached @ [phone] TC [name] states she spoke to communicated with Dr Annye Rusk about her labs and she needs a GI referral she does not have a specialist. She has not had a recent Abd [place] she does not take any medication including Tylenol and no alcohol. She would like to go to [address] if provider avail there otherwise soonest appt anywhere. Advised will call her back with directive from Dr Annye Rusk. AJM

## 2023-08-11 NOTE — Other
 Labs reviewed

## 2023-08-12 ENCOUNTER — Telehealth: Admit: 2023-08-12 | Payer: PRIVATE HEALTH INSURANCE | Attending: Cardiovascular Disease | Primary: Internal Medicine

## 2023-08-12 ENCOUNTER — Encounter
Admit: 2023-08-12 | Payer: PRIVATE HEALTH INSURANCE | Attending: Student in an Organized Health Care Education/Training Program | Primary: Internal Medicine

## 2023-08-12 NOTE — Telephone Encounter
 Patient states she went for a [place] of her heart and did not have it done because she does not want to take nitroglycerin. Patient is asking if order can be place without having to take the nitroglycerin.

## 2023-08-12 NOTE — Telephone Encounter
 Reviewed with patient why nitroglycerin is required for CCTA, all questions answered, patient now willing to receive nitro. All questions/concerned answered.

## 2023-08-14 NOTE — Other
 [facility] ED Observation Progress NoteAdmit to Observation Status: Obs date: 07/28/2023  9:12 PMCondition:ImprovedVital signs: Normal  Yes BP 118/74  - Pulse 65  - Temp 98 ?F (36.7 ?C) (Oral)  - Resp 18  - Wt 83.9 kg  - LMP 08/30/2017 (Exact Date) Comment: hyst 08/30/2017 - SpO2 100%  - BMI 32.77 kg/m? Vital signs reviewed per nursing notesHistory of Present IllnessA 58 year old patient woke up around 1 AM with sudden onset of painless blurry vision in the right eye. The symptom was accompanied by clear tearing. The patient went back to sleep and upon waking up in the morning, noticed an improvement in the vision. There was no associated eye pain or redness. The patient also reported weakness in the right upper and lower extremity, but no other neurological deficits were noted. Patient also endorses intermittent dizziness x 1 week.Physical ExamGEN well appearingHEAD normocephalicCV RRRLUNGS no resp disterssNEUROLOGICAL: 4/5 strength in right upper and lower extremity. No other neurological deficits.SKIN w/dPSYCH appropriateResultsRADIOLOGYCT head and neck: No acute intracranial pathology. No large vessel occlusion. No significant cervical artery stenosis. (07/29/2023)DIAGNOSTICEKG: Normal sinus rhythm (07/29/2023)Assessment and PlanStroke-like symptoms  Sudden onset of right-sided weakness and blurry vision suggests a stroke or transient ischemic attack. Initial [place] findings were negative for acute pathology. Neurology is involved for further evaluation. Consult neurology for further evaluation. Discharge with outpatient neurology clinic follow-up if MRI shows no acute findings.Right-sided weakness  Weakness with 4/5 strength in the right upper and lower extremities. No other neurological deficits. Initial [place] head and neck showed no acute intracranial pathology, large vessel occlusion, or significant cervical artery stenosis. Neurology recommended an MRI of the brain to rule out stroke-like symptoms. Order MRI of the brain. Consult neurology for final recommendations.Blurry vision in the right eye  Sudden onset of painless blurry vision in the right eye at 1 AM, with clear tearing but no redness or pain. Symptoms improved by morning but persisted, prompting an emergency room visit. Ophthalmology suggested symptoms might be related to dry eye. Place in ED observation pending MRI brain and final neurology recommendations.Plan:MRI brainneurology recsif no acute findings on MRI imaging pt can be dc'd with outpt neurology follow upMri brain-->No acute intracranial abnormality.[facility] Radiology Notify System Classification: Routine.Dw neurology of for dc Results dw pt Pt will pick up otc artificial tears at pharmacyPmd and outpt ophalmology follow up as neededStable for dc Dr Janice Norrie available for consult as needed ------------------------------Discharge from ED ObservationMRI unremarkableD/W neuro and cleared for dischargeCondition: UnchangedPE: General:  Distress: no distressHeart:  normal rateLungs:  No respiratory distressDiscussed Hospital Stay: No: D/C Records Prepared: YesI discussed the case with supervisee, agree with as note supervisee E/M with corrections /additions as Jeri Cos MD MPH------------------------------------------Paul Jacquiline Doe NP------------------------------------------------------Response to management: improvedObservation course: mri imaging no acute findings Cecille Rubin, APRN03/21/25 0507 Roney Marion, MD MPH04/06/25 [phone]

## 2023-08-15 ENCOUNTER — Ambulatory Visit: Admit: 2023-08-15 | Payer: PRIVATE HEALTH INSURANCE | Primary: Internal Medicine

## 2023-08-16 NOTE — ED Provider Notes
 [facility]  -  [facility] HealthEmergency Department VisitDate: 3/20/2025Name: Cathy Evans   -   DoB: February 17, 1966   -   MRN: [id number] Chief Complaint Patient presents with  Eye Problem HPI Patient is a 58 y.o. female with PMHx of HTN, CKD, PCKD, mitral regurgitation, pulmonary sarcoidosis here for right eye blurry vision.Patient went to sleep around midnight last night asymptomatic, no painful eye or blurry visionShe woke between 1-2am with new severe blurry vision of the right eye without pain, redness, or Her visual fields of her left eye were in tact around that time swellingNo new headaches, nuchal rigidity, fevers, or recent URI symptoms associated with her blurry visionDenies any subjective focal weaknesses or paresthesias/numbnessNo chest pain, no acute on chronic SOB. No abdominal pain, N&V, blood or changes in stool/urineOf note, patient notes she's been feeling a little off balance the past weekShe notes lack of medication compliance x13yr now. Does not like taking medicationNotes recent diagnosis of Diabetes and not taking her prescribed MounjaroNo History of tobacco smoke, no drug use, rare Et OH consumptionNo recent surgeries or proceduresMDM Presentation most concerning for CVA vs. TIA vs. Glycemic disturbance vs. Retinal or vitreous detachmentLess likely GCA   /  Less likely autoimmune, infectious, or glaucoma etiology given painless and non-injected eyesWORK UPLabs:   POC Glucose /  CBC / BMP / CoagsImaging:  [place]  +  CTA Head-neckRESULTSLabs benignCT head & CTA head-neck unremarkableNeurology on board and would like to proceed with MRI brain for further evaluationOphthalmology on board and notes evidence of dry eyes on slit lamp exam, otherwise normal eye examRecs include artificial tears QID  +  Ophthalmic ointment nightlyPatient placed in ED obs pending MRIMDM:Case discussed with Rosana Hoes, MDObjective: Vitals:BP (!) 154/98  - Pulse (!) 92  - Temp 98 ?F (36.7 ?C) (Oral)  - Resp 17  - Wt 83.9 kg  - LMP 08/30/2017 (Exact Date) Comment: hyst 08/30/2017 - SpO2 100%  - BMI 32.77 kg/m? Physical Exam:Physical ExamConstitutional:     General: She is not in acute distress.   Appearance: Normal appearance. She is not ill-appearing or toxic-appearing. HENT:    Head:    Comments: No injected sclera or chemosis bilateral eyesNo temporal tenderness bilaterally   Mouth/Throat:    Mouth: Mucous membranes are moist. Eyes:    Extraocular Movements: Extraocular movements intact.    Pupils: Pupils are equal, round, and reactive to light. Cardiovascular:    Rate and Rhythm: Normal rate and regular rhythm.    Pulses: Normal pulses.    Heart sounds: Normal heart sounds. No murmur heard.Pulmonary:    Effort: Pulmonary effort is normal. No respiratory distress.    Breath sounds: Normal breath sounds. No wheezing or rhonchi. Abdominal:    Palpations: Abdomen is soft.    Tenderness: There is no abdominal tenderness. Musculoskeletal:    Cervical back: Normal range of motion. No rigidity. Neurological:    Mental Status: She is alert.    Cranial Nerves: No cranial nerve deficit (II-XII).    Sensory: No sensory deficit.    Motor: Weakness (4/5 stregnth right extremities compared to 5/5 stregnth left extremities) present. Procedures:ProceduresAttestation/Critical CarePatient Reevaluation: Attending Supervised: ResidentI saw and examined the patient. I agree with the findings and plan of care as documented in the resident's note except as noted below. Additional acute and/or chronic problems addressed: Pincus Sanes yo M with DM, HTN (not taking medications x 1 year), pulmonary sarcoidosis p/w 1 week of feeling off balance with  right eye blurriness starting at midnight last night, improved this morning, but has not resolved. Has also Has not had anything similar in past. Denies drugs, alcohol, tobacco. AVSS. PEARLA. Right sided arm and leg weakness. No right temporal TTP. Ddx: TIA vs CVA, hyperglycemia, retinal detachment, CRVOKathryn Minerva Areola, MDADMISSION TO ED OBSERVATIONAdmit to Observation Status: Obs date: 07/28/2023  9:12 PM 83.9 kgIndication for observation: Diagnostic testing (MRI, 6hr [place] etc)In addition to serial exams and monitoring of hemodynamics, other anticipated interventions include: Diagnostic testingRelevant home medications to be ordered by me and patient handoff given per protocol Discharge criteria: Imaging  Clinical Impressions as of 08/16/23 1529 Blurry vision Arm weakness Dispo:   ObservationJordan Dorelus, PA-1 EM ResidentEmergency MedicineAvailable on MHB Laurian Brim, MD03/20/25 1620 Laurian Brim, MD03/20/25 1645 Dorelus, [place] Aniel, PA03/20/25 1704 Dorelus, [place] Aniel, PA03/20/25 1705 Dorelus, [place] Aniel, PA03/20/25 2123 Laurian Brim, MD04/08/25 1530

## 2023-08-25 ENCOUNTER — Inpatient Hospital Stay: Admit: 2023-08-25 | Discharge: 2023-08-25 | Payer: Medicare (Managed Care) | Primary: Internal Medicine

## 2023-08-25 ENCOUNTER — Inpatient Hospital Stay: Admit: 2023-08-25 | Discharge: 2023-08-25 | Payer: PRIVATE HEALTH INSURANCE | Primary: Internal Medicine

## 2023-08-25 DIAGNOSIS — R7401 Elevation of levels of liver transaminase levels: Principal | ICD-10-CM

## 2023-08-25 DIAGNOSIS — R748 Abnormal levels of other serum enzymes: Principal | ICD-10-CM

## 2023-08-25 LAB — VITAMIN D, 25-HYDROXY: BKR VITAMIN D 25-HYDROXY TOTAL: 22 ng/mL

## 2023-08-25 LAB — HEPATIC FUNCTION PANEL
BKR A/G RATIO: 1.1 (ref 1.0–2.2)
BKR ALANINE AMINOTRANSFERASE (ALT): 54 U/L — ABNORMAL HIGH (ref 10–35)
BKR ALBUMIN: 4.1 g/dL (ref 3.6–5.1)
BKR ALKALINE PHOSPHATASE: 176 U/L — ABNORMAL HIGH (ref 9–122)
BKR ASPARTATE AMINOTRANSFERASE (AST): 53 U/L — ABNORMAL HIGH (ref 10–35)
BKR AST/ALT RATIO: 1
BKR BILIRUBIN DIRECT: 0.1 mg/dL (ref <=0.2)
BKR BILIRUBIN TOTAL: 0.5 mg/dL (ref <=1.2)
BKR GLOBULIN: 3.9 g/dL (ref 2.0–3.9)
BKR PROTEIN TOTAL: 8 g/dL (ref 5.9–8.3)

## 2023-08-25 NOTE — Other
 RUQ U/S reviewedSpoke to Eulanda over the phone, will refer to [facility].

## 2023-08-26 NOTE — Other
 Discussed lab work with patient. LFTs remain similar to prior; patient will schedule f/u with GI. 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

## 2023-08-29 ENCOUNTER — Ambulatory Visit: Admit: 2023-08-29 | Payer: PRIVATE HEALTH INSURANCE | Primary: Internal Medicine

## 2023-08-30 ENCOUNTER — Inpatient Hospital Stay: Admit: 2023-08-30 | Discharge: 2023-08-30 | Payer: Medicare (Managed Care) | Primary: Internal Medicine

## 2023-08-30 ENCOUNTER — Encounter: Admit: 2023-08-30 | Payer: PRIVATE HEALTH INSURANCE | Primary: Internal Medicine

## 2023-08-30 VITALS — Ht 63.0 in | Wt 185.2 lb

## 2023-08-30 DIAGNOSIS — I10 Essential (primary) hypertension: Secondary | ICD-10-CM

## 2023-08-30 DIAGNOSIS — K219 Gastro-esophageal reflux disease without esophagitis: Secondary | ICD-10-CM

## 2023-08-30 DIAGNOSIS — D573 Sickle-cell trait: Secondary | ICD-10-CM

## 2023-08-30 DIAGNOSIS — Z803 Family history of malignant neoplasm of breast: Secondary | ICD-10-CM

## 2023-08-30 DIAGNOSIS — R002 Palpitations: Secondary | ICD-10-CM

## 2023-08-30 DIAGNOSIS — Z1231 Encounter for screening mammogram for malignant neoplasm of breast: Secondary | ICD-10-CM

## 2023-08-30 DIAGNOSIS — N189 Chronic kidney disease, unspecified: Secondary | ICD-10-CM

## 2023-08-30 DIAGNOSIS — E559 Vitamin D deficiency, unspecified: Secondary | ICD-10-CM

## 2023-08-30 DIAGNOSIS — K200 Eosinophilic esophagitis: Secondary | ICD-10-CM

## 2023-08-30 DIAGNOSIS — Z87828 Personal history of other (healed) physical injury and trauma: Secondary | ICD-10-CM

## 2023-08-30 DIAGNOSIS — Q613 Polycystic kidney, unspecified: Secondary | ICD-10-CM

## 2023-08-30 DIAGNOSIS — I340 Nonrheumatic mitral (valve) insufficiency: Secondary | ICD-10-CM

## 2023-08-31 ENCOUNTER — Encounter: Admit: 2023-08-31 | Payer: PRIVATE HEALTH INSURANCE | Primary: Internal Medicine

## 2023-09-01 NOTE — Other
 Normal mammogram. Routine screening in 1-2 years. Will leave MyChart comment.

## 2023-09-05 ENCOUNTER — Ambulatory Visit: Admit: 2023-09-05 | Payer: PRIVATE HEALTH INSURANCE | Primary: Internal Medicine

## 2023-09-08 ENCOUNTER — Telehealth: Admit: 2023-09-08 | Payer: PRIVATE HEALTH INSURANCE | Attending: Cardiovascular Disease | Primary: Internal Medicine

## 2023-09-08 NOTE — Telephone Encounter
 Called and LVM w/new appt date and time

## 2023-09-09 ENCOUNTER — Telehealth: Admit: 2023-09-09 | Payer: PRIVATE HEALTH INSURANCE | Primary: Internal Medicine

## 2023-09-14 NOTE — Telephone Encounter
 First attempt made to conduct a pre-procedure call for an upper EUS w/ FNA on 09/23/2023 at [facility]. Patient contact not made. LVM for patient to return call to the GI Navigation department.1341: RTC to patient. LVM, with a callback number, to call GI Navigation. 1427: RTC to patient. LVM, with a callback number, to call GI Navigation.

## 2023-09-15 ENCOUNTER — Encounter: Admit: 2023-09-15 | Payer: PRIVATE HEALTH INSURANCE | Attending: Internal Medicine | Primary: Internal Medicine

## 2023-09-15 ENCOUNTER — Encounter: Admit: 2023-09-15 | Payer: PRIVATE HEALTH INSURANCE | Attending: Anesthesiology | Primary: Internal Medicine

## 2023-09-15 DIAGNOSIS — R002 Palpitations: Secondary | ICD-10-CM

## 2023-09-15 DIAGNOSIS — I340 Nonrheumatic mitral (valve) insufficiency: Secondary | ICD-10-CM

## 2023-09-15 DIAGNOSIS — N189 Chronic kidney disease, unspecified: Secondary | ICD-10-CM

## 2023-09-15 DIAGNOSIS — D573 Sickle-cell trait: Secondary | ICD-10-CM

## 2023-09-15 DIAGNOSIS — K200 Eosinophilic esophagitis: Secondary | ICD-10-CM

## 2023-09-15 DIAGNOSIS — Q613 Polycystic kidney, unspecified: Secondary | ICD-10-CM

## 2023-09-15 DIAGNOSIS — I341 Nonrheumatic mitral (valve) prolapse: Secondary | ICD-10-CM

## 2023-09-15 DIAGNOSIS — E559 Vitamin D deficiency, unspecified: Secondary | ICD-10-CM

## 2023-09-15 DIAGNOSIS — Z5189 Encounter for other specified aftercare: Secondary | ICD-10-CM

## 2023-09-15 DIAGNOSIS — E119 Type 2 diabetes mellitus without complications: Secondary | ICD-10-CM

## 2023-09-15 DIAGNOSIS — Z87828 Personal history of other (healed) physical injury and trauma: Secondary | ICD-10-CM

## 2023-09-15 DIAGNOSIS — I10 Essential (primary) hypertension: Secondary | ICD-10-CM

## 2023-09-15 DIAGNOSIS — K219 Gastro-esophageal reflux disease without esophagitis: Secondary | ICD-10-CM

## 2023-09-15 NOTE — Other
 ReviewReview NP: Vernell Morgans

## 2023-09-15 NOTE — Telephone Encounter
 Patient is scheduled for an upper EUS w/ FNA on 09/23/2023 at [facility]. Pre procedure call performed. Discharge transportation policy & NPO instructions given per pre procedure guidelines. Patient instructed to avoid NSAIDs 1 week prior to the procedure.  Patient verbalized her understanding.

## 2023-09-16 NOTE — Telephone Encounter
 Cathy Evans is scheduled for an upper EUS/FNA on 5/16 with Dr. Emilie Harden. Will need pre-op instructions for glp-1 as per anesthesia protocol. TC to Rumi. Left message to return call. [name], BSN, RNClinical Nurse Coordinator5/9 10:35 TC to [place] to confirm she has instructions for [facility]. She stated she is not on Mounjaro. She added that she is taking OTC Flonase  and eye drops for dry eyes. JW

## 2023-09-23 ENCOUNTER — Encounter: Admit: 2023-09-23 | Payer: PRIVATE HEALTH INSURANCE | Attending: Internal Medicine | Primary: Internal Medicine

## 2023-09-23 ENCOUNTER — Ambulatory Visit: Admit: 2023-09-23 | Payer: PRIVATE HEALTH INSURANCE | Attending: Anesthesiology | Primary: Internal Medicine

## 2023-09-23 ENCOUNTER — Inpatient Hospital Stay
Admit: 2023-09-23 | Discharge: 2023-09-23 | Payer: PRIVATE HEALTH INSURANCE | Attending: Internal Medicine | Primary: Internal Medicine

## 2023-09-23 ENCOUNTER — Telehealth: Admit: 2023-09-23 | Payer: PRIVATE HEALTH INSURANCE | Primary: Internal Medicine

## 2023-09-23 DIAGNOSIS — N189 Chronic kidney disease, unspecified: Secondary | ICD-10-CM

## 2023-09-23 DIAGNOSIS — Z8719 Personal history of other diseases of the digestive system: Secondary | ICD-10-CM

## 2023-09-23 DIAGNOSIS — Z9049 Acquired absence of other specified parts of digestive tract: Secondary | ICD-10-CM

## 2023-09-23 DIAGNOSIS — Z882 Allergy status to sulfonamides status: Secondary | ICD-10-CM

## 2023-09-23 DIAGNOSIS — R160 Hepatomegaly, not elsewhere classified: Secondary | ICD-10-CM

## 2023-09-23 DIAGNOSIS — Z881 Allergy status to other antibiotic agents status: Secondary | ICD-10-CM

## 2023-09-23 DIAGNOSIS — K219 Gastro-esophageal reflux disease without esophagitis: Secondary | ICD-10-CM

## 2023-09-23 DIAGNOSIS — E119 Type 2 diabetes mellitus without complications: Secondary | ICD-10-CM

## 2023-09-23 DIAGNOSIS — I341 Nonrheumatic mitral (valve) prolapse: Secondary | ICD-10-CM

## 2023-09-23 DIAGNOSIS — Z888 Allergy status to other drugs, medicaments and biological substances status: Secondary | ICD-10-CM

## 2023-09-23 DIAGNOSIS — Z87828 Personal history of other (healed) physical injury and trauma: Secondary | ICD-10-CM

## 2023-09-23 DIAGNOSIS — R933 Abnormal findings on diagnostic imaging of other parts of digestive tract: Secondary | ICD-10-CM

## 2023-09-23 DIAGNOSIS — D860 Sarcoidosis of lung: Secondary | ICD-10-CM

## 2023-09-23 DIAGNOSIS — D573 Sickle-cell trait: Secondary | ICD-10-CM

## 2023-09-23 DIAGNOSIS — I340 Nonrheumatic mitral (valve) insufficiency: Secondary | ICD-10-CM

## 2023-09-23 DIAGNOSIS — I10 Essential (primary) hypertension: Secondary | ICD-10-CM

## 2023-09-23 DIAGNOSIS — Q613 Polycystic kidney, unspecified: Secondary | ICD-10-CM

## 2023-09-23 DIAGNOSIS — R002 Palpitations: Secondary | ICD-10-CM

## 2023-09-23 DIAGNOSIS — K200 Eosinophilic esophagitis: Secondary | ICD-10-CM

## 2023-09-23 DIAGNOSIS — E559 Vitamin D deficiency, unspecified: Secondary | ICD-10-CM

## 2023-09-23 DIAGNOSIS — I898 Other specified noninfective disorders of lymphatic vessels and lymph nodes: Secondary | ICD-10-CM

## 2023-09-23 DIAGNOSIS — Z79899 Other long term (current) drug therapy: Secondary | ICD-10-CM

## 2023-09-23 DIAGNOSIS — Z5189 Encounter for other specified aftercare: Secondary | ICD-10-CM

## 2023-09-23 DIAGNOSIS — Z880 Allergy status to penicillin: Secondary | ICD-10-CM

## 2023-09-23 MED ORDER — SODIUM CHLORIDE 0.9 % (FLUSH) INJECTION SYRINGE
0.9 | Freq: Three times a day (TID) | INTRAVENOUS | Status: DC
Start: 2023-09-23 — End: 2023-09-23

## 2023-09-23 MED ORDER — LIDOCAINE (PF) 20 MG/ML (2 %) INTRAVENOUS SOLUTION
20 | INTRAVENOUS | Status: DC | PRN
Start: 2023-09-23 — End: 2023-09-23
  Administered 2023-09-23: 08:00:00 20 mg/mL (2 %) via INTRAVENOUS

## 2023-09-23 MED ORDER — MIDAZOLAM 1 MG/ML INJECTION SOLUTION
1 | INTRAVENOUS | Status: DC | PRN
Start: 2023-09-23 — End: 2023-09-23
  Administered 2023-09-23: 08:00:00 1 mg/mL via INTRAVENOUS

## 2023-09-23 MED ORDER — LACTATED RINGERS INTRAVENOUS SOLUTION
INTRAVENOUS | Status: DC | PRN
Start: 2023-09-23 — End: 2023-09-23
  Administered 2023-09-23: 08:00:00 via INTRAVENOUS

## 2023-09-23 MED ORDER — FENTANYL (PF) 50 MCG/ML INJECTION SOLUTION
50 | INTRAVENOUS | Status: DC | PRN
Start: 2023-09-23 — End: 2023-09-23
  Administered 2023-09-23 (×3): 50 mcg/mL via INTRAVENOUS

## 2023-09-23 MED ORDER — SODIUM CHLORIDE 0.9 % INTRAVENOUS SOLUTION
INTRAVENOUS | Status: DC
Start: 2023-09-23 — End: 2023-09-23

## 2023-09-23 MED ORDER — PROPOFOL 10 MG/ML INTRAVENOUS EMULSION
10 | INTRAVENOUS | Status: DC | PRN
Start: 2023-09-23 — End: 2023-09-23
  Administered 2023-09-23 (×3): 10 mg/mL via INTRAVENOUS

## 2023-09-23 MED ORDER — SODIUM CHLORIDE 0.9 % (FLUSH) INJECTION SYRINGE
0.9 | INTRAVENOUS | Status: DC | PRN
Start: 2023-09-23 — End: 2023-09-23

## 2023-09-23 MED ORDER — SIMETHICONE 40 MG/0.6 ML ORAL DROPS,SUSPENSION
40 | Status: DC | PRN
Start: 2023-09-23 — End: 2023-09-23

## 2023-09-23 MED ORDER — PROPOFOL 10 MG/ML INTRAVENOUS EMULSION
10 | INTRAVENOUS | Status: DC | PRN
Start: 2023-09-23 — End: 2023-09-23
  Administered 2023-09-23: 08:00:00 10 mL/h via INTRAVENOUS

## 2023-09-23 MED ORDER — FENTANYL (PF) 50 MCG/ML INJECTION SOLUTION
50 | Status: CP
Start: 2023-09-23 — End: ?

## 2023-09-23 MED ORDER — ONDANSETRON HCL (PF) 4 MG/2 ML INJECTION SOLUTION
4 | INTRAVENOUS | Status: DC | PRN
Start: 2023-09-23 — End: 2023-09-23
  Administered 2023-09-23: 09:00:00 4 mg/2 mL via INTRAVENOUS

## 2023-09-23 MED ORDER — DEXAMETHASONE SODIUM PHOSPHATE 4 MG/ML INJECTION SOLUTION
4 | INTRAVENOUS | Status: DC | PRN
Start: 2023-09-23 — End: 2023-09-23
  Administered 2023-09-23: 09:00:00 4 mg/mL via INTRAVENOUS

## 2023-09-23 MED ORDER — MIDAZOLAM 1 MG/ML INJECTION SOLUTION
1 | Status: CP
Start: 2023-09-23 — End: ?

## 2023-09-23 MED ORDER — LACTATED RINGERS INTRAVENOUS SOLUTION
INTRAVENOUS | Status: DC
Start: 2023-09-23 — End: 2023-09-23

## 2023-09-23 MED ORDER — ONDANSETRON HCL (PF) 4 MG/2 ML INJECTION SOLUTION
4 | INTRAVENOUS | Status: DC | PRN
Start: 2023-09-23 — End: 2023-09-23

## 2023-09-23 NOTE — Other
 Operative Diagnosis:Pre-op:   * No pre-op diagnosis entered * Patient Coded Diagnosis   Pre-op diagnosis: Peripancreatic fluid collection (HC CODE)  Post-op diagnosis: Peripancreatic fluid collection (HC CODE)  Patient Diagnosis   None    Post-op diagnosis:   * Peripancreatic fluid collection (HC CODE) [K86.89]Operative Procedure(s) :Procedure(s) (LRB):UPPER EUS/FNA (N/A)Post-op Procedure & Diagnosis ConfirmationPost-op Diagnosis: Post-op Diagnosis confirmed (no changes)Post-op Procedure: Post-op Procedure confirmed (no changes)

## 2023-09-23 NOTE — Anesthesia Post-Procedure Evaluation
 Anesthesia Post-op NotePatient: Cathy ObidagoProcedure(s):  Procedure(s) (LRB):UPPER EUS/FNA (N/A) Last Vitals:  I have reviewed the post-operative vital signs as noted in the Epic chart.POSTOP EVALUATION:      Patient Recovery Location:  PACU     Vital Signs Status:  Stable     Patient Participation:  Patient participated     Mental Status:  Awake     Respiratory Status:  Acceptable     Airway Patency:  Patent     Cardiovascular/Hydration Status:  Stable     Pain Management:  Satisfactory to patient     Nausea/Vomiting Status:  Satisfactory to patientNo notable events documented.

## 2023-09-23 NOTE — H&P
 [place] [facility]	 [facility]	Gastroenterology History & PhysicalHPI: Based on US : A nodular soft tissue mass lesion is identified in the porta hepatis. This measures up to 4.0 x 2.4 cm. This can be seen within the region of the pancreatic head. This may represent a peripancreatic lymph node, or pancreatic head mass. A nodular soft tissue mass lesion may be present on the basis of the previous MR, however on that examination, the patient did refuse contrast at that time. A peripancreatic lymph node was identified on the basis of a previous MR dated 01/29/2022. History of pulmonary sarcoidosisMedical History: PMH PSH Past Medical History: Diagnosis Date  Chronic kidney disease   PKD, but normal creatinine  Diabetes mellitus (HC Code)    Encounter for blood transfusion   per pt, no rxn  Eosinophilic esophagitis 08/26/2020  GERD (gastroesophageal reflux disease)   History of torn meniscus of right knee 02/2022  Hypertension   Mitral valve prolapse   Mitral valve regurgitation   Palpitations   Polycystic kidney disease   Sickle cell trait (HC Code)   Natera testing 2021  Vitamin D deficiency   Past Surgical History: Procedure Laterality Date  APPENDECTOMY  1984  BRONCHOSCOPY    w/ EBUS  COLONOSCOPY    DILATION AND CURETTAGE OF UTERUS    HYSTERECTOMY  08/30/2017  LAPAROSCOPY    LEEP    TONSILLECTOMY    TUBAL LIGATION    prior to the hysterectomy  UPPER GASTROINTESTINAL ENDOSCOPY    Social History Family History Social History Tobacco Use  Smoking status: Never   Passive exposure: Yes  Smokeless tobacco: Never Substance Use Topics  Alcohol use: Not Currently   Comment: social  Family History Problem Relation Age of Onset  High cholesterol Mother   Hypertension Mother   Stroke Mother   Diabetes Father   Kidney disease Father   Hyperthyroidism Sister   Ovarian cancer Maternal Aunt   Heart attack Paternal Uncle   Breast cancer Maternal Grandmother 52  Diabetes Brother   Hypertension Brother   Diabetes Brother   Asthma Son   Prior to Admission Medications Medications Prior to Admission Medication Sig Dispense Refill Last Dose/Taking  atovaquone  (MEPRON ) 750 mg/5 mL suspension Take 5 mLs (750 mg total) by mouth every 12 (twelve) hours. 210 mL 1   BLOOD GLUCOSE METER (ONETOUCH ULTRA2 METER) device USE TO CHECK BLOOD SUGAR DAILY E11.9 1 each 0   blood sugar diagnostic (GLUCOSE BLOOD) test strips Testing Frequency: Three times daily DX E11.9. Accucheck test strips. 100 each 3   diclofenac (VOLTAREN) 1 % gel Apply topically 4 (four) times daily. 100 g 2   lancets (ACCU-CHEK SOFTCLIX LANCETS) USE AS DIRECTED TO CHECK BLOOD SUGAR UP TO 3 TIMES DAILY E11.9 100 each 3   lidocaine  (ASPERCREME, LIDOCAINE ,) 4 % topical patch Place 1 patch over 12 hours onto the skin every 24 hours. Remove & Discard patch within 12 hours or as directed by MD 30 patch 1   losartan  (COZAAR ) 25 mg tablet Take 1 tablet (25 mg total) by mouth daily. 90 tablet 3   rosuvastatin  (CRESTOR ) 10 mg tablet Take 1 tablet (10 mg total) by mouth nightly. 90 tablet 3   tirzepatide (MOUNJARO) 2.5 mg/0.5 mL Pen Injector INJECT 1 PEN (2.5 MG) SUBCUTANEOUSLY ONE TIME PER WEEK (Patient not taking: Reported on 09/16/2023) 0.5 mL 1   triamcinolone (KENALOG) 0.1 % ointment Apply topically 2 (two) times daily. 30 g 1   Allergies Allergies Allergen Reactions  Penicillins Hives   Hives Has patient had a PCN reaction causing immediate rash, facial/tongue/throat swelling, SOB or lightheadedness with hypotension: YESHas patient had a PCN reaction causing severe rash involving mucus membranes or skin necrosis: NOHas patient had a PCN reaction that required hospitalization NOHas patient had a PCN reaction occurring within the last 10 years: NOIf all of the above answers are NO, then may proceed with Cephalosporin use.  Sulfa (Sulfonamide Antibiotics) Hives and Rash  Sulfamethoxazole-Trimethoprim Hives  Sulfasalazine Hives   rashrash  Moxifloxacin Rash  Family History: non-contributoryReview of Systems:  ROS: no SOB or CPNo fever, weight lossNo abdominal pain, diarrhea or constipationNo rashNo arthralgias or myalgiasObjective: Vitals:Last 24 hours: Temp:  [97.8 ?F (36.6 ?C)] 97.8 ?F (36.6 ?C)Pulse:  [91] 91Resp:  [18] 18BP: (147)/(90) 147/90SpO2:  [100 %] 100 %Physical Exam: GEN: NAD, A&OCV: RRRRESP: CTABABD: Soft, NT, NDAssessment: Based on US . A nodular soft tissue mass lesion is identified in the porta hepatis. This measures up to 4.0 x 2.4 cm. This can be seen within the region of the pancreatic head. This may represent a peripancreatic lymph node, or pancreatic head mass. A nodular soft tissue mass lesion may be present on the basis of the previous MR, however on that examination, the patient did refuse contrast at that time. A peripancreatic lymph node was identified on the basis of a previous MR dated 01/29/2022. Endoscopic ultrasoundindicated for further evaluationPlan: EGD/EUS with possible FNA. The procedure has been discussed in detail with the patient, including risks/benefits. Risks (if FNA is performed) include 1/100 chance of bleeding, perforation, pancreatitis, or infection. The patient understands these risks and would like to proceed with the procedure Signed:Diannah Rindfleisch Roselle Conner, MD 5/16/20258:00 AM

## 2023-09-23 NOTE — Discharge Instructions
 Endoscopy & ColonoscopyAfter Care In case of any problems after your procedure, please call [phone] or go to the nearest Emergency Room for any urgent or severe symptoms. Normal EGDMultiple lymph nodes 10 to 40 mm. BiopsiedMultiple liver masses. Not biopsiesNormal pancreasNormal gallbladderPlease call for biopsy or cytology results within 5-7 days [phone] see copy of procedure report.HOME CARE INSTRUCTIONSActivityYou may resume your regular activity but take frequent rest periods for the next 24 hours.You may experience abdominal discomfort such as a feeling of fullness or gas pains.Walking will help expel (get rid of) the air and reduce the bloated feeling in your abdomen.You may experience a sore throat for 2 to 3 days (if you had an upper endoscopy). This is normal. Gargling with salt water may help this.You may shower.A companion MUST drive you home, as the sedation impairs your reflexes and judgments.For the remainder of the day, you should not operate any vehicle or heavy machinery or make important decisions due to the sedation.  We recommend resting quietly.NutritionDrink plenty of fluids.Begin with a light meal and progress to your normal diet as tolerated.Avoid alcoholic beverages for 24 hours or as instructed by your caregiver.MedicationsYou may resume your normal medications unless your caregiver tells you otherwise.Follow-up* Please check out with the front desk [phone] before leaving to see if and when your follow up should be scheduled. You will be called within 5-7 business days to schedule a telehealth appointment with our clinicians to review the biopsy/cytology results of your procedure.  If you do not hear from anyone or if you are concerned, please call  [phone].SEEK IMMEDIATE MEDICAL CARE IF:You have excessive nausea (feeling sick to your stomach) and/or vomiting.You have severe abdominal pain and distention (swelling).You have trouble swallowing.You have a temperature over 100? F (37.8? C).You have rectal bleeding, black tarry stools or vomiting of blood.You are unable to urinate or pass bowel movements.Drs. Avie Lemme, Aslanian, Leonard Hendler, Muniraj & Erich Haus want you to have the best procedure experience possible.Please call with any questions, concerns or problems.M-F 8:30 am-4:30 pm at [phone]________________________________________________________________________July 2017

## 2023-09-23 NOTE — Other
 Post Anesthesia Transfer of Care NotePatient: Cathy ObidagoProcedure(s) Performed: Procedure(s) (LRB):UPPER EUS/FNA (N/A)Last Vitals: I have reviewed the post-operative vital signs during the handoff as noted in the Epic chart.POSTOP HANDOFF :      Patient Location:  PACU     Level of Consciousness:  Sedated     VS stable since last recorded intra-op set? Yes       Oxygen source: maskPatient co-morbidities, intra-operative course, intake & output and antibiotics as per Anesthesia record were discussed with the RN.

## 2023-09-26 ENCOUNTER — Telehealth: Admit: 2023-09-26 | Payer: PRIVATE HEALTH INSURANCE | Attending: Internal Medicine | Primary: Internal Medicine

## 2023-09-26 ENCOUNTER — Encounter: Admit: 2023-09-26 | Payer: PRIVATE HEALTH INSURANCE | Attending: Internal Medicine | Primary: Internal Medicine

## 2023-09-26 DIAGNOSIS — K769 Liver disease, unspecified: Secondary | ICD-10-CM

## 2023-09-26 NOTE — Telephone Encounter
 Pt returned call to RN coordinator she confirmed that she does not have an IV allergyGave pt DI scheduling #Please place MRI order

## 2023-09-26 NOTE — Telephone Encounter
 Pt had procedure on 09/23/23 with Dr Emilie Harden:- Many enlarged lymph nodes were visualized in the                       subcarinal mediastinum (level 7), celiac region (level                       20), perigastric region, peripancreatic region, porta                       hepatis region and aortocaval region ( 5-40 mm). Fine                       needle core biopsy performed.                       - Multiple small 5-8 mm liver masses was found in the                       visualized portion of the liver.Recommendation:        - Await path results.                       - The findings and recommendations were discussed with                       the patient.                       - Likely sarcoid related lymph nodes and liver                       involvement                       - Contrast IV Egan or MRI depending on patients                       preference and history o IV allergies                       Call Dr Ruthanne Covey office [phone] for results and                       scheduling                                     She called to find out how to schedule the imaging.  She prefers to have the [place] scan.  Order is in and DI scheduling # given.Dasie Epps RN

## 2023-09-27 ENCOUNTER — Encounter: Admit: 2023-09-27 | Payer: PRIVATE HEALTH INSURANCE | Attending: Internal Medicine | Primary: Internal Medicine

## 2023-09-27 DIAGNOSIS — K200 Eosinophilic esophagitis: Secondary | ICD-10-CM

## 2023-09-27 DIAGNOSIS — Z87828 Personal history of other (healed) physical injury and trauma: Secondary | ICD-10-CM

## 2023-09-27 DIAGNOSIS — I340 Nonrheumatic mitral (valve) insufficiency: Secondary | ICD-10-CM

## 2023-09-27 DIAGNOSIS — I341 Nonrheumatic mitral (valve) prolapse: Secondary | ICD-10-CM

## 2023-09-27 DIAGNOSIS — N189 Chronic kidney disease, unspecified: Secondary | ICD-10-CM

## 2023-09-27 DIAGNOSIS — Z5189 Encounter for other specified aftercare: Secondary | ICD-10-CM

## 2023-09-27 DIAGNOSIS — K219 Gastro-esophageal reflux disease without esophagitis: Secondary | ICD-10-CM

## 2023-09-27 DIAGNOSIS — I10 Essential (primary) hypertension: Secondary | ICD-10-CM

## 2023-09-27 DIAGNOSIS — Q613 Polycystic kidney, unspecified: Secondary | ICD-10-CM

## 2023-09-27 DIAGNOSIS — E559 Vitamin D deficiency, unspecified: Secondary | ICD-10-CM

## 2023-09-27 DIAGNOSIS — D573 Sickle-cell trait: Secondary | ICD-10-CM

## 2023-09-27 DIAGNOSIS — R002 Palpitations: Secondary | ICD-10-CM

## 2023-09-27 DIAGNOSIS — E119 Type 2 diabetes mellitus without complications: Secondary | ICD-10-CM

## 2023-09-27 NOTE — Other
 No evidence of cancer or lymphomaThe biopsy support the diagnosis of known sarcoidosis, benignPlanF/u with Dr Felicity Horseman MRI and MRCP with contrast to look at the liver lesions which are likely sarcoidosis

## 2023-09-27 NOTE — Anesthesia Pre-Procedure Evaluation
 This is a 58 y.o. female scheduled for UPPER EUS/FNA.Review of Systems/ Medical History Patient summary, nursing notes, Labs, pre-procedure vitals, height, weight and NPO status reviewed.Anesthesia Evaluation:   Estimated body mass index.09/23/23 : 28.87 kg/m? Last patient weight recorded. 09/23/23 : 76.3 kg Last patient height recorded. 09/15/23 : 5' 4 (1.626 m) CC/HPI: .58yo female with PMH  HTN, Mitral Valve regurgitation, Pulmnary Sarcoidosis, palpitations, PKD,  kidney and liver cysts, CKD, GERD, hx of eosinophilic esophagitis, Sickle Cell Trait,  to assess for peripancreatic fluid collection  peripancreatic fluid collectionPast Surgical History:  Past Surgical History:1984: APPENDECTOMYNo date: BRONCHOSCOPY    Comment:  w/ EBUSNo date: COLONOSCOPYNo date: DILATION AND CURETTAGE OF UTERUS04/23/2019: HYSTERECTOMYNo date: LAPAROSCOPYNo date: LEEPNo date: TONSILLECTOMYNo date: TUBAL LIGATION    Comment:  prior to the hysterectomyNo date: UPPER GASTROINTESTINAL ENDOSCOPYPAH 11/2021: LMA Type: i-gel; Size: 4PAH 2019:: endotracheal tube; Location: oral; Size(mm): 7; Insertion Attempts: 1; Cuffed?: Cuffed; Intubating Device: Mac; Blade Size: 3; VC Grade: IICardiovascular: Patient has a history of: hypertension and palpitations.   -Valvular disease: history of valvular problems/murmurs mitral regurgitation -Other Cardiovascular:   03/24/23: ECHOConclusion * Normal left ventricular size, systolic function and wall motion. Mild concentric left ventricular hypertrophy.  LVEF calculated by 3DE was 63%.  Normal global longitudinal strain (-21%).  Normal diastolic function and filling pressures.* Normal right ventricular cavity size and systolic function.  Estimated right ventricular systolic pressure is 26 mmHg.* Mild mitral regurgitation.* Mild tricuspid regurgitation.* Normal sinuses of Valsalva with a diameter of 2.9 cm and normal ascending aorta with a diameter of 3.0 cm.* IVC diameter < 2.1 cm that collapses > 50% with a sniff suggests normal RAP (0-5 mmHg, mean 3 mmHg).* No significant pericardial effusion.Aaron Aas Respiratory: -Airway Infections:   Other airway infections noted:  (sarcoidosis).-Lung Disorders: Patient has pulmonary neoplasm (sarcoidosis).  -Comments: 07/03/23: [place] CHESTCT Chest wo IV Contrast High Resolution ([facility])ImpressionWorsening pleuropulmonary findings related to known sarcoidosis.Unchanged thoracic adenopathy.Gastrointestinal/Genitourinary: Gastrointestinal Disorders:  Patient has GERD.Renal Disorders:  Patient has polycystic kidney disease.Hematological/Lymphatic: -Anemia: Patient has sickle cell trait.  -Coagulopathy:  Patient has blood dyscrasia.Endocrine/Metabolic: -Diabetes mellitus:  Patient has diabetes mellitus type 2.Additional Findings: 08/10/23 13:37Sodium: 142Potassium: 4.6Chloride: 106CO2: 26Anion Gap: 10BUN: 9Creatinine: 0.61Physical ExamCardiovascular:    normal exam  Pulmonary:  normal exam  Airway:  Mallampati: IITM distance: >3 FBNeck ROM: fullMouth Opening: >3cmAnesthesia PlanASA 3 The primary anesthesia plan is  general. Perioperative Code Status confirmed: It is my understanding that the patient is currently designated as 'Full Code' and will remain so throughout the perioperative period.Anesthesia informed consent obtained. Consent obtained from: patientThe post operative pain plan is per surgeon management.Plan discussed with CRNA.Anesthesiologist's Pre Op NoteI personally evaluated and examined the patient prior to the intra-operative phase of care on the day of the procedure.Aaron Aas

## 2023-09-29 ENCOUNTER — Encounter: Admit: 2023-09-29 | Payer: PRIVATE HEALTH INSURANCE | Attending: Rheumatology | Primary: Internal Medicine

## 2023-09-29 VITALS — BP 125/88 | HR 89 | Temp 97.60000°F | Resp 18

## 2023-09-29 DIAGNOSIS — D869 Sarcoidosis, unspecified: Secondary | ICD-10-CM

## 2023-09-29 DIAGNOSIS — R748 Abnormal levels of other serum enzymes: Secondary | ICD-10-CM

## 2023-09-29 NOTE — Patient Instructions
 Schedule PET [place] of your heart [phone])

## 2023-09-29 NOTE — Progress Notes
 [place] RheumatologyDiagnosis: Undifferentiated Connective Tissue Disease /SarcoidSerologies: +ANA 1:2560 nuclear dots; negative Sm, RNP, SSA, SSB, dsDNA, LA not detected, B2GP, ACL Ab negative, CCP negative, RF negativeManifestations: alopecia, arthralgias, dry eyes, dry mouthClinical course: FNA 11/2021 of lymph node taken during bronchoscopy showed non-caseating granulomas inflammation; biopsy of portal lymph node 09/2023 showed non-necrotizing granulomasPast medications: hydroxychloroquine  200 mg BIDCurrent therapy: noneHPI from 08/2018:Cathy Evans presents for initial visit, never saw a rheumatologist in the past.Cathy Evans is a 58 y.o. female with PMHx notable for HTN, polycystic kidney disease, who presents for initial visit. She was referred by her gastroenterologist Dr. Malvina Searle for concern of positive ANA, found as part of evaluation for dysphagia. She has underwent EGD and colonoscopy fall 2019 but notes symptoms persist despite initiation of PPI. She reports fatigue, hair thinning, episode of oral ulcers, along with arthralgia and myalgia, pregnancy loss at 3 months and possible issue with her platelets in the past. 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 Pt encouraged to use MyChart to let us  know if she has any questions or concerns.Interval Change:--Seen 12/2018, we discussed how her presentation may be suggestive of an evolving UCTD for which we discussed starting HCQ (AEs discussed) to which she agreed. She will start HCQ 200 mg daily and schedule Ophthalmology visit with [facility]. She will return for followup in 3 months or earlier PRN.--Seen 05/22/2019, on HCQ 200 mg daily that was started after her last visit, noting overall improvement, particularly in her MSK symptoms. She still endorses sicca, hair thinning, and fatigue; she describes some mild hand symptoms that are suggestive of inflammatory arthritis. We discussed continuing to monitor on HCQ 200 mg daily--she is up to date on Ophthalmology evaluation and has VF testing next week. We will see her for followup in 3-4 months.  --Seen 08/28/2019, on HCQ 200 mg daily, noting some MSK symptoms that are seem more mechanical in etiology. She also continues to have hair thinning, dry mouth, and some dysphagia. Her last GI visit was in early 2020 after endoscopies in 2019; I encouraged her to followup with GI prior to considering barium swallow study. At this time, we discussed scheduling an in-person visit and will plan to repeat labs before her followup in 3-4 months. 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 --Seen 12/03/2021, for her first in-person visit with me. She was started on HCQ but returns today after she self-DCd one year prior. Recently, clinical course c/b parotitis and thoracic adenopathy, now undergoing [place] abdomen/pelvis and evaluation by Oncology. Mammogram and ultrasound notable for BI-RAD2. She also describes fatigue, sicca, dysphagia, cramping abdominal pain. At this time, we discussed awaiting possible biopsy for further elucidation of underlying process--we will touch base after workup for thoracic adenopathy. In the interim, we will repeat serologies. We will hold on resuming HCQ for now. --Seen 02/11/2022, with palpitations and going to see Cardiology next month--also recommended to schedule PET myocardial for sarcoid involvement. She continues to have some SOB and noted to have restriction on PFTs concerning for neuromuscular involvement so myositis panel was ordered. At this time, we discussed how it is unclear if her palpitations are related to sarcoid and we will await additional workup. We will also check muscle enzymes along with monitoring labs. In the interim, she was encouraged to restart HCQ, which she is not taking. --Seen 05/17/2022, noting ongoing  palpitations s/p Cardiology evaluation, noted to unlikely to have cardiac sarcoid given no evidence of conduction disease, arrhythmia, or syncope/heart failure symptoms. She last saw Pulmonary 04/2022, recommended for PFTs in one year, with no indication for treatment. She is having fatigue. She notes MSK symptoms in her left mid-back that is suggestive of mechanical etiologies, other joints are overall stable. She is also using topical treatment for rash.  At this time, she was encouraged to schedule followup with Dermatology to evaluate for active skin in the setting of sarcoid, as there is no indication for immunosuppression in the setting of pulmonary and cardiac systems. Her MSK symptoms seem more from mechanical etiologies (normal CK, aldolase, ESR 21, CRP <10) for which we will start trial of diclofenac topical. We will continue to monitor clinically--Seen 09/16/2022, noting SOB upon exertion, s/p repeat [place] chest 08/2022 that showed hilar adenopathy but no change in lung findings, Pulmonary team recommended treatment for post nasal drip, no systemic treatment for sarcoid. She continues to have low back pain despite Tylenol  PRN, cyclobenzaprine  PRN. She had worsened symptoms with PT and now referred to aquatherapy. Other joints okay. At this time, we discussed how her low back symptoms are most likely mechanical but will proceed with MRI lumbar spine to exclude sarcoid involvement--we will also repeat labs this summer. Otherwise, no indication for immunosuppression for her pulmonary and cardiac systems. --Seen 03/31/2023, noting ongoing back pain for which she will see Spine Surgery. She also has continued chest tightness despite PFTs showing normal DLCO and [place] imaging that did not show CAC. She has recently underwent MRI abdomen without contrast that demonstrated hepatic lesions most c/w benign cyst versus hamartoma, mild hepatic steatosis, no suspicious masses or adenopathy. At this time, we discussed how she fortunately has not needed immunosuppression for sarcoid as her back symptoms are in the setting of DDD. She was encouraged to schedule followup with Pulmonary. We will repeat monitoring labs before her next visit.  Today:She underwent RUQ U/S that showed peripancreatic mass, suggesting a peripancreatic/periportal lymph node. She underwent biopsy that showed findings c/w sarcoid. She is going to have [place] next month.She was seen in ED 07/2023 when she p/w right blurry vision that was painless. She was noted to have dry eyes. MRI brain was unremarkable. She continues to have SOB upon exertion. No coughing. She has stiffness when she has prolonged in activity.Review of Systems (negative except as noted; positive findings bold)Constitutional: weight changes, fatigue, malaise, fever, chills, sweatsSkin: rashes, photosensitivity, hives, easy bruisability, alopecia, scalp tenderness, skin nodules or psoriasis Eyes:  pain, redness, itching, visual blurring, dryness, foreign body sensationENT: tinnitus, hearing loss, sinus congestion, loss of smell, dry nose, bloody nose, jaw claudication, oral ulcers, loss of taste, dry mouth, hoarsenessCardiac: chest pain, palpitations, irregular heartbeat, exertional dyspneaVascular: Raynaud?s, chilblains, frostbite, venous stasis, thrombosisPulmonary: shortness of breath, cough, wheeze, hemoptysis, chest wall pain, pleuritic chest pain, current tobacco use, GI: difficulty swallowing, nausea, vomiting, GERD, ulcers, constipation, diarrhea, change in bowel habits, abdominal pain, liver diseaseGU: genital ulcers, dysuria, blood in urine, nocturia, infections, kidney stonesMusculoskeletal: morning stiffness, neck pain, back pain, joint pain, joint swelling, muscle aching or tenderness, muscle weakness, chest wall tightnessNeurological: numbness, tingling, headaches, fainting, dizziness, imbalance, memory loss, seizure, strokeHeme/Lymph: swollen or tender glands, anemiaPsychiatric:  anxiety, irritability, depression, sleep disturbancePast Medical History:Past Medical History: Diagnosis Date  Chronic kidney disease   PKD, but normal creatinine  Diabetes mellitus (HC Code)    Encounter for blood transfusion   per pt, no rxn  Eosinophilic esophagitis 08/26/2020  GERD (gastroesophageal reflux disease)   History of torn meniscus of right knee 02/2022  Hypertension   Mitral valve prolapse   Mitral valve regurgitation   Palpitations   Polycystic kidney disease   Sickle cell trait (HC Code)   [place]ater[place] testing 2021  Vitamin D deficiency  Past Surgical History: Procedure Laterality Date  APPENDECTOMY  1984  BRONCHOSCOPY    w/ EBUS  COLONOSCOPY    DILATION AND CURETTAGE OF UTERUS    HYSTERECTOMY  08/30/2017  LAPAROSCOPY    LEEP    TONSILLECTOMY    TUBAL LIGATION    prior to the hysterectomy  UPPER GASTROINTESTINAL ENDOSCOPY   Allergy:Allergies Allergen Reactions  Penicillins Hives   Hives Has patient had a PCN reaction causing immediate rash, facial/tongue/throat swelling, SOB or lightheadedness with hypotension: YESHas patient had a PCN reaction causing severe rash involving mucus membranes or skin necrosis: NOHas patient had a PCN reaction that required hospitalization NOHas patient had a PCN reaction occurring within the last 10 years: NOIf all of the above answers are NO, then may proceed with Cephalosporin use.  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 Accucheck test strips. No current facility-administered medications for this visit. Physical ExaminationBP 125/88  - Pulse 89  - Temp 97.6 ?F (36.4 ?C) (Oral)  - Resp 18  - LMP 08/30/2017 (Exact Date) Comment: hyst 08/30/2017 - SpO2 100% General: female patient in no acute distressHEENT: no scleral icterus; no conjunctival injections; +fullness over R>L parotid area with some fullness and tendernessNeck: supple; no cervical or supraclavicular lymphadenopathyCVS: regular rate and rhythm and S1, S2 Respiratory: grossly clear to auscultation bilaterallyAbdomen: non-distendedExtremities: no edemaMSK:upper and lower extremity joints are normal without synovitis, enthesitis, dactylitis, or limitation in ROMBack: no tenderness to palpation over vertebraeSkin: skin color, texture, turgor normal; fading rash over anterior chestNeuro: alert and oriented; motor strength symmetrical proximally and distally in upper and lower extremitiesPsychiatric: mood/affect appropriate; well-kempt Lab and Imaging ResultsLab Results Component Value Date  WBC 5.5 08/10/2023  HGB 12.7 08/10/2023  HCT 39.00 08/10/2023  MCV 86.1 08/10/2023  PLT 279 08/10/2023 Lab Results Component Value Date  CREATININE 0.61 08/10/2023  BUN 9 08/10/2023  NA 142 08/10/2023  K 4.6 08/10/2023  CL 106 08/10/2023  CO2 26 08/10/2023 Lab Results Component Value Date  ALT 54 (H) 08/25/2023  AST 53 (H) 08/25/2023  ALKPHOS 176 (H) 08/25/2023  BILITOT 0.5 08/25/2023  12/2019ANA	1:1280 nuclear, multiple nuclear dotsdsDNA	Negative Scl-70 	Negative SSA	NegativeSSB	NegativeJo-1 	NegativeCCP 	<16RF IgM 	10ANCA	NegativeSm Ab	NegativeMito Ab	Negative C3	161C4	4212/19/2019 [place] abdomen pelvisThe liver, gallbladder, spleen, pancreas, and adrenal glands are unremarkable, except for tiny hepatic cysts. There are multiple bilateral renal cysts and hypodensities too small to characterize, including a 2.0 cm lesion in the right kidney that likely represents a cyst with new hemorrhagic/proteinaceous debris (image 35, series 2). There is no bowel obstruction, ascites, or lymphadenopathy. No aggressive osseous lesions are seen.IMPRESSION: No evidence of metastatic disease.08/30/2022 [place] chestSlight increase in the size of mediastinal adenopathy compared to study from June 2023. No change in lung findings including a few perilymphatic nodules. No significant bronchiectasis or architectural distortion in the lungs. 06/09/2023 PFTsSpirometry is  suggestive of restriction. Severely reducced lung volumes, normal diffusing capacity. 07/03/2023 [place] chestWorsening pleuropulmonary findings related to known sarcoidosis.Unchanged thoracic adenopathy.SummaryMerashe Evans is a 58 y.o. 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 She saw Pulmonary 07/2023, noted to have worsening pleuropulmonary findings on [place] as well as PFTs showing restriction, reduced lung volumes. Plan was to trial prednisone  but she never started. She also has been having elevated liver enzymes since 05/2023, s/p RUQ U/S which showed peripancreatic mass, suggesting a peripancreatic/periportal lymph node. She underwent EUS and biopsy of portal lymph node 09/2023 showed non-necrotizing granulomas. She continues to have SOB upon exertion s/p cardiac workup that has included Holter monitoring (PACs, PVCs, SVT) but declined additional imaging in the past. We discussed assessing for cardiac involvement with PET [place] sarcoid to which she is now agreeable. We will then discuss with Pulmonary, GI, and Cardiology next steps as she will likely need to start immunosuppression. RecommendationsUCTDSarcoidosis (biopsy proven with involvement of lymph nodes, ?skin)Labs/imaging to monitor disease activity and medications: Orders Placed This Encounter Procedures  C-reactive protein (CRP)  Sedimentation rate (ESR)  Hepatitis B core antibody, IgM  Hepatitis B surface antigen     ([facility])  Hepatitis C Ab with reflex to HCV PCR  Hepatitis B surface antibody     ([facility])  ANGIOTENSIN CONVERTING ENZYME  QuantiFERON-TB  GAMMA GT  CK     ([facility])  Immunoglobulin G subclass 4     ([facility] LMW Q [facility])  Immunoglobulins IgG, IgA, IgM  PET [place] Myocardial Sarcoid Evaluation (Rest Only) ([facility])  CBC and differential  Comprehensive metabolic panel Patient education/counseling RE: UCTD, sarcoidFollow up: The patient knows to call with any concerns before the next appointment. Next visit: 3-4 monthsOn the day of this patient's encounter, a total of 42 minutes was personally spent by me.  This does not include any resident/fellow teaching time, or any time spent performing a procedural service.Toni Frank, MDYale Rheumatology Jana [email or web address]/[email or web address]

## 2023-10-11 ENCOUNTER — Inpatient Hospital Stay: Admit: 2023-10-11 | Discharge: 2023-10-11 | Payer: Medicare (Managed Care) | Primary: Internal Medicine

## 2023-10-11 DIAGNOSIS — D869 Sarcoidosis, unspecified: Secondary | ICD-10-CM

## 2023-10-11 LAB — CBC WITH AUTO DIFFERENTIAL
BKR WAM ABSOLUTE IMMATURE GRANULOCYTES.: 0.01 x 1000/ÂµL (ref 0.00–0.30)
BKR WAM ABSOLUTE LYMPHOCYTE COUNT.: 1.68 x 1000/ÂµL (ref 0.60–3.70)
BKR WAM ABSOLUTE NRBC: 0 x 1000/ÂµL (ref 0.00–1.00)
BKR WAM ANC (ABSOLUTE NEUTROPHIL COUNT): 2.44 x 1000/ÂµL (ref 2.00–7.60)
BKR WAM BASOPHIL ABSOLUTE COUNT.: 0.06 x 1000/ÂµL (ref 0.00–1.00)
BKR WAM BASOPHILS: 1.1 % (ref 0.0–1.4)
BKR WAM EOSINOPHIL ABSOLUTE COUNT.: 0.75 x 1000/ÂµL (ref 0.00–1.00)
BKR WAM EOSINOPHILS: 13.8 % — ABNORMAL HIGH (ref 0.0–5.0)
BKR WAM HEMATOCRIT: 39 % (ref 35.00–45.00)
BKR WAM HEMOGLOBIN: 12.8 g/dL (ref 11.7–15.5)
BKR WAM IMMATURE GRANULOCYTES: 0.2 % (ref 0.0–1.0)
BKR WAM LYMPHOCYTES: 30.9 % (ref 17.0–50.0)
BKR WAM MCH: 28.3 pg (ref 27.0–33.0)
BKR WAM MCHC: 32.8 g/dL (ref 31.0–36.0)
BKR WAM MCV: 86.3 fL (ref 80.0–100.0)
BKR WAM MONOCYTE ABSOLUTE COUNT.: 0.5 x 1000/ÂµL (ref 0.00–1.00)
BKR WAM MONOCYTES: 9.2 % (ref 4.0–12.0)
BKR WAM MPV: 9.6 fL (ref 8.0–12.0)
BKR WAM NEUTROPHILS: 44.8 % (ref 39.0–72.0)
BKR WAM NUCLEATED RED BLOOD CELLS: 0 % (ref 0.0–1.0)
BKR WAM PLATELETS: 264 x1000/ÂµL (ref 150–420)
BKR WAM RDW-CV: 14.1 % (ref 11.0–15.0)
BKR WAM RED BLOOD CELL COUNT.: 4.52 M/ÂµL (ref 4.00–6.00)
BKR WAM WHITE BLOOD CELL COUNT: 5.4 x1000/ÂµL (ref 4.0–11.0)

## 2023-10-11 LAB — HEPATITIS B CORE ANTIBODY, IGM: BKR HEPATITIS B CORE IGM ANTIBODY: NEGATIVE

## 2023-10-11 LAB — IMMUNOGLOBULINS IGG, IGA, IGM
BKR IGA: 592 mg/dL — ABNORMAL HIGH (ref 70–470)
BKR IGG: 1648 mg/dL — ABNORMAL HIGH (ref 700–1600)
BKR IGM: 140 mg/dL (ref 40–230)

## 2023-10-11 LAB — COMPREHENSIVE METABOLIC PANEL
BKR A/G RATIO: 1.2 (ref 1.0–2.2)
BKR ALANINE AMINOTRANSFERASE (ALT): 43 U/L — ABNORMAL HIGH (ref 10–35)
BKR ALBUMIN: 4.1 g/dL (ref 3.6–5.1)
BKR ALKALINE PHOSPHATASE: 167 U/L — ABNORMAL HIGH (ref 9–122)
BKR ANION GAP: 12 (ref 7–17)
BKR ASPARTATE AMINOTRANSFERASE (AST): 47 U/L — ABNORMAL HIGH (ref 10–35)
BKR AST/ALT RATIO: 1.1
BKR BILIRUBIN TOTAL: 0.4 mg/dL (ref <=1.2)
BKR BLOOD UREA NITROGEN: 7 mg/dL (ref 6–20)
BKR BUN / CREAT RATIO: 13.2 (ref 8.0–23.0)
BKR CALCIUM: 9.6 mg/dL (ref 8.8–10.2)
BKR CHLORIDE: 104 mmol/L (ref 98–107)
BKR CO2: 24 mmol/L (ref 20–30)
BKR CREATININE DELTA: -0.08
BKR CREATININE: 0.53 mg/dL (ref 0.40–1.30)
BKR EGFR, CREATININE (CKD-EPI 2021): >60 mL/min/{1.73_m2} (ref >=60)
BKR GLOBULIN: 3.5 g/dL (ref 2.0–3.9)
BKR GLUCOSE: 95 mg/dL (ref 70–100)
BKR POTASSIUM: 4.5 mmol/L (ref 3.3–5.3)
BKR PROTEIN TOTAL: 7.6 g/dL (ref 5.9–8.3)
BKR SODIUM: 140 mmol/L (ref 136–144)

## 2023-10-11 LAB — GAMMA GT: BKR GAMMA GLUTAMYL TRANSFERASE: 149 U/L — ABNORMAL HIGH (ref <48)

## 2023-10-11 LAB — SEDIMENTATION RATE (ESR): BKR SEDIMENTATION RATE, ERYTHROCYTE: 61 mm/h — ABNORMAL HIGH (ref 0–20)

## 2023-10-11 LAB — HEPATITIS C AB WITH REFLEX TO HCV PCR: BKR HEPATITIS C ANTIBODY: NEGATIVE

## 2023-10-11 LAB — HEPATITIS B SURFACE ANTIGEN     (BH GH L LMW YH): BKR HEPATITIS B SURFACE ANTIGEN: NEGATIVE

## 2023-10-11 LAB — HEPATITIS B SURFACE ANTIBODY     (BH GH L LMW YH): BKR HEP B SURFACE AB INITIAL RESULT: 11.11 m[IU]/mL (ref >=12)

## 2023-10-11 LAB — C-REACTIVE PROTEIN     (CRP): BKR C-REACTIVE PROTEIN, HIGH SENSITIVITY: 13.2 mg/L — ABNORMAL HIGH

## 2023-10-11 LAB — ANGIOTENSIN CONVERTING ENZYME: BKR ANGIOTENSIN CONVERTING ENZYME: 109 U/L — ABNORMAL HIGH (ref 12–82)

## 2023-10-11 LAB — CK     (BH GH L LMW YH): BKR CREATINE KINASE TOTAL: 54 U/L (ref 26–192)

## 2023-10-12 LAB — QUANTIFERON-TB
BKR QUANTIFERON-TB GOLD IN-TUBE: NEGATIVE
BKR QUANTIFERON-TB MITOGEN MINUS NIL: 2.29 [IU]/mL
BKR QUANTIFERON-TB NIL: 0.09 [IU]/mL
BKR QUANTIFERON-TB1 MINUS NIL: 0 [IU]/mL (ref <0.35)
BKR QUANTIFERON-TB2 MINUS NIL: 0.01 [IU]/mL (ref <0.35)

## 2023-10-14 ENCOUNTER — Encounter: Admit: 2023-10-14 | Payer: PRIVATE HEALTH INSURANCE | Attending: Rheumatology | Primary: Internal Medicine

## 2023-10-14 ENCOUNTER — Inpatient Hospital Stay: Admit: 2023-10-14 | Discharge: 2023-10-14 | Payer: PRIVATE HEALTH INSURANCE | Primary: Internal Medicine

## 2023-10-14 ENCOUNTER — Telehealth: Admit: 2023-10-14 | Payer: PRIVATE HEALTH INSURANCE | Attending: Rheumatology | Primary: Internal Medicine

## 2023-10-14 DIAGNOSIS — K769 Liver disease, unspecified: Secondary | ICD-10-CM

## 2023-10-14 LAB — IMMUNOGLOBULIN G SUBCLASS 4     (BH GH LMW Q YH): BKR IMMUNOGLOBULIN G SUBCLASS 4: 42.7 mg/dL (ref 3.9–86.4)

## 2023-10-14 MED ORDER — IOHEXOL 350 MG IODINE/ML INTRAVENOUS SOLUTION
350 | Freq: Once | INTRAVENOUS | Status: CP | PRN
Start: 2023-10-14 — End: ?
  Administered 2023-10-14: 08:00:00 350 mL via INTRAVENOUS

## 2023-10-14 MED ORDER — SODIUM CHLORIDE 0.9 % LARGE VOLUME SYRINGE FOR AUTOINJECTOR
Freq: Once | INTRAVENOUS | Status: CP | PRN
Start: 2023-10-14 — End: ?
  Administered 2023-10-14: 08:00:00 via INTRAVENOUS

## 2023-10-14 NOTE — Other
 ACE 109IgG4 42.7CRP 13.2GGT 149IgG 1648IgA 592Alk phos 167ALT 43AST 47ESR 61CK 54

## 2023-10-14 NOTE — Telephone Encounter
 Spoke to [place] over the phone, discussed how results are c/w more active inflammation and we will await [place] heart that she has scheduled later this month, then decide on treatment plan.

## 2023-10-14 NOTE — Telephone Encounter
 Copied from CRM #[id number]. Topic: [facility] General CRM - General Inquiry>> Oct 14, 2023  1:59 PM [place] F wrote:Cathy Evans, Cathy Evans called regarding Results.Patient called requesting a call back for results fromHer [place] Scan and BWPatient can be reached @[phone]

## 2023-10-17 ENCOUNTER — Telehealth: Admit: 2023-10-17 | Payer: PRIVATE HEALTH INSURANCE | Attending: Internal Medicine | Primary: Internal Medicine

## 2023-10-17 NOTE — Telephone Encounter
 Patient had a [place] scan on 10/14/23 and want to know if the results are in

## 2023-10-24 ENCOUNTER — Encounter: Admit: 2023-10-24 | Payer: PRIVATE HEALTH INSURANCE | Attending: Internal Medicine | Primary: Internal Medicine

## 2023-10-25 ENCOUNTER — Inpatient Hospital Stay: Admit: 2023-10-25 | Discharge: 2023-10-25 | Payer: Medicare (Managed Care) | Primary: Internal Medicine

## 2023-10-25 DIAGNOSIS — R590 Localized enlarged lymph nodes: Secondary | ICD-10-CM

## 2023-10-26 ENCOUNTER — Encounter: Admit: 2023-10-26 | Payer: PRIVATE HEALTH INSURANCE | Attending: Internal Medicine | Primary: Internal Medicine

## 2023-10-26 NOTE — Other
 Abdominal imaging features are consistent with liver and lymph node involvement with sarcoidosisPlanPatient to followup with Dr Hsiao (cc'd) to ask if there is a need to biopsy the liver to confirm liver involvement

## 2023-11-07 ENCOUNTER — Encounter: Admit: 2023-11-07 | Payer: PRIVATE HEALTH INSURANCE | Attending: Rheumatology | Primary: Internal Medicine

## 2023-11-07 ENCOUNTER — Inpatient Hospital Stay: Admit: 2023-11-07 | Discharge: 2023-11-07 | Payer: PRIVATE HEALTH INSURANCE | Primary: Internal Medicine

## 2023-11-07 ENCOUNTER — Inpatient Hospital Stay: Admit: 2023-11-07 | Discharge: 2023-11-07 | Payer: Medicare (Managed Care) | Primary: Internal Medicine

## 2023-11-07 DIAGNOSIS — D869 Sarcoidosis, unspecified: Principal | ICD-10-CM

## 2023-11-07 MED ORDER — RUBIDIUM RB-82, DIAGNOSTIC
Freq: Once | INTRAVENOUS | Status: CP | PRN
Start: 2023-11-07 — End: ?
  Administered 2023-11-07: 08:00:00 via INTRAVENOUS

## 2023-11-07 MED ORDER — FLUDEOXYGLUCOSE P/DOSE (FDG) INJECTION
Freq: Once | INTRAVENOUS | Status: CP
Start: 2023-11-07 — End: ?
  Administered 2023-11-07: 09:00:00 via INTRAVENOUS

## 2023-11-07 NOTE — Other
 Results reviewed

## 2023-11-10 ENCOUNTER — Encounter
Admit: 2023-11-10 | Payer: PRIVATE HEALTH INSURANCE | Attending: Student in an Organized Health Care Education/Training Program | Primary: Internal Medicine

## 2023-11-25 ENCOUNTER — Ambulatory Visit: Admit: 2023-11-25 | Payer: PRIVATE HEALTH INSURANCE | Primary: Internal Medicine

## 2023-12-05 ENCOUNTER — Telehealth: Admit: 2023-12-05 | Payer: PRIVATE HEALTH INSURANCE | Attending: Internal Medicine | Primary: Internal Medicine

## 2023-12-05 NOTE — Telephone Encounter
 Pt had procedure with Dr. Librada on 6/16 and [place] scan done on 6/30. Pt states that she has not heard from anyone regarding the [place] scan. She would like a call to go over next steps. Please advise.

## 2023-12-05 NOTE — Telephone Encounter
 TC-returned patient's call today regarding June 2025 [place] scan resultsJames JINNY Greet, MD6/18/2025 10:36 AM EDT   Abdominal imaging features are consistent with liver and lymph node involvement with sarcoidosis Plan Patient to followup with Dr Hsiao (cc'd) to ask if there is a need to biopsy the liver to confirm liver involvement I let patient know I will send message to Dr Greet and Dr Hsiao to confirm next steps. Pt appreciates call back verbalizes her understanding.Please call or message with any bobette Valentin Keel, RN, MSNNurse CoordinatorAdvanced Endoscopy & Pancreatic [facility] HealthOffice Phone: [phone]: [phone]

## 2023-12-13 ENCOUNTER — Encounter: Admit: 2023-12-13 | Payer: PRIVATE HEALTH INSURANCE | Attending: Internal Medicine | Primary: Internal Medicine

## 2023-12-13 DIAGNOSIS — D869 Sarcoidosis, unspecified: Principal | ICD-10-CM

## 2023-12-15 ENCOUNTER — Encounter: Admit: 2023-12-15 | Payer: PRIVATE HEALTH INSURANCE | Primary: Internal Medicine

## 2023-12-16 ENCOUNTER — Ambulatory Visit: Admit: 2023-12-16 | Payer: PRIVATE HEALTH INSURANCE | Primary: Internal Medicine

## 2023-12-30 ENCOUNTER — Ambulatory Visit: Admit: 2023-12-30 | Payer: PRIVATE HEALTH INSURANCE | Attending: Gastroenterology | Primary: Internal Medicine

## 2023-12-30 ENCOUNTER — Encounter: Admit: 2023-12-30 | Payer: PRIVATE HEALTH INSURANCE | Attending: Gastroenterology | Primary: Internal Medicine

## 2023-12-30 ENCOUNTER — Inpatient Hospital Stay: Admit: 2023-12-30 | Discharge: 2023-12-30 | Payer: Medicare (Managed Care) | Primary: Internal Medicine

## 2023-12-30 VITALS — BP 148/81 | HR 83 | Temp 98.10000°F | Wt 168.0 lb

## 2023-12-30 DIAGNOSIS — Z5189 Encounter for other specified aftercare: Secondary | ICD-10-CM

## 2023-12-30 DIAGNOSIS — Q613 Polycystic kidney, unspecified: Secondary | ICD-10-CM

## 2023-12-30 DIAGNOSIS — E559 Vitamin D deficiency, unspecified: Secondary | ICD-10-CM

## 2023-12-30 DIAGNOSIS — Z87828 Personal history of other (healed) physical injury and trauma: Secondary | ICD-10-CM

## 2023-12-30 DIAGNOSIS — R748 Abnormal levels of other serum enzymes: Principal | ICD-10-CM

## 2023-12-30 DIAGNOSIS — I341 Nonrheumatic mitral (valve) prolapse: Secondary | ICD-10-CM

## 2023-12-30 DIAGNOSIS — N189 Chronic kidney disease, unspecified: Principal | ICD-10-CM

## 2023-12-30 DIAGNOSIS — I10 Essential (primary) hypertension: Secondary | ICD-10-CM

## 2023-12-30 DIAGNOSIS — E119 Type 2 diabetes mellitus without complications: Secondary | ICD-10-CM

## 2023-12-30 DIAGNOSIS — I340 Nonrheumatic mitral (valve) insufficiency: Secondary | ICD-10-CM

## 2023-12-30 DIAGNOSIS — D573 Sickle-cell trait: Secondary | ICD-10-CM

## 2023-12-30 DIAGNOSIS — K200 Eosinophilic esophagitis: Secondary | ICD-10-CM

## 2023-12-30 DIAGNOSIS — K219 Gastro-esophageal reflux disease without esophagitis: Secondary | ICD-10-CM

## 2023-12-30 DIAGNOSIS — R002 Palpitations: Secondary | ICD-10-CM

## 2023-12-30 LAB — IMMUNOGLOBULINS IGG, IGA, IGM
BKR IGA: 608 mg/dL — ABNORMAL HIGH (ref 70–470)
BKR IGG: 1731 mg/dL — ABNORMAL HIGH (ref 700–1600)
BKR IGM: 143 mg/dL (ref 40–230)

## 2023-12-30 LAB — LIPID PANEL
BKR CHOLESTEROL/HDL RATIO: 4.4 (ref 0.0–5.0)
BKR CHOLESTEROL: 180 mg/dL
BKR HDL CHOLESTEROL: 41 mg/dL (ref >=40)
BKR LDL CHOLESTEROL SAMPSON CALCULATED: 126 mg/dL — ABNORMAL HIGH
BKR TRIGLYCERIDES: 70 mg/dL

## 2023-12-30 LAB — HEPATIC FUNCTION PANEL
BKR A/G RATIO: 1 (ref 1.0–2.2)
BKR ALANINE AMINOTRANSFERASE (ALT): 34 U/L (ref 10–35)
BKR ALBUMIN: 4 g/dL (ref 3.6–5.1)
BKR ALKALINE PHOSPHATASE: 172 U/L — ABNORMAL HIGH (ref 9–122)
BKR ASPARTATE AMINOTRANSFERASE (AST): 42 U/L — ABNORMAL HIGH (ref 10–35)
BKR AST/ALT RATIO: 1.2
BKR BILIRUBIN DIRECT: 0.2 mg/dL (ref <=0.2)
BKR BILIRUBIN TOTAL: 0.5 mg/dL (ref <=1.2)
BKR GLOBULIN: 4.2 g/dL — ABNORMAL HIGH (ref 2.0–3.9)
BKR PROTEIN TOTAL: 8.2 g/dL (ref 5.9–8.3)

## 2023-12-30 LAB — ANGIOTENSIN CONVERTING ENZYME: BKR ANGIOTENSIN CONVERTING ENZYME: 129 U/L — ABNORMAL HIGH (ref 12–82)

## 2023-12-30 LAB — BILE ACIDS, TOTAL: BKR BILE ACIDS, TOTAL: 19 umol/L — ABNORMAL HIGH (ref <=10)

## 2023-12-30 NOTE — Progress Notes
 [facility] HepatologyDavid Henderson, MD Ophelia Reese, MD Marina  Madelynn, MD Francisca Dessert, MD Derrek Dunnings, MD Bette Squire, MD Delon Iba, MD Sinclair Mcardle, MD Achille Jetty, MD Lynwood Poli, MD Elna Pacas, MD Roma Fisherman, MD Rodgers Mau, MD Janetta Harding, MD Auston Hug, MD Clem Has, MD Eather Human, MD Mickel Lipps, APRN Sherline Miners, MD Ozell Bloodgood, MD [facility] APRN Ephriam Bring, MD Ozell Bottom, MD Deland Sor, PA-C    Patient Name: Cathy Evans                DOB: 07-Jun-1965              MRN: [id number]   8/22/2025Subjective:    Cathy Evans is a 58 y.o. with a history of hypertension, prediabetes, elevated BMI and sarcoidosis who has abnormal LFTs.The patient is seen today 12/30/2023 as a new patient for abnormal LFTs of a mixed cholestatic pattern in a background history of sarcoidosis, type 2 diabetes, HTN.  She shares that the sarcoidosis was diagnosed approximately a year or more ago.  She reports occasional shortness of breath with activity otherwise no baseline dyspnea.  There have been no complaints of cough, nasal congestion, lymphadenopathy or fever.  That said she does have occasional night sweats and hot flashes.Notably there have been no liver specific symptoms as she denies jaundice, scleral icterus pruritus, right upper quadrant pain, acholic stool or dark urine.  She does note occasional epigastric bloating.  Recent endoscopic ultrasound in May 2025 pursuant to tissue sample of enlarged periportal node, showed small non-necrotizing granulomata.  There were no other endoscopic abnormalities.  Cross-sectional imaging has revealed periportal and perigastric adenopathy.  Additionally on [place] scan from 2024 and 2025 there are demonstrable subcentimeter lesions, originally deemed hamartomas.  In my review these lesions may represent features of sarcoidosis, differential diagnosis congenital hepatic hamartomas or cysts.  There are no features of liver capsule lobularity or hypertrophy of the caudate lobe or varices.  [place] scan of the chest as recent as February 2025 revealed pleural pulmonary features of sarcoidosis.She has an elevated ACE level.  Whereas LFTs were normal in early 2024, the latest liver function tests from 2025 show elevations of AST, ALT, alkaline phosphatase and GGT.  The values of total bilirubin, protein and albumin were normal.  Viral serologies were negative.  Autoimmune markers showed a borderline IgG of 1600 and elevated IgA. The patient does not drink.  There is no history of blood transfusions.  Hepatitis serologies are negative.Review of symptoms is notable for recent fatigue, insomnia, joint aches principally of her knees and spine.She has intentionally lost some weight.  She apparently was considered for possible GLP 1 therapy but she stated she was not interested in taking additional treatment, preferring instead to diet.Past History (PMH, PSH):   Past Medical History[1]Past Surgical History[2]Social Connections: Not on file Tobacco noneAlcohol 1 drink per monthFamily history no sarcoidosis, no TBMaternal aunt with cirrhosis from hepatitis complicated by hepatomaReview of Systems Constitutional:  Negative for fever. Eyes: Negative.  Respiratory:  Positive for shortness of breath.  Cardiovascular: Negative.  Gastrointestinal:  Negative for blood in stool, heartburn and nausea. Genitourinary: Negative.  Musculoskeletal:  Positive for back pain and joint pain. Skin:  Negative for itching. Neurological: Negative.  Psychiatric/Behavioral: Negative.    Medications:   Current Medications[3] Objective:   Vitals: Pulse 83  - Temp 98.1 ?F (36.7 ?C)  - Wt 76.2 kg  - LMP 08/30/2017 (Exact Date) Comment: hyst 08/30/2017 - SpO2 99%  - BMI 28.84  kg/m? Vitals:  12/30/23 0810 BP: (!) 148/81 Pulse: 83 Temp: 98.1 ?F (36.7 ?C) Last 3 Weights  12/30/23 0810 Weight (lbs): 168 Lbs. Wt Readings from Last 3 Encounters: 12/30/23 76.2 kg 10/24/23 75.8 kg 09/23/23 76.3 kg Physical ExamConstitutional:     Comments: Well-appearing HENT:    Mouth/Throat:    Pharynx: No oropharyngeal exudate. Eyes:    General: No scleral icterus.   Conjunctiva/sclera: Conjunctivae normal. Cardiovascular:    Rate and Rhythm: Normal rate and regular rhythm.    Pulses: Normal pulses.    Heart sounds: Normal heart sounds. Pulmonary:    Effort: Pulmonary effort is normal.    Breath sounds: Normal breath sounds. Abdominal:    General: Bowel sounds are normal. There is no distension.    Palpations: Abdomen is soft.    Tenderness: There is no abdominal tenderness.    Comments: Normal-size and texture of the liver, nontender Musculoskeletal:    Right lower leg: No edema.    Left lower leg: No edema. Lymphadenopathy:    Cervical: No cervical adenopathy. Skin:   General: Skin is warm and dry. Neurological:    General: No focal deficit present.    Mental Status: She is oriented to person, place, and time. Psychiatric:       Mood and Affect: Mood normal.       Thought Content: Thought content normal.  Laboratory results:    Lab Results Component Value Date  WBC 5.4 10/11/2023  HGB 12.8 10/11/2023  HCT 39.00 10/11/2023  MCV 86.3 10/11/2023  PLT 264 10/11/2023 Lab Results Component Value Date  CREATININE 0.53 10/11/2023  BUN 7 10/11/2023  NA 140 10/11/2023  K 4.5 10/11/2023  CL 104 10/11/2023  CO2 24 10/11/2023 Lab Results Component Value Date  ALT 43 (H) 10/11/2023  AST 47 (H) 10/11/2023  GGT 149 (H) 10/11/2023  ALKPHOS 167 (H) 10/11/2023  BILITOT 0.4 10/11/2023 Computed MELD 3.0 unavailable. One or more values for this score either were not found within the given timeframe or did not fit some other criterion.Computed MELD-Na unavailable. One or more values for this score either were not found within the given timeframe or did not fit some other criterion.  Latest Reference Range & Units 10/01/22 08:45 02/08/23 10:34 05/14/23 10:51 05/21/23 10:08 08/10/23 13:37 08/25/23 09:53 10/11/23 11:40 Total Bilirubin <=1.2 mg/dL 0.2 0.4 0.4 0.4 0.4 0.5 0.4 Bilirubin, Direct <=0.2 mg/dL      0.1  Alkaline Phosphatase 9 - 122 U/L 102 121 147 (H) 135 (H) 153 (H) 176 (H) 167 (H) Alanine Aminotransferase (ALT) 10 - 35 U/L 23 23 41 (H) 35 48 (H) 54 (H) 43 (H) Aspartate Aminotransferase (AST) 10 - 35 U/L 26 36 (H) 34 35 47 (H) 53 (H) 47 (H) AST/ALT Ratio Reference Range Not Established  1.1 1.6 0.8 1.0 1.0 1.0 1.1 GGT <48 U/L       149 (H) Total Protein 5.9 - 8.3 g/dL 7.5 7.8 7.6 7.5 7.4 8.0 7.6 Albumin 3.6 - 5.1 g/dL 4.0 4.2 4.0 4.0 4.1 4.1 4.1  Latest Reference Range & Units 10/27/21 11:49 04/20/22 15:52 05/21/23 10:26 10/11/23 11:40 Immunoglobulin A 70 - 470 mg/dL 543   407 (H) IgM 40 - 230 mg/dL 881   859 Immunoglobulin G 700 - 1,600 mg/dL 8,374 (H)   8,351 (H) Immunofixation Electrophoresis Gel  Normal immunofixation electrophoresis. No evidence of a serum monoclonal component.    IgG Subclass 4 3.9 - 86.4 mg/dL 66.4   57.2 Creatinine, Urine, Random Reference Range Not  Established mg/dL   62  C3 Complement 90 - 180 mg/dL 829 835   C4 Complement 10 - 40 mg/dL 38 38   Rheumatoid Factor <14.0 IU/mL <10.0    ANA <1:80 (Negative)  Positive !    ANA Titer 1 <1:80 (Negative)  >=1:2560 !    ANA Pattern 1  Nuclear Dots    ANA Titer 2 <1:80 (Negative)  1:160 !    ANA Pattern 2  Speckled     Latest Reference Range & Units 10/11/23 11:40 Hepatitis B Surface Antigen Negative  Negative Hepatitis B Surface Antibody > or = 12 mIU/mL 11.11 Hepatitis B Core IgM Antibody  Negative Hepatitis C Antibody Negative  Negative  Latest Reference Range & Units 10/27/21 11:49 10/01/22 08:45 10/11/23 11:40 Angiotensin Converting Enzyme 12 - 82 U/L 60 66 109 (H) (H): Data is abnormally highGI procedures:   GI procedures/pathologyEUS  MAY 2025LARGE 4CM PORTAL LYMPH NODE:       - SMALL FRAGMENTS OF FIBROTIC TISSUE WITH SEVERAL SMALL NON-NECROTIZING GRANULOMAS. COLON 2019Normal colonic mucosa from the terminal ileum to the rectosigmoid.Protuberant appendiceal orifice..Normal overlying mucosa.No colon polyps elsewhere.No colitis.[place] abdomen pelvis 2019No evidence of any tumor     - FRAGMENTS OF BENIGN GASTRIC MUCOSA Diagnostic Imaging:    No results found.Rafael Hernandez Abdomen Pelvis w IV ContrastResult Date: 6/6/2025Redemonstration of innumerable subcentimeter lesions throughout the liver and spleen, increased in size and number, though comparison is difficult given differences in modalities. Stable periportal/gastrohepatic lymphadenopathy. New mild retroperitoneal lymphadenopathy. Reported and signed by: Ozell Lied, MD  [facility] Radiology and Biomedical Imaging PET myocardial sarcoid evaluation November 07, 2023  * 1) Normal fasting FDG PET with rest RB-82 myocardial perfusion imaging for the evaluation of cardiac sarcoidosis but with extensive non-cardiac FDG uptake (see below)* 2) There was no myocardial FDG uptake.  Perfusion imaging was normal.* 3) Global left ventricular systolic function was normal and normal regional wall motion.  The left ventricle was normal in size.* 4) [place] performed for attenuation correction showed no significant coronary artery calcifications.  Non-cardiac findings of the [place] are described in a separate report from Radiology.* 5) There is extensive FDG avid lymphadenopathy pericardial, para-aortic, bilateral hilar, subcarinal lymph nodes, as well as extensive nodular FDG abnormalities in the liver (SUVmax 24) and less so in the spleen. In the correct clinical context, these are consistent with active systemic sarcoidosisCT abdomen June 2025Redemonstration of innumerable subcentimeter lesions throughout the liver and spleen, increased in size and number, though comparison is difficult given differences in modalities.Stable periportal/gastrohepatic lymphadenopathy. New mild retroperitoneal lymphadenopathy. [place] ABDOMEN PELVIS W IV CONTRAST on 10/14/2023 7:30 AM INDICATION: liver lesions/lymph nodes. COMPARISON: MRI from 03/29/2023 TECHNIQUE: [place] images of the abdomen and pelvis were obtained after the administration of intravenous contrast. IV contrast administered:  80 ML IOHEXOL  (OMNIPAQUE ) 350 MG IODINE /ML INJECTION FINDINGS: Lung bases: Several sub-4 mm nonspecific nodules in both lung bases. Hepatobiliary: Redemonstration of innumerable subcentimeter lesions throughout the liver which appear increased in size and number, though comparison is difficult given differences in modalities. Pancreas: Unremarkable.  Spleen: Increased subcentimeter splenic hypodensities. Adrenal glands: Unremarkable. Kidneys: Multiple bilateral renal cysts and hypodensities too small to characterize. Punctate nonobstructing left renal stones. Left renal scarring. Bowel: No bowel obstruction. Lymph nodes: Stable periportal/gastrohepatic lymphadenopathy measuring up to 1.6 cm (image 20, series 2). New mild retroperitoneal lymphadenopathy. Peritoneum: No ascites. Vasculature: No abdominal aortic aneurysm. Pelvis: No mass. Osseous structures: No aggressive osseous lesion. IMPRESSION: Redemonstration of innumerable subcentimeter lesions throughout the liver and spleen,  increased in size and number, though comparison is difficult given differences in modalities. Stable periportal/gastrohepatic lymphadenopathy. New mild retroperitoneal lymphadenopathy. [place] abdomen pelvis 2019No evidence of any tumor     - FRAGMENTS OF BENIGN GASTRIC MUCOSA [place] abdomen June 2025Redemonstration of innumerable subcentimeter lesions throughout the liver and spleen, increased in size and number, though comparison is difficult given differences in modalities.Stable periportal/gastrohepatic lymphadenopathy. New mild retroperitoneal lymphadenopathy. MRI abdomen 2024Stable benign punctate hepatic cysts/biliary hamartomas. These do not require any routine imaging surveillance.Note:Patient declined intravenous contrast secondary to allergy from prior MRI however this is not documented in the medical record and is not mentioned in last MRi report. If the patient did have a true allergic like reaction to gadolinium based contrast agent, please on the update medical record with this information.[facility] Radiology Notify System:  Routine. ConclusionMRI ABDOMEN WO IV CONTRAST  PROVIDED INDICATION: liver massinnumerable liver nodules seen on [place] A/P; compare with prior MRI abdomen COMPARISON: MRI ABDOMEN W WO IV CONTRAST 2022-01-29; [place] RENAL STONE (HIGHLY LIKELY OR PRIOR STONE, LOW RADIATION) 2022-10-25 Technique: MRI of the abdomen was performed without IV contrast. Patient declined IV contrast use.  FINDINGS:Exams limited due to lack of IV contrast. Again noted are multiple small T2 hyperintense hepatic lesions which are most consistent with benign cysts or biliary hamartomas. Mild hepatic steatosis is noted. No suspicious liver masses seen on this limited noncontrast exam. Numerous renal cysts are again noted, better assessed on prior contrast-enhanced MRI, some of which are hemorrhagic. None kidney stones are better assessed on prior [place]. Spleen, adrenal glands, pancreas appear unremarkable. A small nonspecific nodule adjacent to the right adrenal gland is unchanged. No fluid collections or adenopathy. No aggressive osseous lesions. IMPRESSION: Stable benign punctate hepatic cysts/biliary hamartomas. These do not require any routine imaging surveillance. Note:Patient declined intravenous contrast secondary to allergy from prior MRI however this is not documented in the medical record and is not mentioned in last MRi report. If the patient did have a true allergic like reaction to gadolinium based contrast agent, please on the update medical record with this information.[place] abdomen pelvis 2018IMPRESSION: 1.  Attending of the gastric antrum and duodenal bulb, possibly reflecting gastritis/duodenitis. An infiltrative process /hypertrophy is also possible. Further evaluation with endoscopic visualization may be considered.2.  Numerous fluid-filled loops of small bowel throughout the abdomen. This is nonspecific, though may be seen in enteritis.3.  The appendix is not definitely visualized. However there are no inflammatory changes in the right lower quadrant to suggest appendicitis.4.  Enlarged  uterus measuring up to 13 cm likely a combination of fibroids and adenomyosis.5.  Numerous cysts in bilateral kidneys consistent with the given history of autosomal dominant polycystic kidney disease.Assessment / Plan :    Talea Manges is a 58 y.o. with a known history of sarcoidosis of the lungs and manifesting abdominal adenopathy principally periportal as documented histologically who has abnormal cholestatic LFTs.  There is an underlying history of prediabetes and elevated BMI as well as hypertension that may also be contributing to the aforementioned abnormal LFTs.  The cholestatic pattern is more typical of sarcoidosis.  Indeed on cross-sectional imaging there are hepatic lesions subcentimeter size that could be manifestations of sarcoidosis.  There is otherwise no biliary obstruction to explain the elevated alkaline phosphatase.  She has no clinical features of cholangitis or radiologic features of sclerosing cholangitis.Reviewed today with Graciella that she has asymptomatic cholestatic LFTs that are possibly consistent with sarcoidosis of the liver.  We reviewed the natural history of  this in the context of systemic disease that to date has been stable, notwithstanding occasional shortness of breath.  In review of her prior records from Pulmonary and Rheumatology there has been no immediate care plan for treatment.  The patient reiterated that she is reluctant to start a new medication without a clear indication for treatment.    Aside from the possibility of hepatic sarcoidosis there is also aside from the possibility of hepatic sarcoidosis there is steatotic liver disease S LD to consider in the differential diagnosis.  For further clarification we can obtain noninvasive testing such as liver fibro elastography to quantitate the percentile of steatosis if any and to rule out fibrosis.  Otherwise the distinction between hepatic sarcoidosis and S LD would be a histologic diagnosis, unless the FibroScan shows no evidence of steatosis.In other consideration is autoimmune hepatitis noting the ANA of 160, but however the IgG level is borderline.  The IgA otherwise is elevated and both these values could be nonspecific and not a manifestation of autoimmune hepatitis.RecommendAdditional lab workFibroScanContingent on results, consideration of Ursodiol  after further discussion with care teamPatient Active Problem List  Diagnosis Date Noted  Abnormal liver enzymes 12/30/2023  Type 2 diabetes mellitus without complication, without long-term current use of insulin  05/16/2023  Restrictive lung disease 05/02/2023  Sarcoidosis 04/19/2022  Major depressive disorder, recurrent episode, moderate (HC Code) (HC CODE) 04/02/2022  Post-traumatic stress disorder, chronic 04/02/2022  Parotitis 10/13/2021  Mediastinal lymphadenopathy 10/13/2021  Breast pain 10/13/2021  Eosinophilic esophagitis 08/26/2020  Vitamin D deficiency Chronic kidney disease   Polycystic kidney disease   Lumbar degenerative disc disease 04/19/2016  Lumbar spondylosis 04/19/2016  Chronic bilateral low back pain without sciatica 02/25/2016  Essential hypertension 02/25/2016  MVP (mitral valve prolapse) 02/25/2016  Kidney stone 03/18/2011  Palpitations 03/18/2011 Electronically Signed by Rodgers Mau, MD, August 22, 2025[facility] MDAssociate Professor of MedicineGastroenterology and Transplant Hepatology [1] Past Medical History:Diagnosis Date  Chronic kidney disease   PKD, but normal creatinine  Diabetes mellitus (HC Code)    Encounter for blood transfusion   per pt, no rxn  Eosinophilic esophagitis 08/26/2020  GERD (gastroesophageal reflux disease)   History of torn meniscus of right knee 02/2022  Hypertension   Mitral valve prolapse   Mitral valve regurgitation   Palpitations   Polycystic kidney disease   Sickle cell trait (HC Code)   [place] testing 2021  Vitamin D deficiency  [2] Past Surgical History:Procedure Laterality Date  APPENDECTOMY  1984  BRONCHOSCOPY    w/ EBUS  COLONOSCOPY    DILATION AND CURETTAGE OF UTERUS    HYSTERECTOMY  08/30/2017  LAPAROSCOPY    LEEP    TONSILLECTOMY    TUBAL LIGATION    prior to the hysterectomy  UPPER GASTROINTESTINAL ENDOSCOPY   [3] Current Outpatient Medications Medication Sig Dispense Refill  BLOOD GLUCOSE METER (ONETOUCH ULTRA2 METER) device USE TO CHECK BLOOD SUGAR DAILY E11.9 (Patient not taking: Reported on 12/30/2023) 1 each 0  blood sugar diagnostic (GLUCOSE BLOOD) test strips Testing Frequency: Three times daily DX E11.9. Accucheck test strips. (Patient not taking: Reported on 12/30/2023) 100 each 3  tirzepatide (MOUNJARO) 2.5 mg/0.5 mL Pen Injector Inject 1 Pen (2.5 mg total) under the skin once a week. (Patient not taking: Reported on 12/30/2023) 2 mL 0 No current facility-administered medications for this visit.

## 2024-01-02 LAB — MITOCHONDRIA M2 ANTIBODY (IGG), EIA: BKR MITOCHONDRIAL M2 ANTIBODY, IGG: 24 U/mL — ABNORMAL HIGH (ref <4.0)

## 2024-01-03 ENCOUNTER — Encounter: Admit: 2024-01-03 | Payer: PRIVATE HEALTH INSURANCE | Attending: Gastroenterology | Primary: Internal Medicine

## 2024-01-03 DIAGNOSIS — D869 Sarcoidosis, unspecified: Principal | ICD-10-CM

## 2024-01-03 DIAGNOSIS — K753 Granulomatous hepatitis, not elsewhere classified: Secondary | ICD-10-CM

## 2024-01-03 DIAGNOSIS — K831 Obstruction of bile duct: Secondary | ICD-10-CM

## 2024-01-03 MED ORDER — URSODIOL 500 MG TABLET
500 | ORAL_TABLET | Freq: Two times a day (BID) | ORAL | 6 refills | 30.00000 days | Status: AC
Start: 2024-01-03 — End: ?

## 2024-01-17 ENCOUNTER — Encounter: Admit: 2024-01-17 | Payer: PRIVATE HEALTH INSURANCE | Primary: Internal Medicine

## 2024-01-19 ENCOUNTER — Ambulatory Visit: Admit: 2024-01-19 | Payer: PRIVATE HEALTH INSURANCE | Attending: Gastroenterology | Primary: Internal Medicine

## 2024-01-19 ENCOUNTER — Encounter: Admit: 2024-01-19 | Payer: PRIVATE HEALTH INSURANCE | Primary: Internal Medicine

## 2024-01-19 ENCOUNTER — Encounter: Admit: 2024-01-19 | Payer: PRIVATE HEALTH INSURANCE | Attending: Rheumatology | Primary: Internal Medicine

## 2024-01-19 VITALS — BP 130/88 | HR 94 | Temp 97.90000°F | Resp 19 | Ht 64.0 in | Wt 161.3 lb

## 2024-01-19 VITALS — Ht 64.0 in | Wt 161.4 lb

## 2024-01-19 DIAGNOSIS — K200 Eosinophilic esophagitis: Secondary | ICD-10-CM

## 2024-01-19 DIAGNOSIS — Z5189 Encounter for other specified aftercare: Secondary | ICD-10-CM

## 2024-01-19 DIAGNOSIS — D573 Sickle-cell trait: Secondary | ICD-10-CM

## 2024-01-19 DIAGNOSIS — Q613 Polycystic kidney, unspecified: Secondary | ICD-10-CM

## 2024-01-19 DIAGNOSIS — I341 Nonrheumatic mitral (valve) prolapse: Secondary | ICD-10-CM

## 2024-01-19 DIAGNOSIS — I10 Essential (primary) hypertension: Secondary | ICD-10-CM

## 2024-01-19 DIAGNOSIS — N189 Chronic kidney disease, unspecified: Principal | ICD-10-CM

## 2024-01-19 DIAGNOSIS — R748 Abnormal levels of other serum enzymes: Secondary | ICD-10-CM

## 2024-01-19 DIAGNOSIS — R002 Palpitations: Secondary | ICD-10-CM

## 2024-01-19 DIAGNOSIS — E119 Type 2 diabetes mellitus without complications: Secondary | ICD-10-CM

## 2024-01-19 DIAGNOSIS — I340 Nonrheumatic mitral (valve) insufficiency: Secondary | ICD-10-CM

## 2024-01-19 DIAGNOSIS — D869 Sarcoidosis, unspecified: Principal | ICD-10-CM

## 2024-01-19 DIAGNOSIS — K219 Gastro-esophageal reflux disease without esophagitis: Secondary | ICD-10-CM

## 2024-01-19 DIAGNOSIS — Z87828 Personal history of other (healed) physical injury and trauma: Secondary | ICD-10-CM

## 2024-01-19 DIAGNOSIS — E559 Vitamin D deficiency, unspecified: Secondary | ICD-10-CM

## 2024-01-19 MED ORDER — DICLOFENAC 1 % TOPICAL GEL
1 | Freq: Four times a day (QID) | TOPICAL | 3 refills | Status: AC | PRN
Start: 2024-01-19 — End: ?

## 2024-01-19 NOTE — Patient Instructions
 Schedule MRI of knee [phone] will touch base after results are back

## 2024-01-19 NOTE — Progress Notes
 [place] RheumatologyDiagnosis: Undifferentiated Connective Tissue Disease /SarcoidSerologies: +ANA 1:2560 nuclear dots; negative Sm, RNP, SSA, SSB, dsDNA, LA not detected, B2GP, ACL Ab negative, CCP negative, RF negativeManifestations: alopecia, arthralgias, dry eyes, dry mouthClinical course: FNA 11/2021 of lymph node taken during bronchoscopy showed non-caseating granulomas inflammation; biopsy of portal lymph node 09/2023 showed non-necrotizing granulomasPast medications: hydroxychloroquine  200 mg BIDCurrent therapy: noneHPI from 08/2018:Ms Witherington presents for initial visit, never saw a rheumatologist in the past.Elisabet Delia is a 58 y.o. female with PMHx notable for HTN, polycystic kidney disease, who presents for initial visit. She was referred by her gastroenterologist Dr. Lorenso for concern of positive ANA, found as part of evaluation for dysphagia. She has underwent EGD and colonoscopy fall 2019 but notes symptoms persist despite initiation of PPI. She reports fatigue, hair thinning, episode of oral ulcers, along with arthralgia and myalgia, pregnancy loss at 3 months and possible issue with her platelets in the past. 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 Pt encouraged to use MyChart to let us  know if she has any questions or concerns.Interval Change:--Seen 12/2018, we discussed how her presentation may be suggestive of an evolving UCTD for which we discussed starting HCQ (AEs discussed) to which she agreed. She will start HCQ 200 mg daily and schedule Ophthalmology visit with [facility]. She will return for followup in 3 months or earlier PRN.--Seen 05/22/2019, on HCQ 200 mg daily that was started after her last visit, noting overall improvement, particularly in her MSK symptoms. She still endorses sicca, hair thinning, and fatigue; she describes some mild hand symptoms that are suggestive of inflammatory arthritis. We discussed continuing to monitor on HCQ 200 mg daily--she is up to date on Ophthalmology evaluation and has VF testing next week. We will see her for followup in 3-4 months.  --Seen 08/28/2019, on HCQ 200 mg daily, noting some MSK symptoms that are seem more mechanical in etiology. She also continues to have hair thinning, dry mouth, and some dysphagia. Her last GI visit was in early 2020 after endoscopies in 2019; I encouraged her to followup with GI prior to considering barium swallow study. At this time, we discussed scheduling an in-person visit and will plan to repeat labs before her followup in 3-4 months. 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 --Seen 12/03/2021, for her first in-person visit with me. She was started on HCQ but returns today after she self-DCd one year prior. Recently, clinical course c/b parotitis and thoracic adenopathy, now undergoing [place] abdomen/pelvis and evaluation by Oncology. Mammogram and ultrasound notable for BI-RAD2. She also describes fatigue, sicca, dysphagia, cramping abdominal pain. At this time, we discussed awaiting possible biopsy for further elucidation of underlying process--we will touch base after workup for thoracic adenopathy. In the interim, we will repeat serologies. We will hold on resuming HCQ for now. --Seen 02/11/2022, with palpitations and going to see Cardiology next month--also recommended to schedule PET myocardial for sarcoid involvement. She continues to have some SOB and noted to have restriction on PFTs concerning for neuromuscular involvement so myositis panel was ordered. At this time, we discussed how it is unclear if her palpitations are related to sarcoid and we will await additional workup. We will also check muscle enzymes along with monitoring labs. In the interim, she was encouraged to restart HCQ, which she is not taking. --Seen 05/17/2022, noting ongoing  palpitations s/p Cardiology evaluation, noted to unlikely to have cardiac sarcoid given no evidence of conduction disease, arrhythmia, or syncope/heart failure symptoms. She last saw Pulmonary 04/2022, recommended for PFTs in one year, with no indication for treatment. She is having fatigue. She notes MSK symptoms in her left mid-back that is suggestive of mechanical etiologies, other joints are overall stable. She is also using topical treatment for rash.  At this time, she was encouraged to schedule followup with Dermatology to evaluate for active skin in the setting of sarcoid, as there is no indication for immunosuppression in the setting of pulmonary and cardiac systems. Her MSK symptoms seem more from mechanical etiologies (normal CK, aldolase, ESR 21, CRP <10) for which we will start trial of diclofenac topical. We will continue to monitor clinically--Seen 09/16/2022, noting SOB upon exertion, s/p repeat [place] chest 08/2022 that showed hilar adenopathy but no change in lung findings, Pulmonary team recommended treatment for post nasal drip, no systemic treatment for sarcoid. She continues to have low back pain despite Tylenol  PRN, cyclobenzaprine  PRN. She had worsened symptoms with PT and now referred to aquatherapy. Other joints okay. At this time, we discussed how her low back symptoms are most likely mechanical but will proceed with MRI lumbar spine to exclude sarcoid involvement--we will also repeat labs this summer. Otherwise, no indication for immunosuppression for her pulmonary and cardiac systems. --Seen 03/31/2023, noting ongoing back pain for which she will see Spine Surgery. She also has continued chest tightness despite PFTs showing normal DLCO and [place] imaging that did not show CAC. She has recently underwent MRI abdomen without contrast that demonstrated hepatic lesions most c/w benign cyst versus hamartoma, mild hepatic steatosis, no suspicious masses or adenopathy. At this time, we discussed how she fortunately has not needed immunosuppression for sarcoid as her back symptoms are in the setting of DDD. She was encouraged to schedule followup with Pulmonary. We will repeat monitoring labs before her next visit.  --Seen 09/29/2023, noted to have worsening pleuropulmonary findings on [place] as well as PFTs showing restriction, reduced lung volumes. Plan was to trial prednisone  but she never started. She also has been having elevated liver enzymes since 05/2023, s/p RUQ U/S which showed peripancreatic mass, suggesting a peripancreatic/periportal lymph node. She underwent EUS and biopsy of portal lymph node 09/2023 showed non-necrotizing granulomas. She continues to have SOB upon exertion s/p cardiac workup that has included Holter monitoring (PACs, PVCs, SVT) but declined additional imaging in the past. We discussed assessing for cardiac involvement with PET Donna sarcoid to which she is now agreeable. We will then discuss with Pulmonary, GI, and Cardiology next steps as she will likely need to start immunosuppression. Today:She saw GI/Hepatology Dr. Meliton, scheduled for fibroscan today. She has been having left knee pain in July, felt stabbing pain. She has been going to PT. She has been seen by Orthopaedic . She notes her breathing is better today, gets winded upon exertion. Review of Systems (negative except as noted; positive findings bold)Constitutional: weight changes, fatigue, malaise, fever, chills, sweatsSkin: rashes, photosensitivity, hives, easy bruisability, alopecia, scalp tenderness, skin nodules or psoriasis Eyes:  pain, redness, itching, visual blurring, dryness, foreign body sensationENT: tinnitus, hearing loss, sinus congestion, loss of smell, dry nose, bloody nose, jaw claudication, oral ulcers, loss of taste, dry mouth, hoarsenessCardiac: chest pain, palpitations, irregular heartbeat, exertional dyspneaVascular: Raynaud?s, chilblains, frostbite, venous stasis, thrombosisPulmonary: shortness of breath, cough, wheeze, hemoptysis, chest wall pain, pleuritic chest pain, current tobacco use, GI: difficulty swallowing, nausea, vomiting,  GERD, ulcers, constipation, diarrhea, change in bowel habits, abdominal pain, liver diseaseGU: genital ulcers, dysuria, blood in urine, nocturia, infections, kidney stonesMusculoskeletal: morning stiffness, neck pain, back pain, joint pain, joint swelling, muscle aching or tenderness, muscle weakness, chest wall tightnessNeurological: numbness, tingling, headaches, fainting, dizziness, imbalance, memory loss, seizure, strokeHeme/Lymph: swollen or tender glands, anemiaPsychiatric:  anxiety, irritability, depression, sleep disturbancePast Medical History:Past Medical History: Diagnosis Date  Chronic kidney disease   PKD, but normal creatinine  Diabetes mellitus (HC Code)    Encounter for blood transfusion   per pt, no rxn  Eosinophilic esophagitis 08/26/2020  GERD (gastroesophageal reflux disease)   History of torn meniscus of right knee 02/2022  Hypertension   Mitral valve prolapse   Mitral valve regurgitation   Palpitations   Polycystic kidney disease   Sickle cell trait (HC Code)   Natera testing 2021  Vitamin D deficiency  Past Surgical History: Procedure Laterality Date  APPENDECTOMY  1984  BRONCHOSCOPY    w/ EBUS  COLONOSCOPY    DILATION AND CURETTAGE OF UTERUS    HYSTERECTOMY  08/30/2017  LAPAROSCOPY    LEEP    TONSILLECTOMY    TUBAL LIGATION    prior to the hysterectomy  UPPER GASTROINTESTINAL ENDOSCOPY   Allergy:Allergies Allergen Reactions  Penicillins Hives   Hives Has patient had a PCN reaction causing immediate rash, facial/tongue/throat swelling, SOB or lightheadedness with hypotension: YESHas patient had a PCN reaction causing severe rash involving mucus membranes or skin necrosis: NOHas patient had a PCN reaction that required hospitalization NOHas patient had a PCN reaction occurring within the last 10 years: NOIf all of the above answers are NO, then may proceed with Cephalosporin use.  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 Accucheck test strips.  OneTouch Ultra2 Meter USE TO CHECK BLOOD SUGAR DAILY E11.9 (Patient not taking: Reported on 12/30/2023)  ursodioL  Take 1 tablet (500 mg total) by mouth 2 (two) times daily. (Patient not taking: Reported on 01/19/2024) No current facility-administered medications for this visit. Physical ExaminationBP 130/88  - Pulse (!) 94  - Temp 97.9 ?F (36.6 ?C) (Oral)  - Resp 19  - Ht 5' 4 (1.626 m)  - Wt 73.2 kg  - LMP 08/30/2017 (Exact Date) Comment: hyst 08/30/2017 - SpO2 99%  - BMI 27.69 kg/m? General: female patient in no acute distress, limping due to left LE painHEENT: no scleral icterus; no conjunctival injectionsNeck: supple; no cervical or supraclavicular lymphadenopathyCVS: regular rate and rhythm and S1, S2 Respiratory: grossly clear to auscultation bilaterallyAbdomen: non-distendedExtremities: no edemaMSK:upper extremity joints are normal without synovitis, enthesitis, dactylitis, or limitation in ROMNo pain upon ROM of hipsWarmth over left knee with fullness, tenderness over joint lines5/5 strength Back: no tenderness to palpation over vertebraeSkin: skin color, texture, turgor normal; fading rash over anterior chestNeuro: alert and oriented; motor strength symmetrical proximally and distally in upper and lower extremitiesPsychiatric: mood/affect appropriate; well-kempt Lab and Imaging ResultsLab Results Component Value Date  WBC 5.4 10/11/2023  HGB 12.8 10/11/2023  HCT 39.00 10/11/2023  MCV 86.3 10/11/2023  PLT 264 10/11/2023 Lab Results Component Value Date  CREATININE 0.53 10/11/2023  BUN 7 10/11/2023  NA 140 10/11/2023  K 4.5 10/11/2023  CL 104 10/11/2023  CO2 24 10/11/2023 Lab Results Component Value Date  ALT 34 12/30/2023  AST 42 (H) 12/30/2023  GGT 149 (H) 10/11/2023  ALKPHOS 172 (H) 12/30/2023  BILITOT 0.5 12/30/2023  12/2019ANA	1:1280 nuclear, multiple nuclear dotsdsDNA	Negative Scl-70 	Negative SSA	NegativeSSB	NegativeJo-1 	NegativeCCP 	<16RF IgM 	10ANCA	NegativeSm Ab	NegativeMito Ab	Negative C3	161C4	4212/19/2019 [place] abdomen pelvisThe liver, gallbladder, spleen, pancreas, and adrenal glands are unremarkable, except for tiny hepatic cysts. There are multiple bilateral renal cysts and hypodensities too small to characterize, including a 2.0 cm lesion in the right kidney that likely represents a cyst with new hemorrhagic/proteinaceous debris (image 35, series 2). There is no bowel obstruction, ascites, or lymphadenopathy. No aggressive osseous lesions are seen.IMPRESSION: No evidence of metastatic disease.08/30/2022 [place] chestSlight increase in the size of mediastinal adenopathy compared to study from June 2023. No change in lung findings including a few perilymphatic nodules. No significant bronchiectasis or architectural distortion in the lungs. 06/09/2023 PFTsSpirometry is suggestive of restriction. Severely reducced lung volumes, normal diffusing capacity. 07/03/2023 [place] chestWorsening pleuropulmonary findings related to known sarcoidosis.Unchanged thoracic adenopathy.10/14/2023 [place] abdomen/pelvisRedemonstration of innumerable subcentimeter lesions throughout the liver and spleen, increased in size and number, though comparison is difficult given differences in modalities.Stable periportal/gastrohepatic lymphadenopathy. New mild retroperitoneal lymphadenopathy.11/07/2023 PET [place] Myocardial Sarcoid Evaluation * 1) Normal fasting FDG PET with rest RB-82 myocardial perfusion imaging for the evaluation of cardiac sarcoidosis but with extensive non-cardiac FDG uptake (see below)* 2) There was no myocardial FDG uptake.  Perfusion imaging was normal.* 3) Global left ventricular systolic function was normal and normal regional wall motion.  The left ventricle was normal in size.* 4) [place] performed for attenuation correction showed no significant coronary artery calcifications.  Non-cardiac findings of the  are described in a separate report from Radiology.* 5) There is extensive FDG avid lymphadenopathy pericardial, para-aortic, bilateral hilar, subcarinal lymph nodes, as well as extensive nodular FDG abnormalities in the liver (SUVmax 24) and less so in the spleen. In the correct clinical context, these are consistent with active systemic sarcoidosis. SummaryMerashe Gathright is a 58 y.o. 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 She was noted to have worsening pleuropulmonary findings on  06/2023 as well as PFTs 05/2023 showing restriction, reduced lung volumes. Plan was to trial prednisone  but she never started. She also has been having elevated liver enzymes since 05/2023, s/p RUQ U/S which showed peripancreatic mass, suggesting a peripancreatic/periportal lymph node. 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 At this time, we discussed how her fatigue and respiratory symptoms are likely in the setting of active sarcoid--her PET myocardial sarcoid fortunately did not demonstrated FDG uptake in the myocardium but did show avid LAD in the lymph nodes, liver and spleen. We discussed a course of prednisone  that was previously recommended by Pulmonary which she will think over. She has been experiencing left LE discomfort that is predominantly in the area of her knee that has persisted >8 weeks despite conservative treatment (OTC analgesics, PT) so we will proceed with MRI as xray imaging was largely unremarkable. We will also followup pending results including fibroscan.RecommendationsUCTDSarcoidosis (biopsy proven with involvement of lymph nodes, likely liver)Labs/imaging to monitor disease activity and medications: Fibroscan as scheduledLeft knee pain and swelling since 11/2023--no improvement despite home exercise/PTTrial of diclofenac topicalMRI of left kneePatient  education/counseling RE: UCTD, sarcoidFollow up: The patient knows to call with any concerns before the next appointment. Next visit: 3 monthsOn the day of this patient's encounter, a total of 42 minutes was personally spent by me.  This does not include any resident/fellow teaching time, or any time spent performing a procedural service.Dickey Bones, MDYale Rheumatology [email or web address]/[email or web address]

## 2024-01-20 NOTE — Procedures
 Liver Elastography (FibroScan?) Procedure NoteIndication: Abnormal liver enzymes Previous Biopsy: NoBMI: 27.70 kg/m2NPO X 3 Hours: YesProbe: MMedian Liver Stiffness: 13.2 kPaIQR: 11%Controlled Attenuation Parameter: 215 dB/m Interpretation:Median liver stiffness measurement of 13.2 kPa. These findings are consistent with a high risk of clinically significant hepatic fibrosis, confirmatory testing and hepatology consult recommended.A median controlled attenuation parameter (CAP) measurement of 215 dB/m corresponds to a steatosis score of S0.Please note: liver congestion, liver inflammation and an elevated BMI (> 30 kg/m2) may affect liver elastography results.  Reference Values:Condition Low Risk Intermediate Risk High Risk Alcohol-Related Disease 2.0-8.9 kPa 9.0-18.5 kPa >18.5 kPa Cholestatic Disease 2.0-8.4 kPa 8.5-16.5 kPa >16.5 kPa Hepatitis B / C 2.0-6.9 kPa 7.0-12.5 kPa >12.5 kPa MASH / Other 2.0-7.9 kPa 8.0-12.9 kPa >12.9 kPa Steatosis Grade S0(<11% fat in the liver) S1(11-33% fat in the liver) S2(34-66% fat in the liver) S3(>66% fat in the liver) CAP Score <238 dB/m 238-258 dB/m 259-289 dB/m >289 dB/m

## 2024-01-23 ENCOUNTER — Encounter: Admit: 2024-01-23 | Payer: PRIVATE HEALTH INSURANCE | Attending: Gastroenterology | Primary: Internal Medicine

## 2024-01-31 ENCOUNTER — Encounter: Admit: 2024-01-31 | Payer: PRIVATE HEALTH INSURANCE | Attending: Rheumatology | Primary: Internal Medicine

## 2024-02-01 ENCOUNTER — Telehealth
Admit: 2024-02-01 | Payer: PRIVATE HEALTH INSURANCE | Attending: Student in an Organized Health Care Education/Training Program | Primary: Internal Medicine

## 2024-02-01 ENCOUNTER — Encounter
Admit: 2024-02-01 | Payer: PRIVATE HEALTH INSURANCE | Attending: Student in an Organized Health Care Education/Training Program | Primary: Internal Medicine

## 2024-02-01 DIAGNOSIS — D869 Sarcoidosis, unspecified: Principal | ICD-10-CM

## 2024-02-01 NOTE — Telephone Encounter
 Pt is reporting that she feels more shortness of breath and fatigue with any activity, a change from prior.  She is also reporting that she feels like she has a UTI, she has some back pain and dysuria.  She spoke to her pcp office who gave her a virtual visit upcoming.  She reports that she may go to an urgent care today to address, but she was concerned about the worsening shortness of breath and was asking for an appt with Dr Georgiann.  She is not reporting that this is sudden onset but rather increasing over some time.  I advised the pt if she did go to an urgent care, she should voice the concern for the worsening fatigue and shortness of breath at that visit.  Scheduled her for the urgent appt for tomorrow am

## 2024-02-02 ENCOUNTER — Inpatient Hospital Stay: Admit: 2024-02-02 | Discharge: 2024-02-02 | Payer: Medicare (Managed Care) | Primary: Internal Medicine

## 2024-02-02 ENCOUNTER — Ambulatory Visit
Admit: 2024-02-02 | Payer: Medicare (Managed Care) | Attending: Student in an Organized Health Care Education/Training Program | Primary: Internal Medicine

## 2024-02-02 ENCOUNTER — Encounter: Admit: 2024-02-02 | Payer: PRIVATE HEALTH INSURANCE | Primary: Internal Medicine

## 2024-02-02 ENCOUNTER — Encounter
Admit: 2024-02-02 | Payer: PRIVATE HEALTH INSURANCE | Attending: Student in an Organized Health Care Education/Training Program | Primary: Internal Medicine

## 2024-02-02 VITALS — BP 117/82 | HR 90 | Temp 97.60000°F

## 2024-02-02 VITALS — Ht 63.386 in | Wt 163.1 lb

## 2024-02-02 DIAGNOSIS — D869 Sarcoidosis, unspecified: Secondary | ICD-10-CM

## 2024-02-02 DIAGNOSIS — R0602 Shortness of breath: Secondary | ICD-10-CM

## 2024-02-02 MED ORDER — ASCORBIC ACID (VITAMIN C) 500 MG TABLET
500 | ORAL | 3.00 refills | 50.00000 days | Status: AC
Start: 2024-02-02 — End: ?

## 2024-02-02 MED ORDER — FLUTICASONE 500 MCG-SALMETEROL 50 MCG/DOSE BLISTR POWDR FOR INHALATION
500-50 | Freq: Two times a day (BID) | RESPIRATORY_TRACT | 12 refills | Status: AC
Start: 2024-02-02 — End: ?

## 2024-02-02 NOTE — Telephone Encounter
 Started PA for [employer]

## 2024-02-02 NOTE — Patient Instructions
 It was a pleasure to meet you in clinic today.  Today we discussed your diagnosis of sarcoidosisThe immediate plan includes the following studies and/or referrals:Start Advair for your breathing. Please keep me updated on any changes in your breathing and any acute illnesses, particularly if hospitalization is required. Our goal at the [facility] Sarcoidosis Disease Program is to provide you comprehensive care and support from diagnosis onward.[facility]: [phone]: [phone] (to schedule [place] scans): [phone]

## 2024-02-02 NOTE — Progress Notes
 [facility] of Medicine, Section of Pulmonary, Critical Care, and Sleep Medicine	[facility] [address];  [place], [place] [address]:  [phone]  Fax: [phone]				Director, Edsel Mays, MD			Associate Director, Dennard Lockwood, MD, [facility] Georgiann Alamo, MD, MScAlexander [place], MDErica Whitman, MD, PhDJean Deward Square, MDGenta Ishikawa, MD, MPHChangwan Arnoldo Luster, MD, MPHJonathan Siner, MDRachel Marine, PALisa Rebmann, RNChristine Tancreti, RNPatient Name:  Cathy Evans of Birth:  August 21, 1967Date of Service: 9/25/2025EPIC MRN:  [id number] Provider:  Gaylia JAYSON Schirmer, MD had the pleasure of seeing Cathy Evans in follow up for sarcoidosis.  HPI:She was found to have mediastinal adenopathies on [place] chest as part of the workup for a neck mass. 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 At our first visit, she had some CTD related symptoms as well as palpitations. She had a Holter monitor for 2 weeks which was unrevealing. 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 LAST VISIT 3/5/25Since her last visit, she states she still continues to feel short of breath, it has been mildly more than how it was in January. Also gets cough when she feels short of breath. Given increased nodularity on [place] scan and worsening symptoms we discussed the role of treatment today. Start prednisone  30mg  QD with atovaquone  for PJP prophy (has sulfa allergy). I will call her in 3 weeks, if no improvement then would need to look for alternative causes for dyspnea and I will wean her pred down. If improvement, then will switch to MTX for long acting treatment. INTERIM HISTORY:08/03/23 Only took prednisone  for 5 days because she did not want to gain weight. 09/15/23  EUS and biopsy of portal lymph node showed non-necrotizing granulomas 12/30/23 Hepatology [facility]): seen for hepatic sarcoidosis. Obtained fibroelastography  01/19/24 Rheum Cathy Evans): wait for fibroscan to determine treatment.  CURRENT VISIT 02/02/2024:Since her last visit, Cathy Evans reports worsening SOB on ambulation, started again about 3 weeks ago, and coincided with getting a cold 3-4 weeks ago.  She has no orthopnea and no cough She has noticed occasional palpitations. No edema MEDICATIONS: Current Outpatient Medications:   ascorbic acid (vitamin C), 1 tab(s) orally once a day; Duration: 30 day(s)  OneTouch Ultra2 Meter, USE TO CHECK BLOOD SUGAR DAILY E11.9 (Patient not taking: Reported on 01/19/2024)  glucose blood, Testing Frequency: Three times daily DX E11.9. Accucheck test strips.  diclofenac , Apply topically 4 (four) times daily as needed for pain.  fluticasone  propion-salmeteroL, Inhale 1 puff into the lungs every 12 (twelve) hours.  ursodioL , Take 1 tablet (500 mg total) by mouth 2 (two) times daily. (Patient not taking: Reported on 02/02/2024)ALLERGIES:Allergies[1]PAST MEDICAL HISTORY:Past Medical History: Diagnosis Date  Chronic kidney disease   PKD, but normal creatinine  Diabetes mellitus (HC Code)    Encounter for blood transfusion   per pt, no rxn  Eosinophilic esophagitis 08/26/2020  GERD (gastroesophageal reflux disease)   History of torn meniscus of right knee 02/2022  Hypertension   Mitral valve prolapse   Mitral valve regurgitation   Palpitations   Polycystic kidney disease   Sickle cell trait (HC Code)   Natera testing 2021  Vitamin D deficiency  Past Surgical History: Procedure Laterality Date  APPENDECTOMY  1984  BRONCHOSCOPY    w/ EBUS  COLONOSCOPY    DILATION AND CURETTAGE OF UTERUS    HYSTERECTOMY  08/30/2017  LAPAROSCOPY    LEEP    TONSILLECTOMY    TUBAL LIGATION    prior to the hysterectomy  UPPER GASTROINTESTINAL ENDOSCOPY  PHYSICAL EXAMINATION:Vital signs:  BP 117/82 (Site: r a, Cuff Size: Medium)  - Pulse 90  - Temp 97.6 ?F (36.4 ?C) (Temporal)  - LMP 08/30/2017 (Exact Date) Comment: hyst 08/30/2017 - SpO2 99%  Weight   lbs. on room airGeneral: well appearing, well developed, well nourished, and no apparent respiratory distressHEENT:  normocephalic and atraumaticEyes:  sclera anicteric, PERRL, extraoccular movements intact, and normal conjunctivaNeck:  supple, no palpable lymphadenopathy, no  thyromegaly, and no JVD Cardiovascular:  regular rate and rhythm, normal S1 and S2, and no murmurs rubs or gallopsRespiratory: chest with normal AP diameter, no retractions, clear to auscultation bilaterally, normal percussion, no wheezing , and no ronchiGastrointestinal: nontender, nondistended, and no massesExtremities:  no cyanosis, no clubbing, no edema, and no inflammatory or deforming arthritis Musculoskeletal: normal bulk, normal tone, normal strength, and no spinal tenderness   Hematologic/Lymph: no lymphadenopathy and no ecchymosesSkin: skin intact, no rash, no skin thickening, no sclerodactyly, no Raynaud's, and no digital ulceration  Neuro:  no gross focal deficits, PERRL, gait normal, and extraoccular movements intactPsychiatric: normal mood and affect  REVIEW OF DATA:Pulmonary Function Testing: Date FVC FEV1 FEV1/FVC TLC DLCO  01/21/2022 2.46 (73%) 1.96 (73%) 80% 3.44 (66%) 15.95 (78%) 02/04/23 2.02 (65) 1.62 (64) 80% 3.35 (64) 16.33 (81) 06/09/23 2.17 (71) 1.62 (66) 75% 2.85 (56) 17.06 (85) 02/02/24 2.17 (73) 1.51 (63) 70%  15.58 (79)  Personal Interpretation:  No obstruction, moderate restriction, normal DLCO. FVC and DLCO are improved Chest Radiology: 10/18/21 [place] ChestLungs/Airways/Pleura: Central tracheobronchial tree is patent. Minimal tree-in-bud and groundglass opacities in the right lower lobe, likely infectious or inflammatory etiology (4:367). No reticulations, bronchiectasis, honeycombing, architectural distortion or other evidence of interstitial lung disease. No suspicious pulmonary nodules or masses. No pleural effusion or pneumothorax.Mediastinum/Lymph nodes: Left subclavicular, superior mediastinal, right paratracheal, prevascular, bilateral hilar and subcarinal adenopathy. Largest nodes measure up to 14 mm in the subcarinal region. [place] Chest 4/22/24Slight increase in the size of mediastinal adenopathy compared to study from June 2023. No change in lung findings including a few perilymphatic nodules. No significant bronchiectasis or architectural distortion in the lungs.  HRCT - 07/03/23 - Worsening pleuropulmonary findings related to known sarcoidosis with no true parenchymal progression. Unchanged thoracic adenopathy.  Other Radiology/Studies:[place] AP 6/23/23The liver, gallbladder, spleen, pancreas, and adrenal glands are unremarkable, except for tiny hepatic cysts. Again seen are multiple bilateral renal cysts and hypodensities too small to characterize, including a 2.9 cm lesion in the right kidney that likely represents a cyst with new hemorrhagic/proteinaceous debris (image 39, series 2) that has increased from 2.0 cm. There is no bowel obstruction, ascites, or lymphadenopathy. No aggressive osseous lesions are seen.  Pathology:7/11/23Lung, right mainstem, endobronchial biopsy:       - Superficial fragments of bronchial mucosa with no significant abnormality.  FNA lung1.  RIGHT MIDDLE LOBE BRONCHOALVEOLAR LAVAGE:      - NEGATIVE FOR MALIGNANT CELLS.      - REACTIVE PULMONARY MACROPHAGES.      - GMS STAIN IS NEGATIVE FOR FUNGUS/PJP.      - AFB STAIN IS NEGATIVE FOR MYCOBACTERIA. 2.  LYMPH NODE, 4R, FINE NEEDLE ASPIRATION/WASH:      - NEGATIVE FOR MALIGNANT CELLS.           - REACTIVE LYMPHOCYTES AND FOCAL LYMPHOHISTIOCYTIC AGGREGATES. 3.  LYMPH NODE, 10R, FINE NEEDLE ASPIRATION/WASH: - NEGATIVE FOR MALIGNANT CELLS.           - REACTIVE LYMPHOCYTES AND FOCAL LYMPHOHISTIOCYTIC AGGREGATES. 4.  LYMPH NODE, 4L, FINE  NEEDLE ASPIRATION/WASH:      - NEGATIVE FOR MALIGNANT CELLS.      - FOCAL NON-CASEATING GRANULOMATOUS INFLAMMATION.      - GMS STAIN IS NEGATIVE FOR FUNGUS/PJP.      - AFB STAIN IS NEGATIVE FOR MYCOBACTERIA.  Cardiac studies:07/11/20 * Normal left ventricular size, wall thickness, systolic function and wall motion. LVEF calculated by 3DE was 69%.  Diastolic function was difficult to determine due to mitral valve abnormalities.* Normal right ventricular cavity size and systolic function.  Right ventricular systolic pressure is unable to be estimated due to insufficient Doppler signal.* Atria are normal in size.  No interatrial shunt by color Doppler.  Interatrial septum is bowed toward the left, consistent with elevated right atrial pressure.* Normal mitral valve leaflets.  Moderate posteriorly directed mitral regurgitation. By PISA, EROA PISA is 0.24 cm2 and regurgitant volume 46 ml.* All visible segments of the aorta are normal in size.* IVC diameter < 2.1 cm that collapses > 50% with a sniff suggests normal RAP (0-5 mmHg, mean 3 mmHg).  Echo 03/24/23 * Normal left ventricular size, systolic function and wall motion. Mild concentric left ventricular hypertrophy.  LVEF calculated by 3DE was 63%.  Normal global longitudinal strain (-21%).  Normal diastolic function and filling pressures.* Normal right ventricular cavity size and systolic function.  Estimated right ventricular systolic pressure is 26 mmHg.* Mild mitral regurgitation.* Mild tricuspid regurgitation.* Normal sinuses of Valsalva with a diameter of 2.9 cm and normal ascending aorta with a diameter of 3.0 cm.* IVC diameter < 2.1 cm that collapses > 50% with a sniff suggests normal RAP (0-5 mmHg, mean 3 mmHg).11/07/23 Myocardial PET  * 1) Normal fasting FDG PET with rest RB-82 myocardial perfusion imaging for the evaluation of cardiac sarcoidosis but with extensive non-cardiac FDG uptake (see below)* 2) There was no myocardial FDG uptake.  Perfusion imaging was normal.* 3) Global left ventricular systolic function was normal and normal regional wall motion.  The left ventricle was normal in size.* 4) [place] performed for attenuation correction showed no significant coronary artery calcifications.  Non-cardiac findings of the [place] are described in a separate report from Radiology. Labs:10/2021:CRP 5.1, ESR 34Neg ACE, RFNormal complementIgG: 1625, remainder unremarkableANA > 1:2560 (nuclear dots) 1:160 speckled. DsDNA, RNP, SSA, SSB, Smith, all negative 12/2019ANA     1:1280 nuclear, multiple nuclear dotsdsDNA Negative Scl-70  Negative SSA     NegativeSSB     NegativeJo-1     NegativeCCP     <16RF IgM            10ANCA  NegativeSm Ab NegativeMito Ab            Negative C3        161C4        426/3/25ACE 109IgG4 42.7CRP 13.2GGT 149IgG 1648IgA 592Alk phos 167ALT 43AST 47ESR 61CK 54 IMPRESSION:Chandelle Worthing is a 58 y.o. 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 On [place] scan she has no evidence of parenchymal involvement from sarcoidosis.  She is evidence of restriction on pulmonary function tests and I believe these could be due to neuromuscular weakness given that her parenchymal findings are quite minimal.  RECOMMENDATIONS:SarcoidosisPathology: EBUS with FNA showing non caseating granulomasOrgan involvement: LADsPrimary target organ for therapy: none currentlyCurrent treatment: nonePast treatments: noneFurther work-up:Start ICS/LABA today for reactive airway disease likely secondary to viral  infection. Annual vitamin D levels. Continue  daily sinus rinses followed by flonaseFor the pulmonary standpoint defer systemic sarcoid treatmentPlease do not hesitate to call me if you would like to discuss Cathy Evans's care.Sincerely,Shakura Cowing Georgiann Alamo, MD, MScAssistant ProfessorYale Interstitial Lung Disease CenterSection of Pulmonary and Critical Care MedicineYale School of MedicineOn the day of this patient's encounter, a total of 40 minutes was personally spent by me, which includes time spent on chart and records review, review of radiological images, medical consultation, education, coordination of care/services , counseling and chart documentation.This does not include any resident/fellow teaching time, or any time spent performing a procedural service. [1] AllergiesAllergen Reactions  Penicillins Hives   Hives Has patient had a PCN reaction causing immediate rash, facial/tongue/throat swelling, SOB or lightheadedness with hypotension: YESHas patient had a PCN reaction causing severe rash involving mucus membranes or skin necrosis: NOHas patient had a PCN reaction that required hospitalization NOHas patient had a PCN reaction occurring within the last 10 years: NOIf all of the above answers are NO, then may proceed with Cephalosporin use.  Sulfa (Sulfonamide Antibiotics) Hives and Rash  Sulfamethoxazole-Trimethoprim Hives  Sulfasalazine Hives   rashrash  Moxifloxacin Rash

## 2024-02-03 ENCOUNTER — Emergency Department: Admit: 2024-02-03 | Payer: Medicare (Managed Care) | Primary: Internal Medicine

## 2024-02-03 ENCOUNTER — Emergency Department: Admit: 2024-02-03 | Payer: Medicare (Managed Care) | Attending: Diagnostic Radiology | Primary: Internal Medicine

## 2024-02-03 ENCOUNTER — Inpatient Hospital Stay: Admit: 2024-02-03 | Discharge: 2024-02-03 | Payer: Medicare (Managed Care) | Attending: Emergency Medical Services

## 2024-02-03 DIAGNOSIS — R0602 Shortness of breath: Secondary | ICD-10-CM

## 2024-02-03 DIAGNOSIS — D869 Sarcoidosis, unspecified: Secondary | ICD-10-CM

## 2024-02-03 DIAGNOSIS — Z881 Allergy status to other antibiotic agents status: Secondary | ICD-10-CM

## 2024-02-03 DIAGNOSIS — Z880 Allergy status to penicillin: Secondary | ICD-10-CM

## 2024-02-03 DIAGNOSIS — R059 Cough, unspecified: Secondary | ICD-10-CM

## 2024-02-03 DIAGNOSIS — R0609 Other forms of dyspnea: Secondary | ICD-10-CM

## 2024-02-03 DIAGNOSIS — Z882 Allergy status to sulfonamides status: Secondary | ICD-10-CM

## 2024-02-03 LAB — CBC WITH AUTO DIFFERENTIAL
BKR WAM ABSOLUTE IMMATURE GRANULOCYTES.: 0.03 x 1000/ÂµL (ref 0.00–0.30)
BKR WAM ABSOLUTE LYMPHOCYTE COUNT.: 2 x 1000/ÂµL (ref 0.60–3.70)
BKR WAM ABSOLUTE NRBC: 0 x 1000/ÂµL (ref 0.00–1.00)
BKR WAM ANC (ABSOLUTE NEUTROPHIL COUNT): 3.92 x 1000/ÂµL (ref 2.00–7.60)
BKR WAM BASOPHIL ABSOLUTE COUNT.: 0.07 x 1000/ÂµL (ref 0.00–1.00)
BKR WAM BASOPHILS: 0.9 % (ref 0.0–1.4)
BKR WAM EOSINOPHIL ABSOLUTE COUNT.: 1.05 x 1000/ÂµL — ABNORMAL HIGH (ref 0.00–1.00)
BKR WAM EOSINOPHILS: 13.8 % — ABNORMAL HIGH (ref 0.0–5.0)
BKR WAM HEMATOCRIT: 38 % (ref 35.00–45.00)
BKR WAM HEMOGLOBIN: 12.5 g/dL (ref 11.7–15.5)
BKR WAM IMMATURE GRANULOCYTES: 0.4 % (ref 0.0–1.0)
BKR WAM LYMPHOCYTES: 26.3 % (ref 17.0–50.0)
BKR WAM MCH: 27.8 pg (ref 27.0–33.0)
BKR WAM MCHC: 32.9 g/dL (ref 31.0–36.0)
BKR WAM MCV: 84.4 fL (ref 80.0–100.0)
BKR WAM MONOCYTE ABSOLUTE COUNT.: 0.53 x 1000/ÂµL (ref 0.00–1.00)
BKR WAM MONOCYTES: 7 % (ref 4.0–12.0)
BKR WAM MPV: 9.3 fL (ref 8.0–12.0)
BKR WAM NEUTROPHILS: 51.6 % (ref 39.0–72.0)
BKR WAM NUCLEATED RED BLOOD CELLS: 0 % (ref 0.0–1.0)
BKR WAM PLATELETS: 322 x1000/ÂµL (ref 150–420)
BKR WAM RDW-CV: 13.7 % (ref 11.0–15.0)
BKR WAM RED BLOOD CELL COUNT.: 4.5 M/ÂµL (ref 4.00–6.00)
BKR WAM WHITE BLOOD CELL COUNT: 7.6 x1000/ÂµL (ref 4.0–11.0)

## 2024-02-03 LAB — BASIC METABOLIC PANEL
BKR ANION GAP: 12 (ref 7–17)
BKR BLOOD UREA NITROGEN: 10 mg/dL (ref 6–20)
BKR BUN / CREAT RATIO: 18.5 (ref 8.0–23.0)
BKR CALCIUM: 9.9 mg/dL (ref 8.8–10.2)
BKR CHLORIDE: 103 mmol/L (ref 98–107)
BKR CO2: 24 mmol/L (ref 20–30)
BKR CREATININE DELTA: 0.01
BKR CREATININE: 0.54 mg/dL (ref 0.40–1.30)
BKR EGFR, CREATININE (CKD-EPI 2021): >60 mL/min/1.73m2 (ref >=60)
BKR GLUCOSE: 96 mg/dL (ref 70–100)
BKR POTASSIUM: 4.1 mmol/L (ref 3.3–5.3)
BKR SODIUM: 139 mmol/L (ref 136–144)

## 2024-02-03 LAB — NT-PROBNPE: BKR B-TYPE NATRIURETIC PEPTIDE, PRO (PROBNP): <36 pg/mL (ref <125.0)

## 2024-02-03 LAB — TROPONIN T HIGH SENSITIVITY, 0 HOUR BASELINE WITH REFLEX (BH GH LMW YH): BKR TROPONIN T HS 0 HOUR BASELINE: <6 ng/L

## 2024-02-03 LAB — D-DIMER, QUANTITATIVE: BKR D-DIMER: 0.98 mg{FEU}/L — ABNORMAL HIGH (ref <=0.57)

## 2024-02-03 MED ORDER — FLUTICASONE PROPIONATE 110 MCG/ACTUATION HFA AEROSOL INHALER
110 | Freq: Two times a day (BID) | RESPIRATORY_TRACT | 1 refills | 30.00000 days | Status: AC
Start: 2024-02-03 — End: ?

## 2024-02-03 MED ORDER — SODIUM CHLORIDE 0.9 % LARGE VOLUME SYRINGE FOR AUTOINJECTOR
Freq: Once | INTRAVENOUS | Status: CP | PRN
Start: 2024-02-03 — End: ?
  Administered 2024-02-03: 13:00:00 via INTRAVENOUS

## 2024-02-03 MED ORDER — PREDNISONE 10 MG TABLET
10 | ORAL_TABLET | Freq: Every day | ORAL | 1 refills | 30.00000 days | Status: AC
Start: 2024-02-03 — End: ?

## 2024-02-03 MED ORDER — IOHEXOL 350 MG IODINE/ML INTRAVENOUS SOLUTION
350 | Freq: Once | INTRAVENOUS | Status: CP | PRN
Start: 2024-02-03 — End: ?
  Administered 2024-02-03: 13:00:00 350 mL via INTRAVENOUS

## 2024-02-03 MED ORDER — METHYLPREDNISOLONE 40 MG/ML INJECTION
Freq: Once | INTRAVENOUS | Status: DC
Start: 2024-02-03 — End: 2024-02-03

## 2024-02-03 MED ORDER — ALBUTEROL SULFATE HFA 90 MCG/ACTUATION AEROSOL INHALER
90 | Freq: Four times a day (QID) | RESPIRATORY_TRACT | 1 refills | 25.00000 days | Status: AC | PRN
Start: 2024-02-03 — End: ?

## 2024-02-03 NOTE — ED Notes
 11:12 AM Cathy Evans is a 58 y.o. female  who arrived via walk-in triage with complaint nq:Rypzq Complaint Patient presents with  Shortness of Breath   SOB x3 weeks after having an upper respiratory infection. Patient reports increased dyspnea on exertion, improved at rest. Endorses cough, denies fevers/chills or edema. SpO2 100% on RA in triage. Vitals:  02/03/24 1026 02/03/24 1108 BP: (!) 152/85 (!) 150/98 Pulse: 80 88 Resp: 17 20 Temp: 97.1 ?F (36.2 ?C) 98.4 ?F (36.9 ?C) TempSrc: Oral Oral SpO2: 100% 100% Weight:  73.9 kg  Past Medical History, by automated EMR review as listed below.Past Medical History[1] Allergies[2] Pt reports approximately 3 weeks of SOB on exertion. Pt describes it as feeling winded. Pt reports heaviness across her epigastric region and tightness through her midsternum through to the front of her neck with exertion. Pt was seen at pulmonary clinic yesterday. Shift Updates  [1] Past Medical History:Diagnosis Date  Chronic kidney disease   PKD, but normal creatinine  Diabetes mellitus (HC Code)    Encounter for blood transfusion   per pt, no rxn  Eosinophilic esophagitis 08/26/2020  GERD (gastroesophageal reflux disease)   History of torn meniscus of right knee 02/2022  Hypertension   Mitral valve prolapse   Mitral valve regurgitation   Palpitations   Polycystic kidney disease   Sickle cell trait (HC Code)   Natera testing 2021  Vitamin D deficiency  [2] AllergiesAllergen Reactions  Penicillins Hives   Hives Has patient had a PCN reaction causing immediate rash, facial/tongue/throat swelling, SOB or lightheadedness with hypotension: YESHas patient had a PCN reaction causing severe rash involving mucus membranes or skin necrosis: NOHas patient had a PCN reaction that required hospitalization NOHas patient had a PCN reaction occurring within the last 10 years: NOIf all of the above answers are NO, then may proceed with Cephalosporin use.  Sulfa (Sulfonamide Antibiotics) Hives and Rash  Sulfamethoxazole-Trimethoprim Hives  Sulfasalazine Hives   rashrash  Moxifloxacin Rash

## 2024-02-03 NOTE — Consults
 Brief Pulm Note Cathy Evans is a 58 y.o. 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 On [place] scan she has no evidence of parenchymal involvement from sarcoidosis. [place] Scan : IMPRESSION: 1.No evidence of acute pulmonary embolism.2. Redemonstration of well circumscribed perilymphatic and perifissural nodules corresponding to patient's known sarcoidosis. Similar mediastinal adenopathy.3. In addition new fluffy nodular opacities throughout both lungs have a slightly different morphology and favored to be infectious/inflammatory. Correlate clinically for any respiratory infection. A short-term interval follow-up [place] chest can be obtained to assess for improvement/resolution.-patient not currently on treatment for sarcoid, per chart review seen yesterday rx ics/lama by pulm  RECOMMENDATIONS:SarcoidosisPathology: EBUS with FNA showing non caseating granulomasOrgan involvement: LADsPrimary target organ for therapy: none currentlyCurrent treatment: nonePast treatments: noneFurther work-up:Suspect [place] scan findings likely 2/2 to recent viral illnessPatient open to Pred 30 x 7 daysSent home on albuterol and Flovent  until ICS/LABA can be covered by insuranceFollow up in 3 weeks with outpatient pulm

## 2024-02-03 NOTE — Discharge Instructions
 You were evaluated in the emergency department for shortness of breath.  Your workup today was reassuring, no signs of infection, no evidence of blood clot in your lungs. Please start the inhaled corticosteroid and LAMA prescribed by your pulmonologist and contact them for follow up.Please return to the emergency room or call 911 if your symptoms continue to get worse, you develop chest pain, shortness of breath, dizziness, blurry vision, or any other concerning symptoms.In the medical field, there is always a level of diagnostic uncertainty, even if this uncertainty is low. For this reason, it is important to immediately return to the emergency department if you have any new symptoms, worsening symptoms, change of symptoms, or if you have any other concerns. We would be happy to re-evaluate you. Otherwise, please take your medications as prescribed and follow-up as recommended. Please take this paperwork to your primary care provider to further discuss any non-emergent lab and imaging findings.

## 2024-02-10 NOTE — ED Provider Notes
 Chief Complaint Patient presents with  Shortness of Breath   SOB x3 weeks after having an upper respiratory infection. Patient reports increased dyspnea on exertion, improved at rest. Endorses cough, denies fevers/chills or edema. SpO2 100% on RA in triage. Emergency Medicine Resident Note DISCLAIMER: This chart was created using M-Modal dictation software, and I have personally reviewed the dictation below for accuracy. However, errors may still be present when words are incorrectly transcribed due to limitations of the software.Presentation: Cathy Evans is a 58 y.o. female with PMHx of sarcoidosis, CKD, GERD presenting with shortness of breath. Patient reports DOE, associated with dry cough for the past 3 weeks, started after URI sxs. Reports is currently being treated for a UTI. Denies chest pain, fevers, nausea, vomiting, diarrhea.Physical Exam FindingsGen:alert and oriented, in no acute distressCard: RRRPulm: lungs clear b/l, no wheezes, crackles, dyspneic after speakingAbdomen: soft non tenderMSK: No LE edema or calf tendernessDdx/MDM: Highest concern for PE vs pericarditis vs myocarditis vs pericardial effusionAlso considering worsening ILD-CBC/BMP-BNP-EKG/trops-CTA-PE-echoED Course:-bedside echo with good ef, no significant effusion, no rv strain-BNP <36, first top <6-no electrolyte derangement, -CTA-PEIMPRESSION: 1.No evidence of acute pulmonary embolism.2. Redemonstration of well circumscribed perilymphatic and perifissural nodules corresponding to patient's known sarcoidosis. Similar mediastinal adenopathy.3. In addition new fluffy nodular opacities throughout both lungs have a slightly different morphology and favored to be infectious/inflammatory. Correlate clinically for any respiratory infection. A short-term interval follow-up [place] chest can be obtained to assess for improvement/resolution.-patient not currently on treatment for sarcoid, per chart review seen yesterday rx ics/lama by pulmThis patient was presented to and cared for under the guidance of Attending Provider: Syble Comer Caprice Claudene, MD the attending physician and a treatment plan and disposition were collaboratively agreed upon. Please see comments section for additional consults and disposition.Tillman Re PA-CEmergency Medicine PA ResidentPatient signed out to Dr. Judah pending pulm recsMDM  Physical ExamED Triage Vitals [02/03/24 1026]BP: (!) 152/85Pulse: 80Pulse from  O2 sat: n/aResp: 17Temp: 97.1 ?F (36.2 ?C)Temp src: OralSpO2: 100 % BP 129/76  - Pulse 81  - Temp 98.1 ?F (36.7 ?C) (Oral)  - Resp 18  - Wt 73.9 kg  - LMP 08/30/2017 (Exact Date) Comment: hyst 08/30/2017 - SpO2 98%  - BMI 28.52 kg/m? Physical Exam ProceduresAttestation/Critical CarePatient Reevaluation: ED Attestation: PA/APRNFace to face evaluation was performed by me in collaboration with the Advanced Practice Provider to assess for significant health threats. I provided a substantive portion of the care of this patient.  I personally performed the History, Exam, and MDM 58 y/o female with PMH sarcoid, recent URI, now with cough, DOE. No cp/fevers. On my exam:  Awake, alert, no acute distress, no respiratory distress, lungs clear to auscultation bilaterally, no significant lower extremity edema, regular rate andMy differential includes:post viral dyspnea, sarcoid sequella, viral myocarditis, consider acs, consider but less likely pnaOutside notes reviewed: Reviewed last available outside notes- confirmed PMH. On my review of EKG:  Sinus rhythmOn my review of labs:  D-dimer chronically elevated,On my review of imaging:  Echo without pericardial effusion, normal EF, no evidence of right heart strain------------------------------------Delsin Copen, MD/MPHYale Emergency MedicineComments as of 02/10/24 1757 Fri Feb 03, 2024 1510 @@@@@@@@@@@@@@@@@@@@@@@@@@@@@@@@@@3 :10 PM took over careSOB and CP on exertion with sarcoid.[place] PE concerning for new fluffy nodular opacities throughout both lungs have a slightly different morphology and favored to be infectious/inflammatory. Correlate clinically for any respiratory infection. A short-term interval follow-up [place] chest can be obtained to assess for improvement/resolution.Pulm consult pending.Alfornia Judah, FI@@@@@@@@@@@@@@@@@@@@@@@@@@@@@@@@@@ [AT] [phone] Signed out to me, hx of  sarcoid, pending pulm recs. [HP]  Comments User Index[AT] Judah Nakai, MD[HP] Pol, Powell Jansky, DO   Clinical Impressions as of 02/10/24 1757 Shortness of breath History of sarcoidosis  ED DispositionDischarge  Kemonie Cutillo, Comer Caprice Sharps, MD09/26/25 1437 Renny Hurl, PA09/26/25 1536 Marleena Shubert, [place] Caprice Sharps, MD10/03/25 [phone]

## 2024-02-11 ENCOUNTER — Inpatient Hospital Stay: Admit: 2024-02-11 | Discharge: 2024-02-11 | Payer: PRIVATE HEALTH INSURANCE | Primary: Internal Medicine

## 2024-02-13 ENCOUNTER — Inpatient Hospital Stay: Admit: 2024-02-13 | Discharge: 2024-02-13 | Payer: Medicare (Managed Care) | Primary: Internal Medicine

## 2024-02-13 DIAGNOSIS — D869 Sarcoidosis, unspecified: Principal | ICD-10-CM

## 2024-02-13 DIAGNOSIS — K831 Obstruction of bile duct: Secondary | ICD-10-CM

## 2024-02-13 DIAGNOSIS — E119 Type 2 diabetes mellitus without complications: Secondary | ICD-10-CM

## 2024-02-13 DIAGNOSIS — K753 Granulomatous hepatitis, not elsewhere classified: Secondary | ICD-10-CM

## 2024-02-13 LAB — HEPATIC FUNCTION PANEL
BKR A/G RATIO: 1 (ref 1.0–2.2)
BKR ALANINE AMINOTRANSFERASE (ALT): 127 U/L — ABNORMAL HIGH (ref 10–35)
BKR ALBUMIN: 3.9 g/dL (ref 3.6–5.1)
BKR ALKALINE PHOSPHATASE: 179 U/L — ABNORMAL HIGH (ref 9–122)
BKR ASPARTATE AMINOTRANSFERASE (AST): 72 U/L — ABNORMAL HIGH (ref 10–35)
BKR AST/ALT RATIO: 0.6
BKR BILIRUBIN DIRECT: 0.2 mg/dL (ref <=0.2)
BKR BILIRUBIN TOTAL: 0.7 mg/dL (ref <=1.2)
BKR GLOBULIN: 4 g/dL — ABNORMAL HIGH (ref 2.0–3.9)
BKR PROTEIN TOTAL: 7.9 g/dL (ref 5.9–8.3)

## 2024-02-13 LAB — HEMOGLOBIN A1C
BKR ESTIMATED AVERAGE GLUCOSE: 120 mg/dL
BKR HEMOGLOBIN A1C: 5.8 % — ABNORMAL HIGH (ref 4.0–5.6)

## 2024-02-14 ENCOUNTER — Encounter: Admit: 2024-02-14 | Payer: PRIVATE HEALTH INSURANCE | Attending: Gastroenterology | Primary: Internal Medicine

## 2024-02-16 ENCOUNTER — Inpatient Hospital Stay: Admit: 2024-02-16 | Discharge: 2024-02-16 | Payer: Medicare (Managed Care) | Primary: Internal Medicine

## 2024-02-16 DIAGNOSIS — R9389 Abnormal findings on diagnostic imaging of other specified body structures: Principal | ICD-10-CM

## 2024-02-16 DIAGNOSIS — J069 Acute upper respiratory infection, unspecified: Secondary | ICD-10-CM

## 2024-02-16 LAB — CBC WITH AUTO DIFFERENTIAL
BKR WAM ABSOLUTE IMMATURE GRANULOCYTES.: 0.03 x 1000/ÂµL (ref 0.00–0.30)
BKR WAM ABSOLUTE LYMPHOCYTE COUNT.: 2.2 x 1000/ÂµL (ref 0.60–3.70)
BKR WAM ABSOLUTE NRBC: 0 x 1000/ÂµL (ref 0.00–1.00)
BKR WAM ANC (ABSOLUTE NEUTROPHIL COUNT): 3.33 x 1000/ÂµL (ref 2.00–7.60)
BKR WAM BASOPHIL ABSOLUTE COUNT.: 0.08 x 1000/ÂµL (ref 0.00–1.00)
BKR WAM BASOPHILS: 1.1 % (ref 0.0–1.4)
BKR WAM EOSINOPHIL ABSOLUTE COUNT.: 0.94 x 1000/ÂµL (ref 0.00–1.00)
BKR WAM EOSINOPHILS: 12.6 % — ABNORMAL HIGH (ref 0.0–5.0)
BKR WAM HEMATOCRIT: 39.9 % (ref 35.00–45.00)
BKR WAM HEMOGLOBIN: 13.3 g/dL (ref 11.7–15.5)
BKR WAM IMMATURE GRANULOCYTES: 0.4 % (ref 0.0–1.0)
BKR WAM LYMPHOCYTES: 29.5 % (ref 17.0–50.0)
BKR WAM MCH: 28.7 pg (ref 27.0–33.0)
BKR WAM MCHC: 33.3 g/dL (ref 31.0–36.0)
BKR WAM MCV: 86 fL (ref 80.0–100.0)
BKR WAM MONOCYTE ABSOLUTE COUNT.: 0.88 x 1000/ÂµL (ref 0.00–1.00)
BKR WAM MONOCYTES: 11.8 % (ref 4.0–12.0)
BKR WAM MPV: 9.8 fL (ref 8.0–12.0)
BKR WAM NEUTROPHILS: 44.6 % (ref 39.0–72.0)
BKR WAM NUCLEATED RED BLOOD CELLS: 0 % (ref 0.0–1.0)
BKR WAM PLATELETS: 280 x1000/ÂµL (ref 150–420)
BKR WAM RDW-CV: 14.2 % (ref 11.0–15.0)
BKR WAM RED BLOOD CELL COUNT.: 4.64 M/ÂµL (ref 4.00–6.00)
BKR WAM WHITE BLOOD CELL COUNT: 7.5 x1000/ÂµL (ref 4.0–11.0)

## 2024-02-16 LAB — URINALYSIS-MACROSCOPIC W/REFLEX MICROSCOPIC
BKR BILIRUBIN, UA: NEGATIVE
BKR GLUCOSE, UA: NEGATIVE
BKR KETONES, UA: NEGATIVE
BKR NITRITE, UA: POSITIVE — AB
BKR PH, UA: 6 (ref 5.5–7.5)
BKR PROTEIN, UA: NEGATIVE
BKR SPECIFIC GRAVITY, UA: 1.015 (ref 1.005–1.030)
BKR UROBILINOGEN, UA: <2 mg/dL (ref <=2.0)

## 2024-02-16 LAB — URINE MICROSCOPIC     (BH GH LMW YH)
BKR HYALINE CASTS, UA (INSTRUMENT): 2 /LPF (ref 0–3)
BKR RBC/HPF, UA (INSTRUMENT): 6 /HPF — ABNORMAL HIGH (ref 0–2)
BKR URINE SQUAMOUS EPITHELIAL CELLS, UA (INSTRUMENT): 1 /HPF (ref 0–5)
BKR WBC/HPF, UA (INSTRUMENT): 12 /HPF — ABNORMAL HIGH (ref 0–5)

## 2024-02-16 LAB — PROCALCITONIN     (BH GH LMW Q YH): BKR PROCALCITONIN: 0.1 ng/mL

## 2024-02-16 LAB — SEDIMENTATION RATE (ESR): BKR SEDIMENTATION RATE, ERYTHROCYTE: 59 mm/h — ABNORMAL HIGH (ref 0–20)

## 2024-02-16 LAB — SARS-COV-2 (COVID-19)/INFLUENZA A+B/RSV BY RT-PCR (BH GH LMW YH)
BKR INFLUENZA A: NEGATIVE
BKR INFLUENZA B: NEGATIVE
BKR RESPIRATORY SYNCYTIAL VIRUS: NEGATIVE
BKR SARS-COV-2 RNA (COVID-19) (YH): NEGATIVE

## 2024-02-16 LAB — C-REACTIVE PROTEIN     (CRP): BKR C-REACTIVE PROTEIN, HIGH SENSITIVITY: 52 mg/L — ABNORMAL HIGH

## 2024-03-02 ENCOUNTER — Encounter
Admit: 2024-03-02 | Payer: PRIVATE HEALTH INSURANCE | Attending: Student in an Organized Health Care Education/Training Program | Primary: Internal Medicine

## 2024-03-02 MED ORDER — FLUTICASONE PROPIONATE 110 MCG/ACTUATION HFA AEROSOL INHALER
110 | Freq: Two times a day (BID) | RESPIRATORY_TRACT | 4 refills | Status: AC
Start: 2024-03-02 — End: ?

## 2024-03-02 NOTE — Telephone Encounter [36]
 Pharmacy requesting refill. Please reviewLast visit 9/25/2025Next visit 03/05/2024

## 2024-03-05 ENCOUNTER — Ambulatory Visit
Admit: 2024-03-05 | Payer: PRIVATE HEALTH INSURANCE | Attending: Student in an Organized Health Care Education/Training Program | Primary: Internal Medicine

## 2024-03-13 ENCOUNTER — Encounter: Admit: 2024-03-13 | Payer: PRIVATE HEALTH INSURANCE | Primary: Internal Medicine

## 2024-03-14 ENCOUNTER — Encounter: Admit: 2024-03-14 | Payer: PRIVATE HEALTH INSURANCE | Primary: Internal Medicine

## 2024-03-15 ENCOUNTER — Ambulatory Visit
Admit: 2024-03-15 | Payer: PRIVATE HEALTH INSURANCE | Attending: Student in an Organized Health Care Education/Training Program | Primary: Internal Medicine

## 2024-03-15 ENCOUNTER — Encounter
Admit: 2024-03-15 | Payer: PRIVATE HEALTH INSURANCE | Attending: Student in an Organized Health Care Education/Training Program | Primary: Internal Medicine

## 2024-03-15 VITALS — BP 132/88 | HR 72 | Temp 97.70000°F | Resp 18 | Ht 63.0 in | Wt 163.8 lb

## 2024-03-15 DIAGNOSIS — Z87828 Personal history of other (healed) physical injury and trauma: Secondary | ICD-10-CM

## 2024-03-15 DIAGNOSIS — D869 Sarcoidosis, unspecified: Secondary | ICD-10-CM

## 2024-03-15 DIAGNOSIS — I10 Essential (primary) hypertension: Secondary | ICD-10-CM

## 2024-03-15 DIAGNOSIS — E559 Vitamin D deficiency, unspecified: Secondary | ICD-10-CM

## 2024-03-15 DIAGNOSIS — N189 Chronic kidney disease, unspecified: Principal | ICD-10-CM

## 2024-03-15 DIAGNOSIS — E119 Type 2 diabetes mellitus without complications: Secondary | ICD-10-CM

## 2024-03-15 DIAGNOSIS — K200 Eosinophilic esophagitis: Secondary | ICD-10-CM

## 2024-03-15 DIAGNOSIS — K219 Gastro-esophageal reflux disease without esophagitis: Secondary | ICD-10-CM

## 2024-03-15 DIAGNOSIS — Z5189 Encounter for other specified aftercare: Secondary | ICD-10-CM

## 2024-03-15 DIAGNOSIS — R002 Palpitations: Secondary | ICD-10-CM

## 2024-03-15 DIAGNOSIS — D573 Sickle-cell trait: Secondary | ICD-10-CM

## 2024-03-15 DIAGNOSIS — Q613 Polycystic kidney, unspecified: Secondary | ICD-10-CM

## 2024-03-15 DIAGNOSIS — I341 Nonrheumatic mitral (valve) prolapse: Secondary | ICD-10-CM

## 2024-03-15 DIAGNOSIS — I340 Nonrheumatic mitral (valve) insufficiency: Secondary | ICD-10-CM

## 2024-03-15 MED ORDER — PREDNISONE 20 MG TABLET
20 | ORAL_TABLET | ORAL | 1 refills | 30.00000 days | Status: AC
Start: 2024-03-15 — End: ?

## 2024-03-15 NOTE — Progress Notes [1]
 [facility] [employer] [facility] [employer], Section of Pulmonary, Critical Care, and Sleep Medicine	[facility] [address];  [place], [place] [address]Eynwz:  [phone]  Fax: [phone]				Director, Edsel Mays, MD			Associate Director, Dennard Lockwood, MD, [facility] Georgiann Alamo, MD, MScAlexander Sylacauga, MDErica Whitman, MD, PhDJean Deward Square, MDGenta Ishikawa, MD, MPHChangwan Arnoldo Luster, MD, MPHJonathan Siner, MDRachel Marine, PALisa Rebmann, RNChristine Tancreti, RNPatient Name:  Cathy Evans of Birth:  Aug 18, 1967Date of Service: 11/6/2025EPIC MRN:  [id number] Provider:  Gaylia BROCKS Cerro-Chiang, MDHPI:She was found to have mediastinal adenopathies on [place] chest as part of the workup for a neck mass. 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 At our first visit, she had some CTD related symptoms as well as palpitations. She had a Holter monitor for 2 weeks which was unrevealing. 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 Given increased nodularity on [place] scan and worsening symptoms we discussed the role of treatment in March but she declined treatment.LAST VISIT 9/25/25Since her last visit, Cathy Evans reports worsening SOB on ambulation, started again about 3 weeks ago, and coincided with getting a cold 3-4 weeks ago. She has no orthopnea and no cough. She has noticed occasional palpitations. No edema.Started LAMA/ICS for RAD secondary to viral infection.INTERIM HISTORY:02/03/24 ED visit for SOB, agreeable on prednisone  30mg  for 7 days.02/22/24 televisit ([facility]) for shortness of breath. Repeat chest [place] ordered and restarted on prednisone . Referred to [facility] and a trial of MiraLax for constipation. Not on system therapy for sarcoid.CURRENT VISIT 03/15/2024:Since last visit, Cathy Evans reports that after she took prednisone  for 7 days the cough got better, and even her breathing improved for a couple of days, however she is now persistently winded again. MEDICATIONS: Current Outpatient Medications:   albuterol sulfate, Inhale 1-2 puffs into the lungs every 6 (six) hours as needed for wheezing.  ascorbic acid (vitamin C), 1 tab(s) orally once a day; Duration: 30 day(s)  OneTouch Ultra2 Meter, USE TO CHECK BLOOD SUGAR DAILY E11.9  glucose blood, Testing Frequency: Three times daily DX E11.9. Accucheck test strips.  fluticasone  propion-salmeteroL, Inhale 1 puff into the lungs every 12 (twelve) hours. (Patient not taking: Reported on 02/16/2024)  fluticasone  propionate, Inhale 2 puffs into the lungs 2 (two) times daily.  predniSONE , Take 1 tablet (20 mg total) by mouth daily for 45 days, THEN 0.5 tablets (10 mg total) daily. Take with food.  Mounjaro, INJECT 1 PEN UNDER THE SKIN ONE TIME PER WEEK  ursodioL , Take 1 tablet (500 mg total) by mouth 2 (two) times daily.ALLERGIES:Allergies[1]PAST MEDICAL HISTORY:Past Medical History: Diagnosis Date  Chronic kidney disease   PKD, but normal creatinine  Diabetes mellitus (HC CODE)   Encounter for blood transfusion   per pt, no rxn  Eosinophilic esophagitis 08/26/2020  GERD (gastroesophageal reflux disease)   History of torn meniscus of right knee 02/2022  Hypertension   Mitral valve prolapse   Mitral valve regurgitation   Palpitations   Polycystic kidney disease   Sickle cell trait (HC Code)   Natera testing 2021  Vitamin D deficiency  Past Surgical History: Procedure Laterality Date  APPENDECTOMY  1984  BRONCHOSCOPY    w/ EBUS  COLONOSCOPY    DILATION AND CURETTAGE OF UTERUS    HYSTERECTOMY  08/30/2017  LAPAROSCOPY    LEEP    TONSILLECTOMY    TUBAL LIGATION    prior to the hysterectomy  UPPER GASTROINTESTINAL ENDOSCOPY   PHYSICAL EXAMINATION:Vital signs:  BP 132/88 (Site: r a, Position: Sitting, Cuff  Size: Medium)  - Pulse 72  - Temp 97.7 ?F (36.5 ?C) (Temporal)  - Resp 18  - Ht 5' 3 (1.6 m)  - Wt 74.3 kg  - LMP 08/30/2017 (Exact Date) Comment: hyst 08/30/2017 - SpO2 99%  - BMI 29.02 kg/m?  Weight 163.8 lbs. on room airGeneral: well appearing, well developed, well nourished, and no apparent respiratory distressHEENT:  normocephalic and atraumaticEyes:  sclera anicteric, PERRL, extraoccular movements intact, and normal conjunctivaNeck:  supple, no palpable lymphadenopathy, no  thyromegaly, and no JVD Cardiovascular:  regular rate and rhythm, normal S1 and S2, and no murmurs rubs or gallopsRespiratory: chest with normal AP diameter, no retractions, clear to auscultation bilaterally, normal percussion, no wheezing , and no ronchiGastrointestinal: nontender, nondistended, and no massesExtremities:  no cyanosis, no clubbing, no edema, and no inflammatory or deforming arthritis Musculoskeletal: normal bulk, normal tone, normal strength, and no spinal tenderness   Hematologic/Lymph: no lymphadenopathy and no ecchymosesSkin: skin intact, no rash, no skin thickening, no sclerodactyly, no Raynaud's, and no digital ulceration  Neuro:  no gross focal deficits, PERRL, gait normal, and extraoccular movements intactPsychiatric: normal mood and affect  REVIEW OF DATA:Pulmonary Function Testing: Date FVC FEV1 FEV1/FVC TLC DLCO  01/21/2022 2.46 (73%) 1.96 (73%) 80% 3.44 (66%) 15.95 (78%) 02/04/23 2.02 (65) 1.62 (64) 80% 3.35 (64) 16.33 (81) 06/09/23 2.17 (71) 1.62 (66) 75% 2.85 (56) 17.06 (85) 02/02/24 2.17 (73) 1.51 (63) 70%   15.58 (79)  Personal Interpretation:  No obstruction, moderate restriction, normal DLCO. FVC and DLCO are improved Chest Radiology: 10/18/21 [facility] ChestLungs/Airways/Pleura: Central tracheobronchial tree is patent. Minimal tree-in-bud and groundglass opacities in the right lower lobe, likely infectious or inflammatory etiology (4:367). No reticulations, bronchiectasis, honeycombing, architectural distortion or other evidence of interstitial lung disease. No suspicious pulmonary nodules or masses. No pleural effusion or pneumothorax.Mediastinum/Lymph nodes: Left subclavicular, superior mediastinal, right paratracheal, prevascular, bilateral hilar and subcarinal adenopathy. Largest nodes measure up to 14 mm in the subcarinal region. [place] Chest 4/22/24Slight increase in the size of mediastinal adenopathy compared to study from June 2023. No change in lung findings including a few perilymphatic nodules. No significant bronchiectasis or architectural distortion in the lungs.  HRCT - 07/03/23 - Worsening pleuropulmonary findings related to known sarcoidosis with no true parenchymal progression. Unchanged thoracic adenopathy.  Other Radiology/Studies: AP 6/23/23The liver, gallbladder, spleen, pancreas, and adrenal glands are unremarkable, except for tiny hepatic cysts. Again seen are multiple bilateral renal cysts and hypodensities too small to characterize, including a 2.9 cm lesion in the right kidney that likely represents a cyst with new hemorrhagic/proteinaceous debris (image 39, series 2) that has increased from 2.0 cm. There is no bowel obstruction, ascites, or lymphadenopathy. No aggressive osseous lesions are seen.  Pathology:7/11/23Lung, right mainstem, endobronchial biopsy:       - Superficial fragments of bronchial mucosa with no significant abnormality.  FNA lung1.  RIGHT MIDDLE LOBE BRONCHOALVEOLAR LAVAGE:      - NEGATIVE FOR MALIGNANT CELLS.      - REACTIVE PULMONARY MACROPHAGES.      - GMS STAIN IS NEGATIVE FOR FUNGUS/PJP.      - AFB STAIN IS NEGATIVE FOR MYCOBACTERIA. 2.  LYMPH NODE, 4R, FINE NEEDLE ASPIRATION/WASH:      - NEGATIVE FOR MALIGNANT CELLS.           - REACTIVE LYMPHOCYTES AND FOCAL LYMPHOHISTIOCYTIC AGGREGATES. 3.  LYMPH NODE, 10R, FINE NEEDLE ASPIRATION/WASH:      - NEGATIVE FOR MALIGNANT CELLS.           -  REACTIVE LYMPHOCYTES AND FOCAL LYMPHOHISTIOCYTIC AGGREGATES. 4.  LYMPH NODE, 4L, FINE NEEDLE ASPIRATION/WASH:      - NEGATIVE FOR MALIGNANT CELLS.      - FOCAL NON-CASEATING GRANULOMATOUS INFLAMMATION.      - GMS STAIN IS NEGATIVE FOR FUNGUS/PJP.      - AFB STAIN IS NEGATIVE FOR MYCOBACTERIA.  Cardiac studies:07/11/20 * Normal left ventricular size, wall thickness, systolic function and wall motion. LVEF calculated by 3DE was 69%.  Diastolic function was difficult to determine due to mitral valve abnormalities.* Normal right ventricular cavity size and systolic function.  Right ventricular systolic pressure is unable to be estimated due to insufficient Doppler signal.* Atria are normal in size.  No interatrial shunt by color Doppler.  Interatrial septum is bowed toward the left, consistent with elevated right atrial pressure.* Normal mitral valve leaflets.  Moderate posteriorly directed mitral regurgitation. By PISA, EROA PISA is 0.24 cm2 and regurgitant volume 46 ml.* All visible segments of the aorta are normal in size.* IVC diameter < 2.1 cm that collapses > 50% with a sniff suggests normal RAP (0-5 mmHg, mean 3 mmHg). Echo 03/24/23 * Normal left ventricular size, systolic function and wall motion. Mild concentric left ventricular hypertrophy.  LVEF calculated by 3DE was 63%.  Normal global longitudinal strain (-21%).  Normal diastolic function and filling pressures.* Normal right ventricular cavity size and systolic function.  Estimated right ventricular systolic pressure is 26 mmHg.* Mild mitral regurgitation.* Mild tricuspid regurgitation.* Normal sinuses of Valsalva with a diameter of 2.9 cm and normal ascending aorta with a diameter of 3.0 cm.* IVC diameter < 2.1 cm that collapses > 50% with a sniff suggests normal RAP (0-5 mmHg, mean 3 mmHg). 11/07/23 Myocardial PET  * 1) Normal fasting FDG PET with rest RB-82 myocardial perfusion imaging for the evaluation of cardiac sarcoidosis but with extensive non-cardiac FDG uptake (see below)* 2) There was no myocardial FDG uptake.  Perfusion imaging was normal.* 3) Global left ventricular systolic function was normal and normal regional wall motion.  The left ventricle was normal in size.* 4) Rennert performed for attenuation correction showed no significant coronary artery calcifications.  Non-cardiac findings of the [place] are described in a separate report from Radiology. Labs:10/2021:CRP 5.1, ESR 34Neg ACE, RFNormal complementIgG: 1625, remainder unremarkableANA > 1:2560 (nuclear dots) 1:160 speckled. DsDNA, RNP, SSA, SSB, Smith, all negative 12/2019ANA     1:1280 nuclear, multiple nuclear dotsdsDNA Negative Scl-70  Negative SSA     NegativeSSB     NegativeJo-1     NegativeCCP     <16RF IgM            10ANCA  NegativeSm Ab NegativeMito Ab            Negative C3        161C4        42 6/3/25ACE 109IgG4 42.7CRP 13.2GGT 149IgG 1648IgA 592Alk phos 167ALT 43AST 47ESR 61CK 54 IMPRESSION:Cathy Evans is a 58 y.o. 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 She has parenchymal findings of sarcoidosis as well as worsening symptoms that improved with prednisone   RECOMMENDATIONS:SarcoidosisPathology: EBUS with FNA showing non caseating granulomasOrgan involvement: LADsPrimary target organ for therapy: none currentlyCurrent treatment: nonePast treatments: noneFurther work-up:Continue ICS/LABA today for reactive airway disease likely secondary to viral infection. Start prednisone  20mg  for 6 weeks, followed by 10mg  for 6  weeks. Will repeat [place] scan in 3 months to evaluate her parenchymaAnnual vitamin D levels. Continue  daily sinus rinses followed by flonase  Please do not hesitate to call me if you would like to discuss Cathy Evans's care. Sincerely, Gaylia Georgiann Alamo, MD, MScAssistant ProfessorYale Interstitial Lung Disease CenterSection of Pulmonary and Critical Care MedicineYale School of MedicineToday's visit required high medical decision making.  [1] AllergiesAllergen Reactions  Penicillins Hives   Hives Has patient had a PCN reaction causing immediate rash, facial/tongue/throat swelling, SOB or lightheadedness with hypotension: YESHas patient had a PCN reaction causing severe rash involving mucus membranes or skin necrosis: NOHas patient had a PCN reaction that required hospitalization NOHas patient had a PCN reaction occurring within the last 10 years: NOIf all of the above answers are NO, then may proceed with Cephalosporin use.  Sulfa (Sulfonamide Antibiotics) Hives and Rash  Sulfamethoxazole-Trimethoprim Hives  Sulfasalazine Hives   rashrash  Moxifloxacin Rash

## 2024-03-15 NOTE — Patient Instructions [37]
 It was a pleasure to meet you in clinic today.  Today we discussed your diagnosis of sarcoidosisThe immediate plan includes the following studies and/or referrals:Prednisone  20mg  for 6weeks then 10mg  for 6 weeksWe'll touch base in 6 weeks to see how you are doingPlease keep me updated on any changes in your breathing and any acute illnesses, particularly if hospitalization is required. Our goal at the [facility] is to provide you comprehensive care and support from diagnosis onward.[facility] for [facility] Disease: [phone] Function [facility]: [phone] Radiology (to schedule [place] scans): [phone]

## 2024-04-02 ENCOUNTER — Encounter: Admit: 2024-04-02 | Payer: PRIVATE HEALTH INSURANCE | Attending: Gastroenterology | Primary: Internal Medicine

## 2024-04-02 ENCOUNTER — Ambulatory Visit: Admit: 2024-04-02 | Payer: PRIVATE HEALTH INSURANCE | Attending: Gastroenterology | Primary: Internal Medicine

## 2024-04-02 VITALS — BP 145/90 | HR 65 | Temp 98.10000°F | Ht 63.0 in | Wt 164.0 lb

## 2024-04-02 DIAGNOSIS — D869 Sarcoidosis, unspecified: Secondary | ICD-10-CM

## 2024-04-02 DIAGNOSIS — Q613 Polycystic kidney, unspecified: Secondary | ICD-10-CM

## 2024-04-02 DIAGNOSIS — K219 Gastro-esophageal reflux disease without esophagitis: Secondary | ICD-10-CM

## 2024-04-02 DIAGNOSIS — I340 Nonrheumatic mitral (valve) insufficiency: Secondary | ICD-10-CM

## 2024-04-02 DIAGNOSIS — R748 Abnormal levels of other serum enzymes: Principal | ICD-10-CM

## 2024-04-02 DIAGNOSIS — E559 Vitamin D deficiency, unspecified: Secondary | ICD-10-CM

## 2024-04-02 DIAGNOSIS — Z5189 Encounter for other specified aftercare: Secondary | ICD-10-CM

## 2024-04-02 DIAGNOSIS — N189 Chronic kidney disease, unspecified: Principal | ICD-10-CM

## 2024-04-02 DIAGNOSIS — I10 Essential (primary) hypertension: Secondary | ICD-10-CM

## 2024-04-02 DIAGNOSIS — D573 Sickle-cell trait: Secondary | ICD-10-CM

## 2024-04-02 DIAGNOSIS — I341 Nonrheumatic mitral (valve) prolapse: Secondary | ICD-10-CM

## 2024-04-02 DIAGNOSIS — E119 Type 2 diabetes mellitus without complications: Secondary | ICD-10-CM

## 2024-04-02 DIAGNOSIS — Z87828 Personal history of other (healed) physical injury and trauma: Secondary | ICD-10-CM

## 2024-04-02 DIAGNOSIS — R002 Palpitations: Secondary | ICD-10-CM

## 2024-04-02 DIAGNOSIS — K200 Eosinophilic esophagitis: Secondary | ICD-10-CM

## 2024-04-04 NOTE — Progress Notes [1]
 [facility] HepatologyDavid Henderson, MD Ophelia Reese, MD Marina  Madelynn, MD Francisca Dessert, MD Derrek Dunnings, MD Bette Squire, MD Delon Iba, MD Sinclair Mcardle, MD Achille Jetty, MD Lynwood Poli, MD Elna Pacas, MD Roma Fisherman, MD Rodgers Mau, MD Janetta Harding, MD Auston Hug, MD Clem Has, MD Eather Human, MD Mickel Lipps, APRN Sherline Miners, MD Ozell Bloodgood, MD [facility] APRN Ephriam Bring, MD Ozell Bottom, MD Deland Sor, PA-C    Patient Name: Tekelia Kareem                DOB: 1966-03-28              MRN: [id number]   11/24/2025Subjective:    Dyneisha Murchison is a 58 y.o. with a history of hypertension, prediabetes, elevated BMI and sarcoidosis who has abnormal LFTs.The patient is seen today November 24th in the follow-up of cholestatic abnormal LFTs.  She recently started prednisone  for systemic sarcoidosis, having been initially against the idea of treatment earlier this year.  In particular she started prednisone  20 mg taper after 6 weeks to 10 mg with the plan for repeat surveillance [place] scan thereafter.  Interim FibroScan September 2025 since her initial office appointment showed minimal steatosis (S0) and fibroscore 13 KPA indicative of a moderate to high-risk of fibrosis.Initial hepatology consultation 12/30/2023  for mixed cholestatic LFT abnormalities in a background history of sarcoidosis, type 2 diabetes, HTN.  The sarcoidosis was diagnosed approximately a year or more ago.  Cross-sectional imaging of the abdomen revealed periportal and perigastric adenopathy.   [place] scan from 2024 and 2025 subcentimeter lesions, originally deemed hamartomas.  There were no features of lobularity of the liver capsule or hypertrophy of the caudate lobe or varices.  [place] scan of the chest as recent as February 2025 revealed pleural pulmonary features of sarcoidosis.  EUS May 2025 biopsy of periportal lymph node showed small noncaseating granulomata. She noted occasional shortness of breath with activity otherwise no baseline dyspnea.  There have been no complaints of cough, nasal congestion, lymphadenopathy or fever.  That said she does have occasional night sweats and hot flashes.She denied liver specific symptoms as she denies jaundice, scleral icterus pruritus, right upper quadrant pain, acholic stool or dark urine.  She does note occasional epigastric bloating.  She has an elevated ACE level.  Whereas LFTs were normal in early 2024, the latest liver function tests from 2025 show elevations of AST, ALT, alkaline phosphatase and GGT.  The values of total bilirubin, protein and albumin were normal.  Viral serologies were negative.  Autoimmune markers include an elevated IgA, borderline elevated IgG, elevated ANA and antimitochondrial antibody AMA.  She has a history of UCTD treated with Plaquenil .There is no history of alcohol, blood transfusions or known viral hepatitis. Review of symptoms is notable for recent fatigue, insomnia, joint aches principally of her knees and spine.She has intentionally lost some weight.  She apparently was considered for possible GLP 1 therapy but she stated she was not interested in taking additional treatment, preferring instead to diet.Past History (PMH, PSH):   Past Medical History[1]Past Surgical History[2]Social Connections: Not on file Tobacco noneAlcohol 1 drink per monthFamily history no sarcoidosis, no TBMaternal aunt with cirrhosis from hepatitis complicated by hepatomaReview of Systems Constitutional:  Negative for fever. Eyes: Negative.  Respiratory:  Positive for shortness of breath.  Cardiovascular: Negative.  Gastrointestinal:  Negative for blood in stool, heartburn and nausea. Genitourinary: Negative.  Musculoskeletal:  Positive for back pain and joint pain. Skin:  Negative for itching. Neurological:  Negative.  Psychiatric/Behavioral: Negative.    Medications:   Current Medications[3] Objective:   Vitals: BP (!) 145/90 (Site: l a, Position: Sitting, Cuff Size: Medium)  - Pulse 65  - Temp 98.1 ?F (36.7 ?C)  - Ht 5' 3 (1.6 m)  - Wt 74.4 kg  - LMP 08/30/2017 (Exact Date) Comment: hyst 08/30/2017 - SpO2 97%  - BMI 29.05 kg/m? Vitals:  04/02/24 1505 BP: (!) 145/90 Pulse: 65 Temp: 98.1 ?F (36.7 ?C) Last 3 Weights  04/02/24 1505 Weight (lbs): 164 Lbs. Wt Readings from Last 3 Encounters: 04/02/24 74.4 kg 03/15/24 74.3 kg 02/16/24 70.3 kg Physical ExamConstitutional:     Comments: Well-appearing HENT:    Mouth/Throat:    Pharynx: No oropharyngeal exudate. Eyes:    General: No scleral icterus.   Conjunctiva/sclera: Conjunctivae normal. Cardiovascular:    Rate and Rhythm: Normal rate and regular rhythm.    Pulses: Normal pulses.    Heart sounds: Normal heart sounds. Pulmonary:    Effort: Pulmonary effort is normal.    Breath sounds: Normal breath sounds. Abdominal:    General: Bowel sounds are normal. There is no distension.    Palpations: Abdomen is soft.    Tenderness: There is no abdominal tenderness.    Comments: Normal-size and texture of the liver, nontender Musculoskeletal:    Right lower leg: No edema.    Left lower leg: No edema. Lymphadenopathy:    Cervical: No cervical adenopathy. Skin:   General: Skin is warm and dry. Neurological:    General: No focal deficit present.    Mental Status: She is oriented to person, place, and time. Psychiatric:       Mood and Affect: Mood normal.       Thought Content: Thought content normal.  Laboratory results:    Lab Results Component Value Date  WBC 7.5 02/16/2024  HGB 13.3 02/16/2024  HCT 39.90 02/16/2024  MCV 86.0 02/16/2024  PLT 280 02/16/2024 Lab Results Component Value Date  CREATININE 0.54 02/03/2024 BUN 10 02/03/2024  NA 139 02/03/2024  K 4.1 02/03/2024  CL 103 02/03/2024  CO2 24 02/03/2024 Lab Results Component Value Date  ALT 127 (H) 02/13/2024  AST 72 (H) 02/13/2024  GGT 149 (H) 10/11/2023  ALKPHOS 179 (H) 02/13/2024  BILITOT 0.7 02/13/2024 Computed MELD 3.0 unavailable. One or more values for this score either were not found within the given timeframe or did not fit some other criterion.Computed MELD-Na unavailable. One or more values for this score either were not found within the given timeframe or did not fit some other criterion.  Latest Reference Range & Units 08/25/23 09:53 10/11/23 11:40 12/30/23 15:09 02/13/24 09:46 Total Bilirubin <=1.2 mg/dL 0.5 0.4 0.5 0.7 Bilirubin, Direct <=0.2 mg/dL 0.1  0.2 0.2 Bile Acids, Total <=10 umol/L   19 (H)  Alkaline Phosphatase 9 - 122 U/L 176 (H) 167 (H) 172 (H) 179 (H) Alanine Aminotransferase (ALT) 10 - 35 U/L 54 (H) 43 (H) 34 127 (H) Aspartate Aminotransferase (AST) 10 - 35 U/L 53 (H) 47 (H) 42 (H) 72 (H) AST/ALT Ratio Reference Range Not Established  1.0 1.1 1.2 0.6 GGT <48 U/L  149 (H)   Total Protein 5.9 - 8.3 g/dL 8.0 7.6 8.2 7.9 Albumin 3.6 - 5.1 g/dL 4.1 4.1 4.0 3.9 (H): Data is abnormally high Latest Reference Range & Units 10/01/22 08:45 02/08/23 10:34 05/14/23 10:51 05/21/23 10:08 08/10/23 13:37 08/25/23 09:53 Total Bilirubin <=1.2 mg/dL 0.2 0.4 0.4 0.4 0.4 0.5 Bilirubin, Direct <=0.2 mg/dL  0.1 Alkaline Phosphatase 9 - 122 U/L 102 121 147 (H) 135 (H) 153 (H) 176 (H) Alanine Aminotransferase (ALT) 10 - 35 U/L 23 23 41 (H) 35 48 (H) 54 (H) Aspartate Aminotransferase (AST) 10 - 35 U/L 26 36 (H) 34 35 47 (H) 53 (H) AST/ALT Ratio Reference Range Not Established  1.1 1.6 0.8 1.0 1.0 1.0 Total Protein 5.9 - 8.3 g/dL 7.5 7.8 7.6 7.5 7.4 8.0 Albumin 3.6 - 5.1 g/dL 4.0 4.2 4.0 4.0 4.1 4.1 (H): Data is abnormally high Latest Reference Range & Units 10/27/21 11:49 04/20/22 15:52 05/21/23 10:26 10/11/23 11:40 12/30/23 15:09 Immunoglobulin A 70 - 470 mg/dL 543   407 (H) 391 (H) IgM 40 - 230 mg/dL 881   859 856 Immunoglobulin G 700 - 1,600 mg/dL 8,374 (H)   8,351 (H) 8,268 (H) Immunofixation Electrophoresis Gel  Normal immunofixation electrophoresis. No evidence of a serum monoclonal component.     IgG Subclass 4 3.9 - 86.4 mg/dL 66.4   57.2  Creatinine, Urine, Random Reference Range Not Established mg/dL   62   C3 Complement 90 - 180 mg/dL 829 835    C4 Complement 10 - 40 mg/dL 38 38    Rheumatoid Factor <14.0 IU/mL <10.0     ANA <1:80 (Negative)  Positive !     ANA Titer 1 <1:80 (Negative)  >=1:2560 !     ANA Pattern 1  Nuclear Dots     ANA Titer 2 <1:80 (Negative)  1:160 !     ANA Pattern 2  Speckled     CCP Ab, IgG <7.0 EliA U/mL 1.8     Mitochondrial M2 Antibody, IgG <4.0 U/mL     24.0 (H)  Latest Reference Range & Units 10/27/21 11:49 04/20/22 15:52 05/21/23 10:26 10/11/23 11:40 Immunoglobulin A 70 - 470 mg/dL 543   407 (H) IgM 40 - 230 mg/dL 881   859 Immunoglobulin G 700 - 1,600 mg/dL 8,374 (H)   8,351 (H) Immunofixation Electrophoresis Gel  Normal immunofixation electrophoresis. No evidence of a serum monoclonal component.    IgG Subclass 4 3.9 - 86.4 mg/dL 66.4   57.2 Creatinine, Urine, Random Reference Range Not Established mg/dL   62  C3 Complement 90 - 180 mg/dL 829 835   C4 Complement 10 - 40 mg/dL 38 38   Rheumatoid Factor <14.0 IU/mL <10.0    ANA <1:80 (Negative)  Positive !    ANA Titer 1 <1:80 (Negative)  >=1:2560 !    ANA Pattern 1  Nuclear Dots    ANA Titer 2 <1:80 (Negative)  1:160 !    ANA Pattern 2  Speckled     Latest Reference Range & Units 10/11/23 11:40 Hepatitis B Surface Antigen Negative  Negative Hepatitis B Surface Antibody > or = 12 mIU/mL 11.11 Hepatitis B Core IgM Antibody  Negative Hepatitis C Antibody Negative  Negative  Latest Reference Range & Units 10/27/21 11:49 10/01/22 08:45 10/11/23 11:40 Angiotensin Converting Enzyme 12 - 82 U/L 60 66 109 (H) (H): Data is abnormally highGI procedures:   GI procedures/pathologyEUS  MAY 2025LARGE 4CM PORTAL LYMPH NODE:       - SMALL FRAGMENTS OF FIBROTIC TISSUE WITH SEVERAL SMALL NON-NECROTIZING GRANULOMAS. COLON 2019Normal colonic mucosa from the terminal ileum to the rectosigmoid.Protuberant appendiceal orifice..Normal overlying mucosa.No colon polyps elsewhere.No colitis.[place] abdomen pelvis 2019No evidence of any tumor     - FRAGMENTS OF BENIGN GASTRIC MUCOSA Diagnostic Imaging:    No results found.No results found.Liver elastography/FibroScan January 19, 2024 Indication:  Abnormal liver enzymes Previous Biopsy: NoBMI: 27.70 kg/m2NPO X 3 Hours: YesProbe: MMedian Liver Stiffness: 13.2 kPaIQR: 11%Controlled Attenuation Parameter: 215 dB/m Interpretation:Median liver stiffness measurement of 13.2 kPa. These findings are consistent with a high risk of clinically significant hepatic fibrosis, confirmatory testing and hepatology consult recommended.A median controlled attenuation parameter (CAP) measurement of 215 dB/m corresponds to a steatosis score of S0.PET myocardial sarcoid evaluation November 07, 2023  * 1) Normal fasting FDG PET with rest RB-82 myocardial perfusion imaging for the evaluation of cardiac sarcoidosis but with extensive non-cardiac FDG uptake (see below)* 2) There was no myocardial FDG uptake.  Perfusion imaging was normal.* 3) Global left ventricular systolic function was normal and normal regional wall motion.  The left ventricle was normal in size.* 4) [place] performed for attenuation correction showed no significant coronary artery calcifications.  Non-cardiac findings of the [place] are described in a separate report from Radiology.* 5) There is extensive FDG avid lymphadenopathy pericardial, para-aortic, bilateral hilar, subcarinal lymph nodes, as well as extensive nodular FDG abnormalities in the liver (SUVmax 24) and less so in the spleen. In the correct clinical context, these are consistent with active systemic sarcoidosisCT abdomen June 2025Redemonstration of innumerable subcentimeter lesions throughout the liver and spleen, increased in size and number, though comparison is difficult given differences in modalities.Stable periportal/gastrohepatic lymphadenopathy. New mild retroperitoneal lymphadenopathy. [place] ABDOMEN PELVIS W IV CONTRAST on 10/14/2023 7:30 AM INDICATION: liver lesions/lymph nodes. COMPARISON: MRI from 03/29/2023 TECHNIQUE: [place] images of the abdomen and pelvis were obtained after the administration of intravenous contrast. IV contrast administered:  80 ML IOHEXOL  (OMNIPAQUE ) 350 MG IODINE /ML INJECTION FINDINGS: Lung bases: Several sub-4 mm nonspecific nodules in both lung bases. Hepatobiliary: Redemonstration of innumerable subcentimeter lesions throughout the liver which appear increased in size and number, though comparison is difficult given differences in modalities. Pancreas: Unremarkable.  Spleen: Increased subcentimeter splenic hypodensities. Adrenal glands: Unremarkable. Kidneys: Multiple bilateral renal cysts and hypodensities too small to characterize. Punctate nonobstructing left renal stones. Left renal scarring. Bowel: No bowel obstruction. Lymph nodes: Stable periportal/gastrohepatic lymphadenopathy measuring up to 1.6 cm (image 20, series 2). New mild retroperitoneal lymphadenopathy. Peritoneum: No ascites. Vasculature: No abdominal aortic aneurysm. Pelvis: No mass. Osseous structures: No aggressive osseous lesion. IMPRESSION: Redemonstration of innumerable subcentimeter lesions throughout the liver and spleen, increased in size and number, though comparison is difficult given differences in modalities. Stable periportal/gastrohepatic lymphadenopathy. New mild retroperitoneal lymphadenopathy. [place] abdomen pelvis 2019No evidence of any tumor     - FRAGMENTS OF BENIGN GASTRIC MUCOSA [place] abdomen June 2025Redemonstration of innumerable subcentimeter lesions throughout the liver and spleen, increased in size and number, though comparison is difficult given differences in modalities.Stable periportal/gastrohepatic lymphadenopathy. New mild retroperitoneal lymphadenopathy. MRI abdomen 2024Stable benign punctate hepatic cysts/biliary hamartomas. These do not require any routine imaging surveillance.Note:Patient declined intravenous contrast secondary to allergy from prior MRI however this is not documented in the medical record and is not mentioned in last MRi report. If the patient did have a true allergic like reaction to gadolinium based contrast agent, please on the update medical record with this information.[facility] Radiology Notify System:  Routine. ConclusionMRI ABDOMEN WO IV CONTRAST  PROVIDED INDICATION: liver massinnumerable liver nodules seen on [place] A/P; compare with prior MRI abdomen COMPARISON: MRI ABDOMEN W WO IV CONTRAST 2022-01-29; [place] RENAL STONE (HIGHLY LIKELY OR PRIOR STONE, LOW RADIATION) 2022-10-25 Technique: MRI of the abdomen was performed without IV contrast. Patient declined IV contrast use.  FINDINGS:Exams limited due to lack of IV contrast. Again noted  are multiple small T2 hyperintense hepatic lesions which are most consistent with benign cysts or biliary hamartomas. Mild hepatic steatosis is noted. No suspicious liver masses seen on this limited noncontrast exam. Numerous renal cysts are again noted, better assessed on prior contrast-enhanced MRI, some of which are hemorrhagic. None kidney stones are better assessed on prior [place]. Spleen, adrenal glands, pancreas appear unremarkable. A small nonspecific nodule adjacent to the right adrenal gland is unchanged. No fluid collections or adenopathy. No aggressive osseous lesions. IMPRESSION: Stable benign punctate hepatic cysts/biliary hamartomas. These do not require any routine imaging surveillance. Note:Patient declined intravenous contrast secondary to allergy from prior MRI however this is not documented in the medical record and is not mentioned in last MRi report. If the patient did have a true allergic like reaction to gadolinium based contrast agent, please on the update medical record with this information.[place] abdomen pelvis 2018IMPRESSION: 1.  Attending of the gastric antrum and duodenal bulb, possibly reflecting gastritis/duodenitis. An infiltrative process /hypertrophy is also possible. Further evaluation with endoscopic visualization may be considered.2.  Numerous fluid-filled loops of small bowel throughout the abdomen. This is nonspecific, though may be seen in enteritis.3.  The appendix is not definitely visualized. However there are no inflammatory changes in the right lower quadrant to suggest appendicitis.4.  Enlarged  uterus measuring up to 13 cm likely a combination of fibroids and adenomyosis.5.  Numerous cysts in bilateral kidneys consistent with the given history of autosomal dominant polycystic kidney disease.Assessment / Plan :    Treniya Lobb is a 58 y.o. with sarcoidosis of the lungs, adenopathy and likely liver involvement as suggested by the cholestatic LFTs.  While the additional underlying history of prediabetes and elevated BMI raises the possibility of hepatic steatosis, the cholestatic pattern is more typical of sarcoidosis.  Liver FibroScan September 2025 showed minimal steatosis (S0) and notably a fibro score of 13 KPA indicative of moderate to high-risk fibrosis.Cross-sectional imaging revealed subcentimeter hepatic lesions that can be consistent with sarcoidosis.  The biliary tree appeared normal on imaging, ruling out outflow obstruction as the cause for the elevated enzymes.  She has no clinical features of cholangitis or radiologic features of sclerosing cholangitis.Although initially there was understandably some reluctance to start systemic therapy, she recently consented to starting treatment.  Regarding possible autoimmune liver disease, labs include ANA of 160, elevated IgA and borderline elevated IgG along with elevated mitochondrial antibody AMA.  RecommendWe have discussed empiric Ursodiol  and query liver biopsy.Patient Active Problem List  Diagnosis Date Noted  Abnormal liver enzymes 12/30/2023  Type 2 diabetes mellitus without complication, without long-term current use of insulin  (HC CODE) 05/16/2023  Restrictive lung disease 05/02/2023  Sarcoidosis 04/19/2022  Major depressive disorder, recurrent episode, moderate (HC Code) (HC CODE) 04/02/2022  Post-traumatic stress disorder, chronic 04/02/2022  Parotitis 10/13/2021  Mediastinal lymphadenopathy 10/13/2021  Breast pain 10/13/2021  Eosinophilic esophagitis 08/26/2020  Vitamin D deficiency   Chronic kidney disease   Polycystic kidney disease   Lumbar degenerative disc disease 04/19/2016  Lumbar spondylosis 04/19/2016  Chronic bilateral low back pain without sciatica 02/25/2016  Essential hypertension 02/25/2016  MVP (mitral valve prolapse) 02/25/2016  Kidney stone 03/18/2011  Palpitations 03/18/2011 Rodgers Mau MDAssociate Professor of MedicineGastroenterology and Transplant Hepatology [1] Past Medical History:Diagnosis Date  Chronic kidney disease   PKD, but normal creatinine  Diabetes mellitus (HC CODE)   Encounter for blood transfusion   per pt, no rxn  Eosinophilic esophagitis 08/26/2020  GERD (gastroesophageal reflux disease)   History of torn  meniscus of right knee 02/2022 Hypertension   Mitral valve prolapse   Mitral valve regurgitation   Palpitations   Polycystic kidney disease   Sickle cell trait (HC Code)   [place]ater[place] testing 2021  Vitamin D deficiency  [2] Past Surgical History:Procedure Laterality Date  APPENDECTOMY  1984  BRONCHOSCOPY    w/ EBUS  COLONOSCOPY    DILATION AND CURETTAGE OF UTERUS    HYSTERECTOMY  08/30/2017  LAPAROSCOPY    LEEP    TONSILLECTOMY    TUBAL LIGATION    prior to the hysterectomy  UPPER GASTROINTESTINAL ENDOSCOPY   [3] Current Outpatient Medications Medication Sig Dispense Refill  albuterol  sulfate 90 mcg/actuation HFA aerosol inhaler Inhale 1-2 puffs into the lungs every 6 (six) hours as needed for wheezing. 18 g 0  ascorbic acid , vitamin C , (VITAMIN C ) 500 mg tablet 1 tab(s) orally once a day; Duration: 30 day(s)    BLOOD GLUCOSE METER (ONETOUCH ULTRA2 METER) device USE TO CHECK BLOOD SUGAR DAILY E11.9 1 each 0  blood sugar diagnostic (GLUCOSE BLOOD) test strips Testing Frequency: Three times daily DX E11.9. Accucheck test strips. 100 each 3  fluticasone  propion-salmeteroL (ADVAIR DISKUS) 500-50 mcg/dose blister powder for inhalation Inhale 1 puff into the lungs every 12 (twelve) hours. 60 each 11  fluticasone  propionate (FLOVENT  HFA) 110 mcg/actuation inhaler Inhale 2 puffs into the lungs 2 (two) times daily. 12 g 3  predniSONE  (DELTASONE ) 20 mg tablet Take 1 tablet (20 mg total) by mouth daily for 45 days, THEN 0.5 tablets (10 mg total) daily. Take with food. 68 tablet 0  tirzepatide (MOUNJARO) 2.5 mg/0.5 mL Pen Injector INJECT 1 PEN UNDER THE SKIN ONE TIME PER WEEK 0.5 mL 1  ursodioL  (URSO  FORTE) 500 mg tablet Take 1 tablet (500 mg total) by mouth 2 (two) times daily. 60 tablet 5 No current facility-administered medications for this visit.

## 2024-04-09 ENCOUNTER — Encounter
Admit: 2024-04-09 | Payer: PRIVATE HEALTH INSURANCE | Attending: Student in an Organized Health Care Education/Training Program | Primary: Internal Medicine

## 2024-04-09 ENCOUNTER — Inpatient Hospital Stay: Admit: 2024-04-09 | Discharge: 2024-04-09 | Payer: Medicare (Managed Care) | Primary: Internal Medicine

## 2024-04-09 ENCOUNTER — Ambulatory Visit: Admit: 2024-04-09 | Payer: PRIVATE HEALTH INSURANCE | Attending: Cardiovascular Disease | Primary: Internal Medicine

## 2024-04-09 ENCOUNTER — Encounter: Admit: 2024-04-09 | Payer: PRIVATE HEALTH INSURANCE | Attending: Cardiovascular Disease | Primary: Internal Medicine

## 2024-04-09 ENCOUNTER — Encounter: Admit: 2024-04-09 | Payer: PRIVATE HEALTH INSURANCE | Attending: Gastroenterology | Primary: Internal Medicine

## 2024-04-09 VITALS — BP 165/92 | HR 81 | Ht 63.0 in | Wt 167.0 lb

## 2024-04-09 DIAGNOSIS — K219 Gastro-esophageal reflux disease without esophagitis: Secondary | ICD-10-CM

## 2024-04-09 DIAGNOSIS — K831 Obstruction of bile duct: Secondary | ICD-10-CM

## 2024-04-09 DIAGNOSIS — Q613 Polycystic kidney, unspecified: Secondary | ICD-10-CM

## 2024-04-09 DIAGNOSIS — R748 Abnormal levels of other serum enzymes: Secondary | ICD-10-CM

## 2024-04-09 DIAGNOSIS — D869 Sarcoidosis, unspecified: Secondary | ICD-10-CM

## 2024-04-09 DIAGNOSIS — I10 Essential (primary) hypertension: Secondary | ICD-10-CM

## 2024-04-09 DIAGNOSIS — Z5189 Encounter for other specified aftercare: Secondary | ICD-10-CM

## 2024-04-09 DIAGNOSIS — E119 Type 2 diabetes mellitus without complications: Secondary | ICD-10-CM

## 2024-04-09 DIAGNOSIS — N189 Chronic kidney disease, unspecified: Principal | ICD-10-CM

## 2024-04-09 DIAGNOSIS — R002 Palpitations: Secondary | ICD-10-CM

## 2024-04-09 DIAGNOSIS — K753 Granulomatous hepatitis, not elsewhere classified: Secondary | ICD-10-CM

## 2024-04-09 DIAGNOSIS — I341 Nonrheumatic mitral (valve) prolapse: Secondary | ICD-10-CM

## 2024-04-09 DIAGNOSIS — I340 Nonrheumatic mitral (valve) insufficiency: Secondary | ICD-10-CM

## 2024-04-09 DIAGNOSIS — D573 Sickle-cell trait: Secondary | ICD-10-CM

## 2024-04-09 DIAGNOSIS — K200 Eosinophilic esophagitis: Secondary | ICD-10-CM

## 2024-04-09 DIAGNOSIS — Z87828 Personal history of other (healed) physical injury and trauma: Secondary | ICD-10-CM

## 2024-04-09 DIAGNOSIS — E559 Vitamin D deficiency, unspecified: Secondary | ICD-10-CM

## 2024-04-09 LAB — BASIC METABOLIC PANEL
BKR ANION GAP: 12 (ref 7–17)
BKR BLOOD UREA NITROGEN: 9 mg/dL (ref 6–20)
BKR BUN / CREAT RATIO: 16.1 (ref 8.0–23.0)
BKR CALCIUM: 9.9 mg/dL (ref 8.8–10.2)
BKR CHLORIDE: 103 mmol/L (ref 98–107)
BKR CO2: 26 mmol/L (ref 20–30)
BKR CREATININE DELTA: 0.02
BKR CREATININE: 0.56 mg/dL (ref 0.40–1.30)
BKR EGFR, CREATININE (CKD-EPI 2021): >60 mL/min/1.73m2 (ref >=60)
BKR GLUCOSE: 113 mg/dL — ABNORMAL HIGH (ref 70–100)
BKR POTASSIUM: 4.5 mmol/L (ref 3.3–5.3)
BKR SODIUM: 141 mmol/L (ref 136–144)

## 2024-04-09 LAB — HEPATIC FUNCTION PANEL
BKR A/G RATIO: 1.1 (ref 1.0–2.2)
BKR ALANINE AMINOTRANSFERASE (ALT): 26 U/L (ref 10–35)
BKR ALBUMIN: 3.9 g/dL (ref 3.6–5.1)
BKR ALKALINE PHOSPHATASE: 111 U/L (ref 9–122)
BKR ASPARTATE AMINOTRANSFERASE (AST): 17 U/L (ref 10–35)
BKR AST/ALT RATIO: 0.7
BKR BILIRUBIN DIRECT: 0.1 mg/dL (ref <=0.2)
BKR BILIRUBIN TOTAL: 0.3 mg/dL (ref <=1.2)
BKR GLOBULIN: 3.5 g/dL (ref 2.0–3.9)
BKR PROTEIN TOTAL: 7.4 g/dL (ref 5.9–8.3)

## 2024-04-09 LAB — SEDIMENTATION RATE (ESR): BKR SEDIMENTATION RATE, ERYTHROCYTE: 46 mm/h — ABNORMAL HIGH (ref 0–20)

## 2024-04-09 LAB — C-REACTIVE PROTEIN     (CRP): BKR C-REACTIVE PROTEIN, HIGH SENSITIVITY: 8.1 mg/L — ABNORMAL HIGH

## 2024-04-09 NOTE — Patient Instructions [37]
 [name], Cathy Evans changes on my end.Ultrasound of your heart (echocardiogram) and Holter monitor yearly.Blood work to discuss with Dr. Albertus suspect her blood pressure is elevated because of the prednisone .  Please check your blood pressure at home for the next week and send me numbers in 1 week.  We might need to put you on a low dose of blood pressure medication if the blood pressure does not improve or they do not decrease your dose of prednisone .I will see you again in one year or sooner, if changes. Please feel free to call me with questions and/or concerns.-Dr. GImportant reminders:- Results may be provided by Mychart message. Please make sure to check for them.- Please bring an updated list of your medications always.- If you need refills, please call the office a week prior during office hours. Pharmacies don't always contact us  for refills even when they say so.- Please arrive on time (ideally 15 mins prior) to your next appointment. Late arrivals may incur in rescheduling the appointment. - Please make sure to schedule your recommended follow-up.- If you are having an emergency, please call 911. Neither our answering service or MyChart message are to be used for emergencies.- My personal email is not monitored for patient messages.

## 2024-04-09 NOTE — Progress Notes [1]
 [employer] of MedicineSection of Cardiovascular MedicineYale Cardiomyopathy ProgramReferring Provider:Referring MDSubjective: Chief Complaint:   [name] comes in today for follow-up on palpitations, mitral valve prolapse, sarcoidosis screening and dyspnea with exertion.  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  She follows with Dr. Hsiao from Rheumatology.March 03, 2022:  Initial visit.. At that time, patient reported daytime fatigue and SOB.  She had an echocardiogram in March of 2022 showing normal biventricular size and function with LVEF of 69%, elevated right atrial pressure, moderate mitral regurgitation with posteriorly directed jet, and no significant pericardial effusion.  She had an event monitor in March of 2022, showing a short atrial run, PACs, and PVCs, less than 1%.  Eventually, she was transitioned to metoprolol  succinate but felt it had not helped her.  She feels that her sensation of palpitations has changed since last year.  She feels that whenever she experiences nausea and belching, she has sensation of her heart fluttering.  Again, she remains without syncope or near-syncope.  She experiences musculoskeletal chest pain when she lifts things, but no exertional chest pain, pressure, or other anginal equivalents. 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 Degree of MR was slightly better than previous year. Holter monitor showed <1% burden of PVCs/SVE, with some SVT. NT-proBNP was negative. LDL, LP (a) and APoB were elevated. She was started on Crestor  10 mg daily.She was seen on video visit in June 23, 2022.  Felt like palpitations were related to eating foods.  She has stopped her metoprolol  and rosuvastatin .Echocardiogram in March 24, 2023 showed normal biventricular size and function with mild mitral and tricuspid regurgitation, unchanged from prior.  In the past, and an FDG cardiac PET was discussed to assess for inflammation but patient declined.April 11, 2023: Continues to experience palpitations mostly with certain foods.  She did not feel that beta blockers were helpful.  She has had significant weight gain and was trying to increase her physical activity and change her diet to improve her cholesterol.  She remains off rosuvastatin .  Continues to experience dyspnea with exertion.  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  She is currently taking 20 mg daily and she is due for a taper in 3 weeks.April 09, 2024:  In-person yearly visit.  Patient reports having hard time on the prednisone  given insomnia, jittery, and muscle weakness.  She would like to discuss with Dr. Doreen about decreasing her dose of prednisone  sooner.  From the cardiac perspective, she has fleeting palpitations but nothing sustained and nothing that bothers her significantly.  Electrocardiogram today shows sinus rhythm at a heart rate of 78 beats per minute with no ST or T-wave changes suggestive of ischemia or arrhythmia.  She denies near-syncope , syncope, lower extremity edema, or other heart failure symptoms.  Knows that her blood pressure has been elevated since starting the prednisone  taper.Medical History: Reviewed and updated is the past medical history, past surgical history, and past social history.  They are as follows: PMH PSH  She  has a past medical history of Chronic kidney disease, Diabetes mellitus (HC CODE), Encounter for blood transfusion, Eosinophilic esophagitis (08/26/2020), GERD (gastroesophageal reflux disease), History of torn meniscus of right knee (02/2022), Hypertension, Mitral valve prolapse, Mitral valve regurgitation, Palpitations, Polycystic kidney disease, Sickle cell trait (HC Code), and Vitamin D deficiency. She  has a past surgical history that includes Tubal ligation; LEEP; Dilation and curettage of uterus; laparoscopy; Tonsillectomy; Hysterectomy (08/30/2017); Appendectomy (1984); Colonoscopy; Upper gastrointestinal endoscopy; and Bronchoscopy. Social History Family History She  reports that she has never smoked. She has been exposed to tobacco smoke. She has never used smokeless tobacco. She reports that she does not currently use alcohol. She reports that she does not use drugs. Family History Problem Relation Age of Onset  High cholesterol Mother   Hypertension Mother   Stroke Mother   Diabetes Father   Kidney disease Father   Hyperthyroidism Sister   Ovarian cancer Maternal Aunt   Heart attack Paternal Uncle   Breast cancer Maternal Grandmother 40  Diabetes Brother   Hypertension Brother   Diabetes Brother   Asthma Son   Medications Reviewed and updated is the medication list:Current Medications Medication Sig  albuterol  sulfate 90 mcg/actuation HFA aerosol inhaler Inhale 1-2 puffs into the lungs every 6 (six) hours as needed for wheezing.  ascorbic acid , vitamin C , (VITAMIN C ) 500 mg tablet 1 tab(s) orally once a day; Duration: 30 day(s)  BLOOD GLUCOSE METER (ONETOUCH ULTRA2 METER) device USE TO CHECK BLOOD SUGAR DAILY E11.9  blood sugar diagnostic (GLUCOSE BLOOD) test strips Testing Frequency: Three times daily DX E11.9. Accucheck test strips.  fluticasone  propion-salmeteroL (ADVAIR DISKUS) 500-50 mcg/dose blister powder for inhalation Inhale 1 puff into the lungs every 12 (twelve) hours.  fluticasone  propionate (FLOVENT  HFA) 110 mcg/actuation inhaler Inhale 2 puffs into the lungs 2 (two) times daily.  predniSONE  (DELTASONE ) 20 mg tablet Take 1 tablet (20 mg total) by mouth daily for 45 days, THEN 0.5 tablets (10 mg total) daily. Take with food.  tirzepatide (MOUNJARO) 2.5 mg/0.5 mL Pen Injector INJECT 1 PEN UNDER THE SKIN ONE TIME PER WEEK  ursodioL  (URSO  FORTE) 500 mg tablet Take 1 tablet (500 mg total) by mouth 2 (two) times daily.  Allergies She is allergic to penicillins, sulfa (sulfonamide antibiotics), sulfamethoxazole-trimethoprim, sulfasalazine, and moxifloxacin. Review of Systems: ROSThe remainder of the 12 point review of systems was reviewed with the patient and is negative.Objective: Vital Signs:BP (!) 165/92 (Site: r a, Position: Sitting, Cuff Size: Large)  - Pulse 81  - Ht 5' 3 (1.6 m)  - Wt 75.8 kg  - LMP 08/30/2017 (Exact Date) Comment: hyst 08/30/2017 - SpO2 96%  - BMI 29.58 kg/m?  (167 lbs.)Wt Readings from Last 3 Encounters: 04/09/24 75.8 kg 04/02/24 74.4 kg 03/15/24 74.3 kg  Physical Exam: General Appearance:  Alert, cooperative, no distress, appears stated age  Head:  Normocephalic, without obvious abnormality, atraumatic Neck: Supple, symmetrical, trachea midline; no carotid bruit or JVD. JVP appears normal with no abnormal wave forms, Respiratory:   Clear to auscultation bilaterally, respirations unlabored, No rales, wheezes or rhonchi  Cardiovascular:  PMI in 5th interspace MCL, normal S1 and S2, no murmurs, no rubs or gallops  Extremities: No varicosities, no cyanosis or edema, no other lesions  Pulses: 2+ and symmetric all extremities,  pedal pulses 2+ bilaterally  Skin: Skin color, texture, turgor normal, no rashes or lesions  Neurologic/Psychiatric: Mood and affect appropriate, oriented to time/place and person  Musculoskeletal: Gait normal Assessment and Plan: She  is a 58 year old woman with hypertension, palpitations due to PACs, PVCs, SVT (stopped metoprolol  due to feeling it wasn't working), hyperlipidemia, valvular heart disease with mild mitral and tricuspid regurgitation, polycystic kidney disease, eosinophilic esophagitis, and  undifferentiated connective tissue disease, hilar lymphadenopathy with noncaseating granulomatous inflammation in BAL consistent with sarcoidosis.Ms. Rossitto remained stable from the cardiovascular perspective without signs or symptoms of of concern for cardiac sarcoidosis infiltration.  Her electrocardiogram was unremarkable as well.  Therefore, we will continue to screen for LV dysfunction or arrhythmias by yearly echocardiogram and Holter monitor which I have ordered today.Her main symptoms at this time revolved around the use of prednisone  which I have encouraged her to reach out to Dr. Doreen about.  I do notice that her blood pressure is elevated today but she also only slept a couple of hours.  I recommend monitoring her blood pressure at home and sending me a message next week.  If elevated, we will have to consider an antihypertensive while she remains on prednisone .Otherwise, we will contact her with the results of testing and plan going forward.  I will see her again in 1 year, sooner if changes or new symptoms.42 minutes of my time was spent during this evaluation.  This time comprised review of medical records, personal interpretation of ECG and/or diagnostic studies and lab tests,meeting with the patient face to face for a lengthy interview, detailed physical exam, documentation of this visit, written instructions, coordination of care, and discussion with other clinicians as needed. Terrel EMERSON Merck, MD MHSAssistant Professor of Medicine (Cardiology)[place] [facility] ProgramY[facility] and [facility] of MedicineSection of Cardiovascular MedicineNew [place] Office: [phone] Office: [phone] CHRISTELLA Merck Kattan12/05/2023 11:10 AM

## 2024-04-11 ENCOUNTER — Ambulatory Visit: Admit: 2024-04-11 | Payer: PRIVATE HEALTH INSURANCE | Attending: Cardiovascular Disease | Primary: Internal Medicine

## 2024-04-14 ENCOUNTER — Encounter
Admit: 2024-04-14 | Payer: PRIVATE HEALTH INSURANCE | Attending: Student in an Organized Health Care Education/Training Program | Primary: Internal Medicine

## 2024-04-17 ENCOUNTER — Inpatient Hospital Stay: Admit: 2024-04-17 | Discharge: 2024-04-17 | Payer: PRIVATE HEALTH INSURANCE | Primary: Internal Medicine

## 2024-04-17 DIAGNOSIS — D869 Sarcoidosis, unspecified: Secondary | ICD-10-CM

## 2024-04-17 DIAGNOSIS — R002 Palpitations: Secondary | ICD-10-CM

## 2024-04-26 ENCOUNTER — Ambulatory Visit
Admit: 2024-04-26 | Payer: PRIVATE HEALTH INSURANCE | Attending: Student in an Organized Health Care Education/Training Program | Primary: Internal Medicine

## 2024-04-26 ENCOUNTER — Encounter
Admit: 2024-04-26 | Payer: PRIVATE HEALTH INSURANCE | Attending: Student in an Organized Health Care Education/Training Program | Primary: Internal Medicine

## 2024-04-26 DIAGNOSIS — D869 Sarcoidosis, unspecified: Secondary | ICD-10-CM

## 2024-04-26 NOTE — Progress Notes [1]
 TELEPHONE VISIT: For this visit the clinician and patient were present via telephone (audio only).Patient counseled on available options for visit type; Patient elected telephone visit (audio only); Patient consent given for telephone (audio only) visit : YesState patient is located in: CTThe clinician is appropriately licensed in the above state to provide care for this visitPatient Identity was confirmed during this call.  Other individuals actively participating in the telephone encounter and their name/relation to the patient: noneGreater than 10 minutes of time was spent with this patient in medical discussion to support the telephone (audio only) visit: Yes The level of service was determined by medical decision making (MDM).Because this visit was completed over telephone, a hands-on physical exam was not performed.  Patient understands and knows to call back if condition changes.[facility], Section of Pulmonary, Critical Care, and Sleep Medicine	[facility] [address];  [place], [place] [address]E[place]:  [phone]  Fax: [phone]				Director, Edsel Mays, MD			Associate Director, Dennard Lockwood, MD, [facility] Georgiann Alamo, MD, MScAlexander Ridgeley, MDErica Whitman, MD, PhDJean Deward Square, MDGenta Ishikawa, MD, MPHChangwan Arnoldo Luster, MD, MPHJonathan Siner, MDRachel Marine, PALisa Rebmann, RNChristine Tancreti, RNPatient Name:  Cathy Evans of Birth:  11/02/1967Date of Service: 12/18/2025EPIC MRN:  [id number] Provider:  Gaylia BROCKS Cerro-Chiang, MDHPI:She was found to have mediastinal adenopathies on [place] chest as part of the workup for a neck mass. 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 At our first visit, she had some CTD related symptoms as well as palpitations. She had a Holter monitor for 2 weeks which was unrevealing. 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 Given increased nodularity on [place] scan and worsening symptoms we discussed the role of treatment in March but she declined treatment. LAST VISIT 03/15/2024:Since last visit, Avaleigh reports that after she took prednisone  for 7 days the cough got better, and even her breathing improved for a couple of days, however she is now persistently winded again. Due to evidence of active sarcoid, started prendisone 20mg  for 6 weeks followed by 10mg  for 6 weeks and repeat [place] in 3 months to evaluate her parenchymaINTERIM HISTORY:04/02/24 hepatology: discussed empiric ursodiol  and liver biopsy12/1/25 Cards [facility]): stable with no concern for cardiac sarcoid, will obtain echo and holter to screen for LV dysfunction. MCM with side effects from prednisone , decreased to 10mg  CURRENT VISIT 04/26/2024:Since decreasing prednisone , side effects are better, MEDICATIONS: Current Outpatient Medications:   albuterol  sulfate, Inhale 1-2 puffs into the lungs every 6 (six) hours as needed for wheezing.  ascorbic acid  (vitamin C ), 1 tab(s) orally once a day; Duration: 30 day(s)  OneTouch Ultra2 Meter, USE TO CHECK BLOOD SUGAR DAILY E11.9  glucose blood, Testing Frequency: Three times daily DX E11.9. Accucheck test strips.  fluticasone  propion-salmeteroL, Inhale 1 puff into the lungs every 12 (twelve) hours.  fluticasone  propionate, Inhale 2 puffs into the lungs 2 (two) times daily.  predniSONE , Take 1 tablet (20 mg total) by mouth daily for 45 days, THEN 0.5 tablets (10 mg total) daily. Take with food.  Mounjaro, INJECT 1 PEN UNDER THE SKIN ONE TIME PER WEEK  ursodioL , Take 1 tablet (500 mg total) by mouth 2 (two) times daily.ALLERGIES:Allergies[1]PAST MEDICAL HISTORY:Past Medical History: Diagnosis Date  Chronic kidney disease   PKD, but normal creatinine  Diabetes mellitus (HC CODE)   Encounter for blood transfusion   per pt, no rxn  Eosinophilic esophagitis 08/26/2020  GERD (gastroesophageal reflux disease)   History of torn meniscus of  right knee 02/2022  Hypertension   Mitral valve prolapse   Mitral valve regurgitation   Palpitations   Polycystic kidney disease   Sickle cell trait (HC Code)   [place]ater[place] testing 2021  Vitamin D deficiency  Past Surgical History: Procedure Laterality Date  APPENDECTOMY  1984  BRONCHOSCOPY    w/ EBUS  COLONOSCOPY    DILATION AND CURETTAGE OF UTERUS    HYSTERECTOMY  08/30/2017  LAPAROSCOPY    LEEP    TONSILLECTOMY    TUBAL LIGATION    prior to the hysterectomy  UPPER GASTROINTESTINAL ENDOSCOPY   PHYSICAL EXAMINATION:Deferred due to TAVREVIEW OF DATA:Pulmonary Function Testing: Date FVC FEV1 FEV1/FVC TLC DLCO  01/21/2022 2.46 (73%) 1.96 (73%) 80% 3.44 (66%) 15.95 (78%) 02/04/23 2.02 (65) 1.62 (64) 80% 3.35 (64) 16.33 (81) 06/09/23 2.17 (71) 1.62 (66) 75% 2.85 (56) 17.06 (85) 02/02/24 2.17 (73) 1.51 (63) 70%   15.58 (79)  Personal Interpretation:  No obstruction, moderate restriction, normal DLCO. FVC and DLCO are improved Chest Radiology: 10/18/21 [place] ChestLungs/Airways/Pleura: Central tracheobronchial tree is patent. Minimal tree-in-bud and groundglass opacities in the right lower lobe, likely infectious or inflammatory etiology (4:367). No reticulations, bronchiectasis, honeycombing, architectural distortion or other evidence of interstitial lung disease. No suspicious pulmonary nodules or masses. No pleural effusion or pneumothorax.Mediastinum/Lymph nodes: Left subclavicular, superior mediastinal, right paratracheal, prevascular, bilateral hilar and subcarinal adenopathy. Largest nodes measure up to 14 mm in the subcarinal region. [place] Chest 4/22/24Slight increase in the size of mediastinal adenopathy compared to study from June 2023. No change in lung findings including a few perilymphatic nodules. No significant bronchiectasis or architectural distortion in the lungs.  HRCT - 07/03/23 - Worsening pleuropulmonary findings related to known sarcoidosis with no true parenchymal progression. Unchanged thoracic adenopathy.  Other Radiology/Studies:[place] AP 6/23/23The liver, gallbladder, spleen, pancreas, and adrenal glands are unremarkable, except for tiny hepatic cysts. Again seen are multiple bilateral renal cysts and hypodensities too small to characterize, including a 2.9 cm lesion in the right kidney that likely represents a cyst with new hemorrhagic/proteinaceous debris (image 39, series 2) that has increased from 2.0 cm. There is no bowel obstruction, ascites, or lymphadenopathy. No aggressive osseous lesions are seen.  Pathology:7/11/23Lung, right mainstem, endobronchial biopsy:       - Superficial fragments of bronchial mucosa with no significant abnormality.  FNA lung1.  RIGHT MIDDLE LOBE BRONCHOALVEOLAR LAVAGE:      - NEGATIVE FOR MALIGNANT CELLS.      - REACTIVE PULMONARY MACROPHAGES.      - GMS STAIN IS NEGATIVE FOR FUNGUS/PJP.      - AFB STAIN IS NEGATIVE FOR MYCOBACTERIA. 2.  LYMPH NODE, 4R, FINE NEEDLE ASPIRATION/WASH:      - NEGATIVE FOR MALIGNANT CELLS.           - REACTIVE LYMPHOCYTES AND FOCAL LYMPHOHISTIOCYTIC AGGREGATES. 3.  LYMPH NODE, 10R, FINE NEEDLE ASPIRATION/WASH:      - NEGATIVE FOR MALIGNANT CELLS.           - REACTIVE LYMPHOCYTES AND FOCAL LYMPHOHISTIOCYTIC AGGREGATES. 4.  LYMPH NODE, 4L, FINE NEEDLE ASPIRATION/WASH:      - NEGATIVE FOR MALIGNANT CELLS.      - FOCAL NON-CASEATING GRANULOMATOUS INFLAMMATION.      - GMS STAIN IS NEGATIVE FOR FUNGUS/PJP.      - AFB STAIN IS NEGATIVE FOR MYCOBACTERIA.  Cardiac studies:07/11/20 * Normal left ventricular size, wall thickness, systolic function and wall motion. LVEF calculated by 3DE was 69%.  Diastolic function was difficult to determine due  to mitral valve abnormalities.* Normal right ventricular cavity size and systolic function.  Right ventricular systolic pressure is unable to be estimated due to insufficient Doppler signal.* Atria are normal in size.  No interatrial shunt by color Doppler.  Interatrial septum is bowed toward the left, consistent with elevated right atrial pressure.* Normal mitral valve leaflets.  Moderate posteriorly directed mitral regurgitation. By PISA, EROA PISA is 0.24 cm2 and regurgitant volume 46 ml.* All visible segments of the aorta are normal in size.* IVC diameter < 2.1 cm that collapses > 50% with a sniff suggests normal RAP (0-5 mmHg, mean 3 mmHg). Echo 03/24/23 * Normal left ventricular size, systolic function and wall motion. Mild concentric left ventricular hypertrophy.  LVEF calculated by 3DE was 63%.  Normal global longitudinal strain (-21%).  Normal diastolic function and filling pressures.* Normal right ventricular cavity size and systolic function.  Estimated right ventricular systolic pressure is 26 mmHg.* Mild mitral regurgitation.* Mild tricuspid regurgitation.* Normal sinuses of Valsalva with a diameter of 2.9 cm and normal ascending aorta with a diameter of 3.0 cm.* IVC diameter < 2.1 cm that collapses > 50% with a sniff suggests normal RAP (0-5 mmHg, mean 3 mmHg). 11/07/23 Myocardial PET  * 1) Normal fasting FDG PET with rest RB-82 myocardial perfusion imaging for the evaluation of cardiac sarcoidosis but with extensive non-cardiac FDG uptake (see below)* 2) There was no myocardial FDG uptake.  Perfusion imaging was normal.* 3) Global left ventricular systolic function was normal and normal regional wall motion.  The left ventricle was normal in size.* 4) [place] performed for attenuation correction showed no significant coronary artery calcifications.  Non-cardiac findings of the [place] are described in a separate report from Radiology. Labs:10/2021:CRP 5.1, ESR 34Neg ACE, RFNormal complementIgG: 1625, remainder unremarkableANA > 1:2560 (nuclear dots) 1:160 speckled. DsDNA, RNP, SSA, SSB, Smith, all negative 12/2019ANA     1:1280 nuclear, multiple nuclear dotsdsDNA Negative Scl-70  Negative SSA     NegativeSSB     NegativeJo-1     NegativeCCP     <16RF IgM            10ANCA  NegativeSm Ab NegativeMito Ab            Negative C3        161C4        42 6/3/25ACE 109IgG4 42.7CRP 13.2GGT 149IgG 1648IgA 592Alk phos 167ALT 43AST 47ESR 61CK 54 IMPRESSION:Soliana Gluth is a 58 y.o. 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 She has parenchymal findings of sarcoidosis as well as worsening symptoms that improved with prednisone   RECOMMENDATIONS:SarcoidosisPathology: EBUS with FNA showing non caseating granulomasOrgan involvement: LADsPrimary target organ for therapy: none currentlyCurrent treatment: nonePast treatments: noneFurther work-up:Continue ICS/LABA today for reactive airway disease likely secondary to viral infection. Continue prendisone 10mg , repeat [place] scan late January for further tapering. If improvement could conside decreasing to 5mg  for 6 weeks then stop. Follow up liver workup, if sarcoid present in liver, will defer to Dr Meliton need for treatment for liver sarcAnnual vitamin D levels. Continue  daily sinus rinses followed by flonase  Please do not hesitate to call me if you would like to discuss Ms. Taylor's care.Sincerely,Nea Gittens Georgiann Alamo, MD, MScAssistant ProfessorYale Interstitial Lung Disease CenterSection of Pulmonary and Critical Care MedicineYale School of MedicineToday's visit required high medical decision making based on complexity of  illness and need for ongoing monitoring.  [1] AllergiesAllergen Reactions  Penicillins Hives   Hives Has patient had a PCN reaction causing immediate rash, facial/tongue/throat swelling, SOB or lightheadedness with hypotension: YESHas patient had a PCN reaction causing severe rash involving mucus membranes or skin necrosis: NOHas patient had a PCN reaction that required hospitalization NOHas patient had a PCN reaction occurring within the last 10 years: NOIf all of the above answers are NO, then may proceed with Cephalosporin use.  Sulfa (Sulfonamide Antibiotics) Hives and Rash  Sulfamethoxazole-Trimethoprim Hives  Sulfasalazine Hives   rashrash  Moxifloxacin Rash

## 2024-05-01 ENCOUNTER — Encounter: Admit: 2024-05-01 | Payer: PRIVATE HEALTH INSURANCE | Attending: Rheumatology | Primary: Internal Medicine

## 2024-05-01 ENCOUNTER — Encounter: Admit: 2024-05-01 | Payer: PRIVATE HEALTH INSURANCE | Attending: Internal Medicine | Primary: Internal Medicine

## 2024-05-01 VITALS — Ht 63.0 in | Wt 168.0 lb

## 2024-05-01 DIAGNOSIS — R002 Palpitations: Secondary | ICD-10-CM

## 2024-05-01 DIAGNOSIS — Z7952 Long term (current) use of systemic steroids: Secondary | ICD-10-CM

## 2024-05-01 DIAGNOSIS — Q613 Polycystic kidney, unspecified: Secondary | ICD-10-CM

## 2024-05-01 DIAGNOSIS — E559 Vitamin D deficiency, unspecified: Secondary | ICD-10-CM

## 2024-05-01 DIAGNOSIS — I10 Essential (primary) hypertension: Secondary | ICD-10-CM

## 2024-05-01 DIAGNOSIS — E119 Type 2 diabetes mellitus without complications: Secondary | ICD-10-CM

## 2024-05-01 DIAGNOSIS — D573 Sickle-cell trait: Secondary | ICD-10-CM

## 2024-05-01 DIAGNOSIS — I341 Nonrheumatic mitral (valve) prolapse: Secondary | ICD-10-CM

## 2024-05-01 DIAGNOSIS — K219 Gastro-esophageal reflux disease without esophagitis: Secondary | ICD-10-CM

## 2024-05-01 DIAGNOSIS — Z87828 Personal history of other (healed) physical injury and trauma: Secondary | ICD-10-CM

## 2024-05-01 DIAGNOSIS — N189 Chronic kidney disease, unspecified: Principal | ICD-10-CM

## 2024-05-01 DIAGNOSIS — Z5189 Encounter for other specified aftercare: Secondary | ICD-10-CM

## 2024-05-01 DIAGNOSIS — I340 Nonrheumatic mitral (valve) insufficiency: Secondary | ICD-10-CM

## 2024-05-01 DIAGNOSIS — K200 Eosinophilic esophagitis: Secondary | ICD-10-CM

## 2024-05-01 DIAGNOSIS — D869 Sarcoidosis, unspecified: Principal | ICD-10-CM

## 2024-05-01 NOTE — Patient Instructions [37]
 I will touch base with your lung and liver doctors.

## 2024-05-01 NOTE — Result Encounter Note [77]
 Spoke w/ PT regarding Holter monitor results. Pt reported continuing unchanged palpitations. Stated certain foods seem to aggravate sensations. Stated she has been speaking to PCP regarding sx.

## 2024-05-01 NOTE — Result Encounter Note [77]
 Helping to cover for Dr. KANDICE. Holter monitor results reviewed.  Predominant rhythm was sinus rhythm with rare extra heartbeats noted and brief nonsustained runs of extra heartbeats from the top 2 chambers and bottom 2 chambers.  There were no critical sustained arrhythmias or significant pauses noted.  Patient triggered events appear to be associated with sinus rhythm or extra heartbeats.  Would keep medications the same at this time and will have Dr. KANDICE review upon her return to the clinic.  Keep follow-up visit as scheduled.

## 2024-05-02 ENCOUNTER — Ambulatory Visit: Admit: 2024-05-02 | Payer: PRIVATE HEALTH INSURANCE | Primary: Internal Medicine

## 2024-05-07 ENCOUNTER — Encounter: Admit: 2024-05-07 | Payer: PRIVATE HEALTH INSURANCE | Attending: Gastroenterology | Primary: Internal Medicine

## 2024-05-07 VITALS — Ht 63.0 in | Wt 168.0 lb

## 2024-05-07 DIAGNOSIS — E119 Type 2 diabetes mellitus without complications: Secondary | ICD-10-CM

## 2024-05-07 DIAGNOSIS — Z5189 Encounter for other specified aftercare: Secondary | ICD-10-CM

## 2024-05-07 DIAGNOSIS — R002 Palpitations: Secondary | ICD-10-CM

## 2024-05-07 DIAGNOSIS — D573 Sickle-cell trait: Secondary | ICD-10-CM

## 2024-05-07 DIAGNOSIS — K219 Gastro-esophageal reflux disease without esophagitis: Secondary | ICD-10-CM

## 2024-05-07 DIAGNOSIS — K200 Eosinophilic esophagitis: Secondary | ICD-10-CM

## 2024-05-07 DIAGNOSIS — Z87828 Personal history of other (healed) physical injury and trauma: Secondary | ICD-10-CM

## 2024-05-07 DIAGNOSIS — I10 Essential (primary) hypertension: Secondary | ICD-10-CM

## 2024-05-07 DIAGNOSIS — I341 Nonrheumatic mitral (valve) prolapse: Secondary | ICD-10-CM

## 2024-05-07 DIAGNOSIS — Q613 Polycystic kidney, unspecified: Secondary | ICD-10-CM

## 2024-05-07 DIAGNOSIS — R7989 Other specified abnormal findings of blood chemistry: Principal | ICD-10-CM

## 2024-05-07 DIAGNOSIS — E559 Vitamin D deficiency, unspecified: Secondary | ICD-10-CM

## 2024-05-07 DIAGNOSIS — N189 Chronic kidney disease, unspecified: Principal | ICD-10-CM

## 2024-05-07 DIAGNOSIS — I340 Nonrheumatic mitral (valve) insufficiency: Secondary | ICD-10-CM

## 2024-05-07 NOTE — Progress Notes [1]
 Review of Systems Constitutional: Negative.  HENT: Negative.   Eyes: Negative.  Respiratory: Negative.   Cardiovascular: Negative.  Gastrointestinal:       Doing ok Endocrine: Negative.  Allergic/Immunologic: Negative.  Neurological: Negative.  Hematological: Negative.  Psychiatric/Behavioral: Negative.   All other systems reviewed and are negative.

## 2024-05-07 NOTE — Progress Notes [1]
 [facility] RheumatologyDiagnosis: Undifferentiated Connective Tissue Disease /SarcoidSerologies: +ANA 1:2560 nuclear dots; negative Sm, RNP, SSA, SSB, dsDNA, LA not detected, B2GP, ACL Ab negative, CCP negative, RF negativeManifestations: alopecia, arthralgias, dry eyes, dry mouthClinical course: FNA 11/2021 of lymph node taken during bronchoscopy showed non-caseating granulomas inflammation; biopsy of portal lymph node 09/2023 showed non-necrotizing granulomasPast medications: hydroxychloroquine  200 mg BIDCurrent therapy: prednisone  10mg /dayHPI from 08/2018:Ms Hoen presents for initial visit, never saw a rheumatologist in the past.Claude Paradis is a 58 y.o. female with PMHx notable for HTN, polycystic kidney disease, who presents for initial visit. She was referred by her gastroenterologist Dr. Lorenso for concern of positive ANA, found as part of evaluation for dysphagia. She has underwent EGD and colonoscopy fall 2019 but notes symptoms persist despite initiation of PPI. She reports fatigue, hair thinning, episode of oral ulcers, along with arthralgia and myalgia, pregnancy loss at 3 months and possible issue with her platelets in the past. 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 Pt encouraged to use MyChart to let us  know if she has any questions or concerns.Interval Change:--Seen 12/2018, we discussed how her presentation may be suggestive of an evolving UCTD for which we discussed starting HCQ (AEs discussed) to which she agreed. She will start HCQ 200 mg daily and schedule Ophthalmology visit with [facility]. She will return for followup in 3 months or earlier PRN.--Seen 05/22/2019, on HCQ 200 mg daily that was started after her last visit, noting overall improvement, particularly in her MSK symptoms. She still endorses sicca, hair thinning, and fatigue; she describes some mild hand symptoms that are suggestive of inflammatory arthritis. We discussed continuing to monitor on HCQ 200 mg daily--she is up to date on Ophthalmology evaluation and has VF testing next week. We will see her for followup in 3-4 months.  --Seen 08/28/2019, on HCQ 200 mg daily, noting some MSK symptoms that are seem more mechanical in etiology. She also continues to have hair thinning, dry mouth, and some dysphagia. Her last GI visit was in early 2020 after endoscopies in 2019; I encouraged her to followup with GI prior to considering barium swallow study. At this time, we discussed scheduling an in-person visit and will plan to repeat labs before her followup in 3-4 months. 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 --Seen 12/03/2021, for her first in-person visit with me. She was started on HCQ but returns today after she self-DCd one year prior. Recently, clinical course c/b parotitis and thoracic adenopathy, now undergoing [place] abdomen/pelvis and evaluation by Oncology. Mammogram and ultrasound notable for BI-RAD2. She also describes fatigue, sicca, dysphagia, cramping abdominal pain. At this time, we discussed awaiting possible biopsy for further elucidation of underlying process--we will touch base after workup for thoracic adenopathy. In the interim, we will repeat serologies. We will hold on resuming HCQ for now. --Seen 02/11/2022, with palpitations and going to see Cardiology next month--also recommended to schedule PET myocardial for sarcoid involvement. She continues to have some SOB and noted to have restriction on PFTs concerning for neuromuscular involvement so myositis panel was ordered. At this time, we discussed how it is unclear if her palpitations are related to sarcoid and we will await additional workup. We will also check muscle enzymes along with monitoring labs. In the interim, she was encouraged to restart HCQ, which she is not taking. --Seen 05/17/2022, noting  ongoing palpitations s/p Cardiology evaluation, noted to unlikely to have cardiac sarcoid given no evidence of conduction disease, arrhythmia, or syncope/heart failure symptoms. She last saw Pulmonary 04/2022, recommended for PFTs in one year, with no indication for treatment. She is having fatigue. She notes MSK symptoms in her left mid-back that is suggestive of mechanical etiologies, other joints are overall stable. She is also using topical treatment for rash.  At this time, she was encouraged to schedule followup with Dermatology to evaluate for active skin in the setting of sarcoid, as there is no indication for immunosuppression in the setting of pulmonary and cardiac systems. Her MSK symptoms seem more from mechanical etiologies (normal CK, aldolase, ESR 21, CRP <10) for which we will start trial of diclofenac  topical. We will continue to monitor clinically--Seen 09/16/2022, noting SOB upon exertion, s/p repeat [place] chest 08/2022 that showed hilar adenopathy but no change in lung findings, Pulmonary team recommended treatment for post nasal drip, no systemic treatment for sarcoid. She continues to have low back pain despite Tylenol  PRN, cyclobenzaprine  PRN. She had worsened symptoms with PT and now referred to aquatherapy. Other joints okay. At this time, we discussed how her low back symptoms are most likely mechanical but will proceed with MRI lumbar spine to exclude sarcoid involvement--we will also repeat labs this summer. Otherwise, no indication for immunosuppression for her pulmonary and cardiac systems. --Seen 03/31/2023, noting ongoing back pain for which she will see Spine Surgery. She also has continued chest tightness despite PFTs showing normal DLCO and [facility] imaging that did not show CAC. She has recently underwent MRI abdomen without contrast that demonstrated hepatic lesions most c/w benign cyst versus hamartoma, mild hepatic steatosis, no suspicious masses or adenopathy. At this time, we discussed how she fortunately has not needed immunosuppression for sarcoid as her back symptoms are in the setting of DDD. She was encouraged to schedule followup with Pulmonary. We will repeat monitoring labs before her next visit.  --Seen 09/29/2023, noted to have worsening pleuropulmonary findings on [place] as well as PFTs showing restriction, reduced lung volumes. Plan was to trial prednisone  but she never started. She also has been having elevated liver enzymes since 05/2023, s/p RUQ U/S which showed peripancreatic mass, suggesting a peripancreatic/periportal lymph node. She underwent EUS and biopsy of portal lymph node 09/2023 showed non-necrotizing granulomas. She continues to have SOB upon exertion s/p cardiac workup that has included Holter monitoring (PACs, PVCs, SVT) but declined additional imaging in the past. We discussed assessing for cardiac involvement with PET [place] sarcoid to which she is now agreeable. We will then discuss with Pulmonary, GI, and Cardiology next steps as she will likely need to start immunosuppression. --Seen 01/19/2024, underwent EUS and biopsy of portal lymph node 09/2023 showed non-necrotizing granulomas, now evaluated by GI/Hepatology with overall concern for hepatic sarcoid, planned for fibroscan today. At this time, we discussed how her fatigue and respiratory symptoms are likely in the setting of active sarcoid--her PET myocardial sarcoid fortunately did not demonstrated FDG uptake in the myocardium but did show avid LAD in the lymph nodes, liver and spleen. We discussed a course of prednisone  that was previously recommended by Pulmonary which she will think over. She has been experiencing left LE discomfort that is predominantly in the area of her knee that has persisted >8 weeks despite conservative treatment (OTC analgesics, PT) so we will proceed with MRI as xray imaging was largely unremarkable. We will also followup pending results including fibroscan.Today:She was started on prednisone   20 mg daily but had increased jitteriness, so decreased to 10mg  daily. She notes improvement in breathing. She has palpitations s/p Holter monitor. She has noticed improvement on lower dose of prednisone . Her knee is improved, no longer having pain. She has some weakness in her legs. She saw Dr. Meliton with discussion of liver biopsy. She is taking ursodiol  twice daily.Review of Systems (negative except as noted; positive findings bold)Constitutional: weight changes, fatigue, malaise, fever, chills, sweatsSkin: rashes, photosensitivity, hives, easy bruisability, alopecia, scalp tenderness, skin nodules or psoriasis Eyes:  pain, redness, itching, visual blurring, dryness, foreign body sensationENT: tinnitus, hearing loss, sinus congestion, loss of smell, dry nose, bloody nose, jaw claudication, oral ulcers, loss of taste, dry mouth, hoarsenessCardiac: chest pain, palpitations, irregular heartbeat, exertional dyspneaVascular: Raynaud?s, chilblains, frostbite, venous stasis, thrombosisPulmonary: shortness of breath, cough, wheeze, hemoptysis, chest wall pain, pleuritic chest pain, current tobacco use, GI: difficulty swallowing, nausea, vomiting, GERD, ulcers, constipation, diarrhea, change in bowel habits, abdominal pain, liver diseaseGU: genital ulcers, dysuria, blood in urine, nocturia, infections, kidney stonesMusculoskeletal: morning stiffness, neck pain, back pain, joint pain, joint swelling, muscle aching or tenderness, muscle weakness, chest wall tightnessNeurological: numbness, tingling, headaches, fainting, dizziness, imbalance, memory loss, seizure, strokeHeme/Lymph: swollen or tender glands, anemiaPsychiatric:  anxiety, irritability, depression, sleep disturbancePast Medical History:Past Medical History: Diagnosis Date  Chronic kidney disease   PKD, but normal creatinine  Diabetes mellitus (HC CODE) Encounter for blood transfusion   per pt, no rxn  Eosinophilic esophagitis 08/26/2020  GERD (gastroesophageal reflux disease)   History of torn meniscus of right knee 02/2022  Hypertension   Mitral valve prolapse   Mitral valve regurgitation   Palpitations   Polycystic kidney disease   Sickle cell trait (HC Code)   Natera testing 2021  Vitamin D deficiency  Past Surgical History: Procedure Laterality Date  APPENDECTOMY  1984  BRONCHOSCOPY    w/ EBUS  COLONOSCOPY    DILATION AND CURETTAGE OF UTERUS    HYSTERECTOMY  08/30/2017  LAPAROSCOPY    LEEP    TONSILLECTOMY    TUBAL LIGATION    prior to the hysterectomy  UPPER GASTROINTESTINAL ENDOSCOPY   Allergy:Allergies Allergen Reactions  Penicillins Hives   Hives Has patient had a PCN reaction causing immediate rash, facial/tongue/throat swelling, SOB or lightheadedness with hypotension: YESHas patient had a PCN reaction causing severe rash involving mucus membranes or skin necrosis: NOHas patient had a PCN reaction that required hospitalization NOHas patient had a PCN reaction occurring within the last 10 years: NOIf all of the above answers are NO, then may proceed with Cephalosporin use.  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  ascorbic acid  (vitamin C ) 1 tab(s) orally once a day; Duration: 30 day(s)  OneTouch Ultra2 Meter USE TO CHECK BLOOD SUGAR DAILY E11.9  glucose blood Testing Frequency: Three times daily DX E11.9. Accucheck test strips.  fluticasone  propion-salmeteroL Inhale 1 puff into the lungs every 12 (twelve) hours.  fluticasone  propionate Inhale 2 puffs into the lungs 2 (two) times daily.  predniSONE  Take 1 tablet (20 mg total) by mouth daily for 45 days, THEN 0.5 tablets (10 mg total) daily. Take with food.  Mounjaro INJECT  1 PEN UNDER THE SKIN ONE TIME PER WEEK  ursodioL  Take 1 tablet (500 mg total) by mouth 2 (two) times daily. No current facility-administered medications for this visit. Physical ExaminationLMP 08/30/2017 (Exact Date) Comment: hyst 4/23/2019General: female patient in no acute distress, limping due to left LE painHEENT: no scleral icterus; no conjunctival injectionsNeck: supple; no cervical or supraclavicular lymphadenopathyCVS: regular rate and rhythm and S1, S2 Respiratory: grossly clear to auscultation bilaterallyAbdomen: non-distendedExtremities: no edemaMSK:upper extremity joints are normal without synovitis, enthesitis, dactylitis, or limitation in ROMNo pain upon ROM of hipsWarmth over left knee with fullness, tenderness over joint lines5/5 strength Back: no tenderness to palpation over vertebraeSkin: skin color, texture, turgor normal; fading rash over anterior chestNeuro: alert and oriented; motor strength symmetrical proximally and distally in upper and lower extremitiesPsychiatric: mood/affect appropriate; well-kempt Lab and Imaging ResultsLab Results Component Value Date  WBC 7.5 02/16/2024  HGB 13.3 02/16/2024  HCT 39.90 02/16/2024  MCV 86.0 02/16/2024  PLT 280 02/16/2024 Lab Results Component Value Date  CREATININE 0.56 04/09/2024  BUN 9 04/09/2024  NA 141 04/09/2024  K 4.5 04/09/2024  CL 103 04/09/2024  CO2 26 04/09/2024 Lab Results Component Value Date  ALT 26 04/09/2024  AST 17 04/09/2024  GGT 149 (H) 10/11/2023 ALKPHOS 111 04/09/2024  BILITOT 0.3 04/09/2024  12/2019ANA	1:1280 nuclear, multiple nuclear dotsdsDNA	Negative Scl-70 	Negative SSA	NegativeSSB	NegativeJo-1 	NegativeCCP 	<16RF IgM 	10ANCA	NegativeSm Ab	NegativeMito Ab	Negative C3	161C4	4212/19/2019 [place] abdomen pelvisThe liver, gallbladder, spleen, pancreas, and adrenal glands are unremarkable, except for tiny hepatic cysts. There are multiple bilateral renal cysts and hypodensities too small to characterize, including a 2.0 cm lesion in the right kidney that likely represents a cyst with new hemorrhagic/proteinaceous debris (image 35, series 2). There is no bowel obstruction, ascites, or lymphadenopathy. No aggressive osseous lesions are seen.IMPRESSION: No evidence of metastatic disease.08/30/2022 [place] chestSlight increase in the size of mediastinal adenopathy compared to study from June 2023. No change in lung findings including a few perilymphatic nodules. No significant bronchiectasis or architectural distortion in the lungs. 06/09/2023 PFTsSpirometry is suggestive of restriction. Severely reducced lung volumes, normal diffusing capacity. 07/03/2023 [place] chestWorsening pleuropulmonary findings related to known sarcoidosis.Unchanged thoracic adenopathy.10/14/2023 [place] abdomen/pelvisRedemonstration of innumerable subcentimeter lesions throughout the liver and spleen, increased in size and number, though comparison is difficult given differences in modalities.Stable periportal/gastrohepatic lymphadenopathy. New mild retroperitoneal lymphadenopathy.11/07/2023 PET [place] Myocardial Sarcoid Evaluation * 1) Normal fasting FDG PET with rest RB-82 myocardial perfusion imaging for the evaluation of cardiac sarcoidosis but with extensive non-cardiac FDG uptake (see below)* 2) There was no myocardial FDG uptake.  Perfusion imaging was normal.* 3) Global left ventricular systolic function was normal and normal regional wall motion.  The left ventricle was normal in size.* 4) [place] performed for attenuation correction showed no significant coronary artery calcifications.  Non-cardiac findings of the [place] are described in a separate report from Radiology.* 5) There is extensive FDG avid lymphadenopathy pericardial, para-aortic, bilateral hilar, subcarinal lymph nodes, as well as extensive nodular FDG abnormalities in the liver (SUVmax 24) and less so in the spleen. In the correct clinical context, these are consistent with active systemic sarcoidosis. SummaryMerashe Colaizzi is a 58 y.o. female with PMHx notable for HTN, polycystic kidney disease, who presents for followup of UCTD (ANA >=1:2560); thoracic adenopathy s/p biopsy that showed non-caseating granulomatous inflammation without parenchymal involvement c/w sarcoid. She returns today after being started on prednisone  by Pulmonary, currently on 10mg /day tapered from 20mg /day, noting improvement in breathing. She has also been seen in GI, noted to have fibroscan 01/2024 that showed  kPa of 13, suggesting moderate to high risk fibrosis, with cross-sectional imaging hepatic lesions concerning for sarcoid. She is on ursodiol  and plan is to consider liver biopsy. She underwent repeat liver enzymes in 04/2024 that showed normal AST, ALT. At this time, we discussed how she may need a steroid sparing agent in the future, which we will discuss with Pulmonary, Hepatology.RecommendationsUCTDSarcoidosis (biopsy proven with involvement of lymph nodes, likely liver)Continue prednisone  10mg /day as per pulmonaryLeft knee pain and swelling since 11/2023--no improvement despite home exercise/PT, improvedPatient education/counseling RE: UCTD, sarcoidFollow up: The patient knows to call with any concerns before the next appointment. Next visit: 6 monthsBetty Smitty Ackerley, MDYale Rheumatology Kayhan [email or web address]/23/2025

## 2024-05-07 NOTE — Progress Notes [1]
 [facility] HepatologyDavid Henderson, MD Ophelia Reese, MD Marina  Madelynn, MD Francisca Dessert, MD Derrek Dunnings, MD Bette Squire, MD Delon Iba, MD Sinclair Mcardle, MD Achille Jetty, MD Lynwood Poli, MD Elna Pacas, MD Roma Fisherman, MD Rodgers Mau, MD Janetta Harding, MD Auston Hug, MD Clem Has, MD Eather Human, MD Mickel Lipps, APRN Sherline Miners, MD Ozell Bloodgood, MD [facility] APRN Ephriam Bring, MD Ozell Bottom, MD Deland Sor, PA-C    Patient Name: Cathy Evans                DOB: 11-27-1965              MRN: [id number]   12/29/2025Erroneous encounter.  Patient not available when physician attempted to reach out.Electronically Signed by Rodgers Mau, MD, May 07, 2024

## 2024-05-07 NOTE — Progress Notes [1]
 Review of Systems Constitutional:  Negative for fever. HENT:  Negative for congestion.  Respiratory:  Negative for cough.  Cardiovascular:  Negative for chest pain. Gastrointestinal:  Negative for abdominal pain. Neurological:  Negative for dizziness.

## 2024-05-09 ENCOUNTER — Inpatient Hospital Stay: Admit: 2024-05-09 | Discharge: 2024-05-09 | Payer: PRIVATE HEALTH INSURANCE | Primary: Internal Medicine

## 2024-05-09 DIAGNOSIS — R002 Palpitations: Secondary | ICD-10-CM

## 2024-05-09 DIAGNOSIS — D869 Sarcoidosis, unspecified: Principal | ICD-10-CM

## 2024-05-11 NOTE — Result Encounter Note [77]
 Echocardiogram stable.

## 2024-05-13 ENCOUNTER — Encounter: Admit: 2024-05-13 | Payer: PRIVATE HEALTH INSURANCE | Attending: Gastroenterology | Primary: Internal Medicine

## 2024-05-14 MED ORDER — URSODIOL 500 MG TABLET
500 | ORAL_TABLET | Freq: Two times a day (BID) | ORAL | 2 refills | 30.00000 days | Status: AC
Start: 2024-05-14 — End: ?

## 2024-05-29 ENCOUNTER — Inpatient Hospital Stay: Admit: 2024-05-29 | Discharge: 2024-05-29 | Payer: Medicare (Managed Care) | Primary: Internal Medicine

## 2024-05-29 DIAGNOSIS — K753 Granulomatous hepatitis, not elsewhere classified: Secondary | ICD-10-CM

## 2024-05-29 DIAGNOSIS — D869 Sarcoidosis, unspecified: Principal | ICD-10-CM

## 2024-05-29 DIAGNOSIS — K831 Obstruction of bile duct: Secondary | ICD-10-CM

## 2024-05-29 LAB — HEPATIC FUNCTION PANEL
BKR A/G RATIO: 1.1 (ref 1.0–2.2)
BKR ALANINE AMINOTRANSFERASE (ALT): 22 U/L (ref 10–35)
BKR ALBUMIN: 4.1 g/dL (ref 3.6–5.1)
BKR ALKALINE PHOSPHATASE: 88 U/L (ref 9–122)
BKR ASPARTATE AMINOTRANSFERASE (AST): 23 U/L (ref 10–35)
BKR AST/ALT RATIO: 1
BKR BILIRUBIN DIRECT: 0.1 mg/dL (ref <=0.2)
BKR BILIRUBIN TOTAL: 0.4 mg/dL (ref <=1.2)
BKR GLOBULIN: 3.6 g/dL (ref 2.0–3.9)
BKR PROTEIN TOTAL: 7.7 g/dL (ref 5.9–8.3)

## 2024-05-30 ENCOUNTER — Encounter: Admit: 2024-05-30 | Payer: Medicare (Managed Care) | Primary: Internal Medicine

## 2024-05-30 ENCOUNTER — Ambulatory Visit: Admit: 2024-05-30 | Payer: PRIVATE HEALTH INSURANCE | Attending: Ophthalmology | Primary: Internal Medicine

## 2024-05-30 DIAGNOSIS — E119 Type 2 diabetes mellitus without complications: Principal | ICD-10-CM

## 2024-05-30 DIAGNOSIS — D869 Sarcoidosis, unspecified: Secondary | ICD-10-CM

## 2024-05-30 NOTE — Progress Notes [1]
 Cathy Evans is a 59 y.o. woman.  . Consult requested by Dr.  Ellard  for diabetic eye exam. Pertinent PMH: (+) DM, (+) HTN, (-) MI, (-) Stroke(-) Plavix or Coumadin(-) Sleep Apnea, (-) No COPD (-) asthma (-) dialysis New patient to me 05/30/2024#Type II DM Lab Results Component Value Date  HGBA1C 5.8 (H) 02/13/2024  HGBA1C 6.6 (H) 05/14/2023  HGBA1C 6.1 (H) 06/25/2022 - on Mounjaro - diagnosed approximately 2 years ago - no retinopathy on exam - -  glycemic/blood pressure/lipid control- discussed that systemic control of the above is required to prevent vision loss- we also discussed that diabetic retinopathy is a chronic condition that requires diligent followup#drusen and RPE changes, right eye -  no family history of age-related macular degeneration - drusen and RPE changes noted right eye - discussed these are early changes and would monitor - would not meet criteria for AREDS2, consider amsler  grid monitoring #posterior vitreous detachment, right eye - retina flat and attached - Retinal detachment precautions discussed at length/retinal detachment #sarcoidosis/ connective tissue diease  - follows with Dr. Dickey Bones - on prednisone  10mg / day - prior history of plaquenil , no longer taking #dry eye - start artificial tears - start lid scrubs and warm compresses #refractive error, both eyes - follow with optometry [facility] optometry here  for annual exam For questions, please page ophthalmology at [phone] Charmian Forbis, MDDepartment of Ophthalmology and Visual Sciences 05/30/2024 at 8:23 AM

## 2024-05-30 NOTE — Patient Instructions [37]
 If there are new symptoms (pain, redness, vision decline, sensitivity to light, flashes, new floaters, curtains, or other symptoms),  please call the office at [phone] to see an eye provider, otherwise immediately or report to the emergency department in unable to be seen in the office. Start artificial tears (also called ocular lubricant) 1 drop 4 times a day for both eyes. Avoid products that say anything about red eye relief or that contain astringents, as these will make your eye dependent on them to stay white if used too often. The brand of artificial tear does not matter to [place].Also start using warm compresses:Take hand/face towel and put under hot waterWring out the waterLie down, close eyes and place towel over eyeWhen cool, remove towelDo this 2-4 times a day

## 2024-05-31 ENCOUNTER — Inpatient Hospital Stay: Admit: 2024-05-31 | Discharge: 2024-05-31 | Payer: PRIVATE HEALTH INSURANCE | Primary: Internal Medicine

## 2024-05-31 DIAGNOSIS — D869 Sarcoidosis, unspecified: Secondary | ICD-10-CM

## 2024-06-08 ENCOUNTER — Encounter: Admit: 2024-06-08 | Payer: Medicare (Managed Care) | Attending: Gastroenterology | Primary: Internal Medicine

## 2024-06-12 ENCOUNTER — Encounter: Admit: 2024-06-12 | Payer: PRIVATE HEALTH INSURANCE | Primary: Internal Medicine

## 2024-06-13 ENCOUNTER — Ambulatory Visit
Admit: 2024-06-13 | Payer: PRIVATE HEALTH INSURANCE | Attending: Student in an Organized Health Care Education/Training Program | Primary: Internal Medicine

## 2024-06-13 ENCOUNTER — Inpatient Hospital Stay: Admit: 2024-06-13 | Payer: PRIVATE HEALTH INSURANCE | Primary: Internal Medicine

## 2024-06-13 DIAGNOSIS — D869 Sarcoidosis, unspecified: Principal | ICD-10-CM

## 2024-06-13 MED ORDER — PREDNISONE 5 MG TABLET
5 | ORAL_TABLET | Freq: Every day | ORAL | 2 refills | Status: AC
Start: 2024-06-13 — End: ?

## 2024-06-13 NOTE — Patient Instructions [37]
 It was a pleasure to meet you in clinic today.  Today we discussed your diagnosis of sarcoidosisThe immediate plan includes the following studies and/or referrals:Decrease prednisone  to 5mg  for 6 weeks and then stop on 3/18.Please keep me updated on any changes in your breathing and any acute illnesses, particularly if hospitalization is required. Our goal at the [facility] is to provide you comprehensive care and support from diagnosis onward.[facility] for Lung Disease: [phone]: [phone] (to schedule [place] scans): [phone]

## 2024-06-14 ENCOUNTER — Telehealth: Admit: 2024-06-14 | Payer: PRIVATE HEALTH INSURANCE | Attending: Gastroenterology | Primary: Internal Medicine

## 2024-06-14 NOTE — Telephone Encounter [36]
 Patient missed her last appointment as she had trouble with the [facility] and logged in late. The soonest we can schedule is August but patient states she needs to be seen sooner and she is requesting a THV.

## 2024-06-22 ENCOUNTER — Encounter: Admit: 2024-06-22 | Payer: PRIVATE HEALTH INSURANCE | Primary: Internal Medicine

## 2024-09-10 ENCOUNTER — Ambulatory Visit: Admit: 2024-09-10 | Payer: PRIVATE HEALTH INSURANCE | Primary: Internal Medicine

## 2024-09-10 ENCOUNTER — Ambulatory Visit
Admit: 2024-09-10 | Payer: PRIVATE HEALTH INSURANCE | Attending: Student in an Organized Health Care Education/Training Program | Primary: Internal Medicine

## 2024-11-06 ENCOUNTER — Encounter: Admit: 2024-11-06 | Payer: PRIVATE HEALTH INSURANCE | Attending: Rheumatology | Primary: Internal Medicine
